# Patient Record
Sex: Female | Born: 1975
Health system: Southern US, Community
[De-identification: ages and names within clinical notes are randomized; demographics above are authoritative.]

## PROBLEM LIST (undated history)

## (undated) DIAGNOSIS — R7303 Prediabetes: Secondary | ICD-10-CM

## (undated) DIAGNOSIS — J309 Allergic rhinitis, unspecified: Secondary | ICD-10-CM

## (undated) DIAGNOSIS — K219 Gastro-esophageal reflux disease without esophagitis: Secondary | ICD-10-CM

## (undated) DIAGNOSIS — G4730 Sleep apnea, unspecified: Secondary | ICD-10-CM

## (undated) DIAGNOSIS — F419 Anxiety disorder, unspecified: Secondary | ICD-10-CM

## (undated) DIAGNOSIS — M3500 Sicca syndrome, unspecified: Secondary | ICD-10-CM

## (undated) DIAGNOSIS — A6000 Herpesviral infection of urogenital system, unspecified: Secondary | ICD-10-CM

## (undated) DIAGNOSIS — D649 Anemia, unspecified: Secondary | ICD-10-CM

## (undated) HISTORY — DX: Anemia, unspecified: D64.9

## (undated) HISTORY — DX: Allergic rhinitis, unspecified: J30.9

## (undated) HISTORY — DX: Anxiety disorder, unspecified: F41.9

## (undated) HISTORY — DX: Sleep apnea, unspecified: G47.30

## (undated) HISTORY — PX: APPENDECTOMY: SHX54

## (undated) HISTORY — DX: Gastro-esophageal reflux disease without esophagitis: K21.9

## (undated) HISTORY — DX: Prediabetes: R73.03

## (undated) HISTORY — DX: Sjogren syndrome, unspecified: M35.00

---

## 2014-02-19 DIAGNOSIS — R55 Syncope and collapse: Secondary | ICD-10-CM | POA: Insufficient documentation

## 2016-07-08 DIAGNOSIS — Z8249 Family history of ischemic heart disease and other diseases of the circulatory system: Secondary | ICD-10-CM | POA: Insufficient documentation

## 2016-07-08 DIAGNOSIS — R7302 Impaired glucose tolerance (oral): Secondary | ICD-10-CM | POA: Insufficient documentation

## 2016-09-17 DIAGNOSIS — R079 Chest pain, unspecified: Secondary | ICD-10-CM | POA: Insufficient documentation

## 2016-09-17 DIAGNOSIS — D649 Anemia, unspecified: Secondary | ICD-10-CM | POA: Diagnosis not present

## 2016-09-17 DIAGNOSIS — R030 Elevated blood-pressure reading, without diagnosis of hypertension: Secondary | ICD-10-CM | POA: Insufficient documentation

## 2016-09-17 DIAGNOSIS — N920 Excessive and frequent menstruation with regular cycle: Secondary | ICD-10-CM | POA: Diagnosis not present

## 2016-09-17 DIAGNOSIS — D500 Iron deficiency anemia secondary to blood loss (chronic): Secondary | ICD-10-CM | POA: Diagnosis not present

## 2016-10-07 DIAGNOSIS — F902 Attention-deficit hyperactivity disorder, combined type: Secondary | ICD-10-CM | POA: Diagnosis not present

## 2016-10-07 DIAGNOSIS — F341 Dysthymic disorder: Secondary | ICD-10-CM | POA: Diagnosis not present

## 2016-10-28 DIAGNOSIS — J019 Acute sinusitis, unspecified: Secondary | ICD-10-CM | POA: Diagnosis not present

## 2016-11-02 DIAGNOSIS — F902 Attention-deficit hyperactivity disorder, combined type: Secondary | ICD-10-CM | POA: Diagnosis not present

## 2016-11-02 DIAGNOSIS — F341 Dysthymic disorder: Secondary | ICD-10-CM | POA: Diagnosis not present

## 2016-11-17 DIAGNOSIS — J321 Chronic frontal sinusitis: Secondary | ICD-10-CM | POA: Diagnosis not present

## 2016-11-17 DIAGNOSIS — J323 Chronic sphenoidal sinusitis: Secondary | ICD-10-CM | POA: Diagnosis not present

## 2016-11-17 DIAGNOSIS — J322 Chronic ethmoidal sinusitis: Secondary | ICD-10-CM | POA: Diagnosis not present

## 2016-11-17 DIAGNOSIS — J320 Chronic maxillary sinusitis: Secondary | ICD-10-CM | POA: Diagnosis not present

## 2016-11-23 DIAGNOSIS — N920 Excessive and frequent menstruation with regular cycle: Secondary | ICD-10-CM | POA: Diagnosis not present

## 2016-11-23 DIAGNOSIS — D500 Iron deficiency anemia secondary to blood loss (chronic): Secondary | ICD-10-CM | POA: Diagnosis not present

## 2016-11-23 DIAGNOSIS — Z30430 Encounter for insertion of intrauterine contraceptive device: Secondary | ICD-10-CM | POA: Diagnosis not present

## 2016-11-23 DIAGNOSIS — Z3202 Encounter for pregnancy test, result negative: Secondary | ICD-10-CM | POA: Diagnosis not present

## 2017-01-06 DIAGNOSIS — T8339XA Other mechanical complication of intrauterine contraceptive device, initial encounter: Secondary | ICD-10-CM | POA: Diagnosis not present

## 2017-01-06 DIAGNOSIS — N938 Other specified abnormal uterine and vaginal bleeding: Secondary | ICD-10-CM | POA: Diagnosis not present

## 2017-01-25 DIAGNOSIS — Z975 Presence of (intrauterine) contraceptive device: Secondary | ICD-10-CM | POA: Diagnosis not present

## 2017-01-25 DIAGNOSIS — N920 Excessive and frequent menstruation with regular cycle: Secondary | ICD-10-CM | POA: Diagnosis not present

## 2017-01-25 DIAGNOSIS — D500 Iron deficiency anemia secondary to blood loss (chronic): Secondary | ICD-10-CM | POA: Diagnosis not present

## 2017-02-08 DIAGNOSIS — F902 Attention-deficit hyperactivity disorder, combined type: Secondary | ICD-10-CM | POA: Diagnosis not present

## 2017-02-08 DIAGNOSIS — F341 Dysthymic disorder: Secondary | ICD-10-CM | POA: Diagnosis not present

## 2017-02-16 DIAGNOSIS — F321 Major depressive disorder, single episode, moderate: Secondary | ICD-10-CM | POA: Diagnosis not present

## 2017-03-04 DIAGNOSIS — F341 Dysthymic disorder: Secondary | ICD-10-CM | POA: Diagnosis not present

## 2017-03-04 DIAGNOSIS — F902 Attention-deficit hyperactivity disorder, combined type: Secondary | ICD-10-CM | POA: Diagnosis not present

## 2017-03-22 DIAGNOSIS — R197 Diarrhea, unspecified: Secondary | ICD-10-CM | POA: Diagnosis not present

## 2017-03-22 DIAGNOSIS — R1084 Generalized abdominal pain: Secondary | ICD-10-CM | POA: Diagnosis not present

## 2017-03-22 DIAGNOSIS — K900 Celiac disease: Secondary | ICD-10-CM | POA: Diagnosis not present

## 2017-03-22 DIAGNOSIS — R1013 Epigastric pain: Secondary | ICD-10-CM | POA: Diagnosis not present

## 2017-03-31 DIAGNOSIS — F321 Major depressive disorder, single episode, moderate: Secondary | ICD-10-CM | POA: Diagnosis not present

## 2017-04-07 DIAGNOSIS — Z8249 Family history of ischemic heart disease and other diseases of the circulatory system: Secondary | ICD-10-CM | POA: Diagnosis not present

## 2017-04-07 DIAGNOSIS — R0600 Dyspnea, unspecified: Secondary | ICD-10-CM | POA: Insufficient documentation

## 2017-04-07 DIAGNOSIS — R079 Chest pain, unspecified: Secondary | ICD-10-CM | POA: Diagnosis not present

## 2017-04-07 DIAGNOSIS — I10 Essential (primary) hypertension: Secondary | ICD-10-CM | POA: Diagnosis not present

## 2017-04-07 DIAGNOSIS — R0609 Other forms of dyspnea: Secondary | ICD-10-CM | POA: Insufficient documentation

## 2017-04-07 DIAGNOSIS — E6609 Other obesity due to excess calories: Secondary | ICD-10-CM | POA: Diagnosis not present

## 2017-04-08 DIAGNOSIS — F900 Attention-deficit hyperactivity disorder, predominantly inattentive type: Secondary | ICD-10-CM | POA: Diagnosis not present

## 2017-04-08 DIAGNOSIS — F341 Dysthymic disorder: Secondary | ICD-10-CM | POA: Diagnosis not present

## 2017-04-13 DIAGNOSIS — F321 Major depressive disorder, single episode, moderate: Secondary | ICD-10-CM | POA: Diagnosis not present

## 2017-04-14 DIAGNOSIS — G4733 Obstructive sleep apnea (adult) (pediatric): Secondary | ICD-10-CM | POA: Diagnosis not present

## 2017-04-18 DIAGNOSIS — Z8249 Family history of ischemic heart disease and other diseases of the circulatory system: Secondary | ICD-10-CM | POA: Diagnosis not present

## 2017-04-18 DIAGNOSIS — R079 Chest pain, unspecified: Secondary | ICD-10-CM | POA: Diagnosis not present

## 2017-04-18 DIAGNOSIS — R0609 Other forms of dyspnea: Secondary | ICD-10-CM | POA: Diagnosis not present

## 2017-04-18 DIAGNOSIS — I10 Essential (primary) hypertension: Secondary | ICD-10-CM | POA: Diagnosis not present

## 2017-04-22 DIAGNOSIS — F321 Major depressive disorder, single episode, moderate: Secondary | ICD-10-CM | POA: Diagnosis not present

## 2017-04-26 DIAGNOSIS — Z8249 Family history of ischemic heart disease and other diseases of the circulatory system: Secondary | ICD-10-CM | POA: Diagnosis not present

## 2017-04-26 DIAGNOSIS — F321 Major depressive disorder, single episode, moderate: Secondary | ICD-10-CM | POA: Diagnosis not present

## 2017-04-26 DIAGNOSIS — R072 Precordial pain: Secondary | ICD-10-CM | POA: Diagnosis not present

## 2017-04-26 DIAGNOSIS — R7302 Impaired glucose tolerance (oral): Secondary | ICD-10-CM | POA: Diagnosis not present

## 2017-04-26 DIAGNOSIS — R9439 Abnormal result of other cardiovascular function study: Secondary | ICD-10-CM | POA: Insufficient documentation

## 2017-04-26 DIAGNOSIS — I10 Essential (primary) hypertension: Secondary | ICD-10-CM | POA: Diagnosis not present

## 2017-04-28 DIAGNOSIS — I10 Essential (primary) hypertension: Secondary | ICD-10-CM | POA: Diagnosis not present

## 2017-04-28 DIAGNOSIS — R0789 Other chest pain: Secondary | ICD-10-CM | POA: Diagnosis not present

## 2017-04-28 DIAGNOSIS — E6609 Other obesity due to excess calories: Secondary | ICD-10-CM | POA: Diagnosis not present

## 2017-04-28 DIAGNOSIS — R002 Palpitations: Secondary | ICD-10-CM | POA: Diagnosis not present

## 2017-05-02 DIAGNOSIS — F321 Major depressive disorder, single episode, moderate: Secondary | ICD-10-CM | POA: Diagnosis not present

## 2017-05-06 DIAGNOSIS — K2960 Other gastritis without bleeding: Secondary | ICD-10-CM | POA: Diagnosis not present

## 2017-05-06 DIAGNOSIS — Z9049 Acquired absence of other specified parts of digestive tract: Secondary | ICD-10-CM | POA: Diagnosis not present

## 2017-05-06 DIAGNOSIS — R079 Chest pain, unspecified: Secondary | ICD-10-CM | POA: Diagnosis not present

## 2017-05-06 DIAGNOSIS — E6609 Other obesity due to excess calories: Secondary | ICD-10-CM | POA: Diagnosis not present

## 2017-05-06 DIAGNOSIS — R5383 Other fatigue: Secondary | ICD-10-CM | POA: Diagnosis not present

## 2017-05-06 DIAGNOSIS — Z975 Presence of (intrauterine) contraceptive device: Secondary | ICD-10-CM | POA: Diagnosis not present

## 2017-05-06 DIAGNOSIS — R0789 Other chest pain: Secondary | ICD-10-CM | POA: Diagnosis not present

## 2017-05-06 DIAGNOSIS — K900 Celiac disease: Secondary | ICD-10-CM | POA: Diagnosis not present

## 2017-05-06 DIAGNOSIS — E8881 Metabolic syndrome: Secondary | ICD-10-CM | POA: Diagnosis not present

## 2017-05-06 DIAGNOSIS — Z6841 Body Mass Index (BMI) 40.0 and over, adult: Secondary | ICD-10-CM | POA: Diagnosis not present

## 2017-05-06 DIAGNOSIS — R002 Palpitations: Secondary | ICD-10-CM | POA: Diagnosis not present

## 2017-05-06 DIAGNOSIS — R030 Elevated blood-pressure reading, without diagnosis of hypertension: Secondary | ICD-10-CM | POA: Diagnosis not present

## 2017-05-06 DIAGNOSIS — I10 Essential (primary) hypertension: Secondary | ICD-10-CM | POA: Diagnosis not present

## 2017-05-06 DIAGNOSIS — R6884 Jaw pain: Secondary | ICD-10-CM | POA: Diagnosis not present

## 2017-05-06 DIAGNOSIS — F419 Anxiety disorder, unspecified: Secondary | ICD-10-CM | POA: Diagnosis not present

## 2017-05-06 DIAGNOSIS — K219 Gastro-esophageal reflux disease without esophagitis: Secondary | ICD-10-CM | POA: Diagnosis not present

## 2017-05-06 DIAGNOSIS — R0602 Shortness of breath: Secondary | ICD-10-CM | POA: Diagnosis not present

## 2017-05-13 DIAGNOSIS — F321 Major depressive disorder, single episode, moderate: Secondary | ICD-10-CM | POA: Diagnosis not present

## 2017-05-20 DIAGNOSIS — F321 Major depressive disorder, single episode, moderate: Secondary | ICD-10-CM | POA: Diagnosis not present

## 2017-05-30 DIAGNOSIS — F321 Major depressive disorder, single episode, moderate: Secondary | ICD-10-CM | POA: Diagnosis not present

## 2017-07-25 ENCOUNTER — Encounter: Payer: Self-pay | Admitting: Family Medicine

## 2017-07-25 DIAGNOSIS — Z0000 Encounter for general adult medical examination without abnormal findings: Secondary | ICD-10-CM | POA: Diagnosis not present

## 2017-07-25 DIAGNOSIS — N921 Excessive and frequent menstruation with irregular cycle: Secondary | ICD-10-CM | POA: Diagnosis not present

## 2017-07-25 DIAGNOSIS — Z01419 Encounter for gynecological examination (general) (routine) without abnormal findings: Secondary | ICD-10-CM | POA: Diagnosis not present

## 2017-07-25 DIAGNOSIS — Z6841 Body Mass Index (BMI) 40.0 and over, adult: Secondary | ICD-10-CM | POA: Diagnosis not present

## 2017-07-27 LAB — HM PAP SMEAR: HM Pap smear: NORMAL

## 2017-08-01 DIAGNOSIS — F321 Major depressive disorder, single episode, moderate: Secondary | ICD-10-CM | POA: Diagnosis not present

## 2017-08-02 DIAGNOSIS — M7631 Iliotibial band syndrome, right leg: Secondary | ICD-10-CM | POA: Diagnosis not present

## 2017-08-02 DIAGNOSIS — M7061 Trochanteric bursitis, right hip: Secondary | ICD-10-CM | POA: Diagnosis not present

## 2017-08-02 DIAGNOSIS — M25551 Pain in right hip: Secondary | ICD-10-CM | POA: Diagnosis not present

## 2017-08-03 DIAGNOSIS — M7631 Iliotibial band syndrome, right leg: Secondary | ICD-10-CM | POA: Diagnosis not present

## 2017-08-05 DIAGNOSIS — Z713 Dietary counseling and surveillance: Secondary | ICD-10-CM | POA: Diagnosis not present

## 2017-08-08 DIAGNOSIS — M7061 Trochanteric bursitis, right hip: Secondary | ICD-10-CM | POA: Diagnosis not present

## 2017-08-08 DIAGNOSIS — M25551 Pain in right hip: Secondary | ICD-10-CM | POA: Diagnosis not present

## 2017-09-07 DIAGNOSIS — M545 Low back pain: Secondary | ICD-10-CM | POA: Diagnosis not present

## 2017-09-07 DIAGNOSIS — M9913 Subluxation complex (vertebral) of lumbar region: Secondary | ICD-10-CM | POA: Diagnosis not present

## 2017-09-07 DIAGNOSIS — M62830 Muscle spasm of back: Secondary | ICD-10-CM | POA: Diagnosis not present

## 2017-09-07 DIAGNOSIS — M9902 Segmental and somatic dysfunction of thoracic region: Secondary | ICD-10-CM | POA: Diagnosis not present

## 2017-09-09 DIAGNOSIS — M62830 Muscle spasm of back: Secondary | ICD-10-CM | POA: Diagnosis not present

## 2017-09-09 DIAGNOSIS — M9913 Subluxation complex (vertebral) of lumbar region: Secondary | ICD-10-CM | POA: Diagnosis not present

## 2017-09-09 DIAGNOSIS — M9902 Segmental and somatic dysfunction of thoracic region: Secondary | ICD-10-CM | POA: Diagnosis not present

## 2017-09-09 DIAGNOSIS — M545 Low back pain: Secondary | ICD-10-CM | POA: Diagnosis not present

## 2017-09-12 DIAGNOSIS — M545 Low back pain: Secondary | ICD-10-CM | POA: Diagnosis not present

## 2017-09-12 DIAGNOSIS — M9902 Segmental and somatic dysfunction of thoracic region: Secondary | ICD-10-CM | POA: Diagnosis not present

## 2017-09-12 DIAGNOSIS — M9913 Subluxation complex (vertebral) of lumbar region: Secondary | ICD-10-CM | POA: Diagnosis not present

## 2017-09-12 DIAGNOSIS — M62830 Muscle spasm of back: Secondary | ICD-10-CM | POA: Diagnosis not present

## 2017-09-13 DIAGNOSIS — M62830 Muscle spasm of back: Secondary | ICD-10-CM | POA: Diagnosis not present

## 2017-09-13 DIAGNOSIS — M9902 Segmental and somatic dysfunction of thoracic region: Secondary | ICD-10-CM | POA: Diagnosis not present

## 2017-09-13 DIAGNOSIS — M9913 Subluxation complex (vertebral) of lumbar region: Secondary | ICD-10-CM | POA: Diagnosis not present

## 2017-09-13 DIAGNOSIS — M545 Low back pain: Secondary | ICD-10-CM | POA: Diagnosis not present

## 2017-09-14 DIAGNOSIS — M9902 Segmental and somatic dysfunction of thoracic region: Secondary | ICD-10-CM | POA: Diagnosis not present

## 2017-09-14 DIAGNOSIS — M9913 Subluxation complex (vertebral) of lumbar region: Secondary | ICD-10-CM | POA: Diagnosis not present

## 2017-09-14 DIAGNOSIS — M545 Low back pain: Secondary | ICD-10-CM | POA: Diagnosis not present

## 2017-09-14 DIAGNOSIS — M62830 Muscle spasm of back: Secondary | ICD-10-CM | POA: Diagnosis not present

## 2017-10-29 DIAGNOSIS — F321 Major depressive disorder, single episode, moderate: Secondary | ICD-10-CM | POA: Diagnosis not present

## 2017-11-10 ENCOUNTER — Encounter: Payer: Self-pay | Admitting: Family Medicine

## 2017-11-10 DIAGNOSIS — D259 Leiomyoma of uterus, unspecified: Secondary | ICD-10-CM | POA: Diagnosis not present

## 2017-11-10 DIAGNOSIS — N921 Excessive and frequent menstruation with irregular cycle: Secondary | ICD-10-CM | POA: Diagnosis not present

## 2017-11-10 DIAGNOSIS — D251 Intramural leiomyoma of uterus: Secondary | ICD-10-CM | POA: Diagnosis not present

## 2017-11-10 DIAGNOSIS — N938 Other specified abnormal uterine and vaginal bleeding: Secondary | ICD-10-CM | POA: Diagnosis not present

## 2017-11-10 DIAGNOSIS — T8339XA Other mechanical complication of intrauterine contraceptive device, initial encounter: Secondary | ICD-10-CM | POA: Diagnosis not present

## 2017-12-15 DIAGNOSIS — K080 Exfoliation of teeth due to systemic causes: Secondary | ICD-10-CM | POA: Diagnosis not present

## 2018-01-05 DIAGNOSIS — R002 Palpitations: Secondary | ICD-10-CM | POA: Diagnosis not present

## 2018-01-05 DIAGNOSIS — K900 Celiac disease: Secondary | ICD-10-CM | POA: Diagnosis not present

## 2018-01-05 DIAGNOSIS — R0789 Other chest pain: Secondary | ICD-10-CM | POA: Diagnosis not present

## 2018-01-05 DIAGNOSIS — R1084 Generalized abdominal pain: Secondary | ICD-10-CM | POA: Diagnosis not present

## 2018-01-05 DIAGNOSIS — I10 Essential (primary) hypertension: Secondary | ICD-10-CM | POA: Diagnosis not present

## 2018-01-05 DIAGNOSIS — R079 Chest pain, unspecified: Secondary | ICD-10-CM | POA: Diagnosis not present

## 2018-01-05 DIAGNOSIS — R140 Abdominal distension (gaseous): Secondary | ICD-10-CM | POA: Diagnosis not present

## 2018-01-05 DIAGNOSIS — R1013 Epigastric pain: Secondary | ICD-10-CM | POA: Diagnosis not present

## 2018-01-23 DIAGNOSIS — K080 Exfoliation of teeth due to systemic causes: Secondary | ICD-10-CM | POA: Diagnosis not present

## 2018-01-30 DIAGNOSIS — R002 Palpitations: Secondary | ICD-10-CM | POA: Diagnosis not present

## 2018-01-31 DIAGNOSIS — R197 Diarrhea, unspecified: Secondary | ICD-10-CM | POA: Diagnosis not present

## 2018-01-31 DIAGNOSIS — R194 Change in bowel habit: Secondary | ICD-10-CM | POA: Diagnosis not present

## 2018-01-31 DIAGNOSIS — D509 Iron deficiency anemia, unspecified: Secondary | ICD-10-CM | POA: Diagnosis not present

## 2018-01-31 DIAGNOSIS — D130 Benign neoplasm of esophagus: Secondary | ICD-10-CM | POA: Diagnosis not present

## 2018-01-31 DIAGNOSIS — K5730 Diverticulosis of large intestine without perforation or abscess without bleeding: Secondary | ICD-10-CM | POA: Diagnosis not present

## 2018-01-31 DIAGNOSIS — K317 Polyp of stomach and duodenum: Secondary | ICD-10-CM | POA: Diagnosis not present

## 2018-01-31 DIAGNOSIS — K2980 Duodenitis without bleeding: Secondary | ICD-10-CM | POA: Diagnosis not present

## 2018-01-31 LAB — HM COLONOSCOPY

## 2018-02-01 DIAGNOSIS — B354 Tinea corporis: Secondary | ICD-10-CM | POA: Diagnosis not present

## 2018-02-01 DIAGNOSIS — R21 Rash and other nonspecific skin eruption: Secondary | ICD-10-CM | POA: Diagnosis not present

## 2018-02-01 DIAGNOSIS — B351 Tinea unguium: Secondary | ICD-10-CM | POA: Diagnosis not present

## 2018-02-10 DIAGNOSIS — I10 Essential (primary) hypertension: Secondary | ICD-10-CM | POA: Diagnosis not present

## 2018-02-10 DIAGNOSIS — G4733 Obstructive sleep apnea (adult) (pediatric): Secondary | ICD-10-CM | POA: Diagnosis not present

## 2018-02-10 DIAGNOSIS — R002 Palpitations: Secondary | ICD-10-CM | POA: Diagnosis not present

## 2018-04-17 DIAGNOSIS — I471 Supraventricular tachycardia: Secondary | ICD-10-CM | POA: Diagnosis not present

## 2018-05-04 DIAGNOSIS — G4733 Obstructive sleep apnea (adult) (pediatric): Secondary | ICD-10-CM | POA: Diagnosis not present

## 2018-06-06 DIAGNOSIS — R002 Palpitations: Secondary | ICD-10-CM | POA: Diagnosis not present

## 2018-06-06 DIAGNOSIS — I10 Essential (primary) hypertension: Secondary | ICD-10-CM | POA: Diagnosis not present

## 2018-06-06 DIAGNOSIS — G4733 Obstructive sleep apnea (adult) (pediatric): Secondary | ICD-10-CM | POA: Diagnosis not present

## 2018-06-13 DIAGNOSIS — I10 Essential (primary) hypertension: Secondary | ICD-10-CM | POA: Diagnosis not present

## 2018-06-13 DIAGNOSIS — R0683 Snoring: Secondary | ICD-10-CM | POA: Diagnosis not present

## 2018-06-13 DIAGNOSIS — G4733 Obstructive sleep apnea (adult) (pediatric): Secondary | ICD-10-CM | POA: Diagnosis not present

## 2018-06-13 DIAGNOSIS — R5383 Other fatigue: Secondary | ICD-10-CM | POA: Diagnosis not present

## 2018-06-22 DIAGNOSIS — R55 Syncope and collapse: Secondary | ICD-10-CM | POA: Diagnosis not present

## 2018-06-22 DIAGNOSIS — G4733 Obstructive sleep apnea (adult) (pediatric): Secondary | ICD-10-CM | POA: Diagnosis not present

## 2018-06-22 DIAGNOSIS — I10 Essential (primary) hypertension: Secondary | ICD-10-CM | POA: Diagnosis not present

## 2018-06-22 DIAGNOSIS — R9439 Abnormal result of other cardiovascular function study: Secondary | ICD-10-CM | POA: Diagnosis not present

## 2018-06-23 DIAGNOSIS — I10 Essential (primary) hypertension: Secondary | ICD-10-CM | POA: Diagnosis not present

## 2018-06-23 DIAGNOSIS — Z6841 Body Mass Index (BMI) 40.0 and over, adult: Secondary | ICD-10-CM | POA: Diagnosis not present

## 2018-06-29 DIAGNOSIS — Z0130 Encounter for examination of blood pressure without abnormal findings: Secondary | ICD-10-CM | POA: Diagnosis not present

## 2018-06-29 DIAGNOSIS — H66009 Acute suppurative otitis media without spontaneous rupture of ear drum, unspecified ear: Secondary | ICD-10-CM | POA: Diagnosis not present

## 2018-06-29 DIAGNOSIS — J029 Acute pharyngitis, unspecified: Secondary | ICD-10-CM | POA: Diagnosis not present

## 2018-06-29 DIAGNOSIS — J019 Acute sinusitis, unspecified: Secondary | ICD-10-CM | POA: Diagnosis not present

## 2018-07-02 DIAGNOSIS — G4733 Obstructive sleep apnea (adult) (pediatric): Secondary | ICD-10-CM | POA: Diagnosis not present

## 2018-07-14 DIAGNOSIS — G4733 Obstructive sleep apnea (adult) (pediatric): Secondary | ICD-10-CM | POA: Diagnosis not present

## 2018-08-04 DIAGNOSIS — G4733 Obstructive sleep apnea (adult) (pediatric): Secondary | ICD-10-CM | POA: Diagnosis not present

## 2018-08-13 DIAGNOSIS — G4733 Obstructive sleep apnea (adult) (pediatric): Secondary | ICD-10-CM | POA: Diagnosis not present

## 2018-08-16 DIAGNOSIS — K080 Exfoliation of teeth due to systemic causes: Secondary | ICD-10-CM | POA: Diagnosis not present

## 2018-09-01 ENCOUNTER — Encounter: Payer: Self-pay | Admitting: Family Medicine

## 2018-09-01 ENCOUNTER — Ambulatory Visit: Payer: Federal, State, Local not specified - PPO | Admitting: Family Medicine

## 2018-09-01 VITALS — BP 140/86 | HR 61 | Temp 98.2°F | Resp 16 | Ht 63.5 in | Wt 286.8 lb

## 2018-09-01 DIAGNOSIS — K219 Gastro-esophageal reflux disease without esophagitis: Secondary | ICD-10-CM

## 2018-09-01 DIAGNOSIS — E559 Vitamin D deficiency, unspecified: Secondary | ICD-10-CM

## 2018-09-01 DIAGNOSIS — R7303 Prediabetes: Secondary | ICD-10-CM | POA: Diagnosis not present

## 2018-09-01 DIAGNOSIS — J301 Allergic rhinitis due to pollen: Secondary | ICD-10-CM

## 2018-09-01 DIAGNOSIS — G4733 Obstructive sleep apnea (adult) (pediatric): Secondary | ICD-10-CM | POA: Diagnosis not present

## 2018-09-01 DIAGNOSIS — R002 Palpitations: Secondary | ICD-10-CM | POA: Insufficient documentation

## 2018-09-01 DIAGNOSIS — T7840XA Allergy, unspecified, initial encounter: Secondary | ICD-10-CM | POA: Insufficient documentation

## 2018-09-01 DIAGNOSIS — F419 Anxiety disorder, unspecified: Secondary | ICD-10-CM | POA: Diagnosis not present

## 2018-09-01 DIAGNOSIS — G4730 Sleep apnea, unspecified: Secondary | ICD-10-CM | POA: Insufficient documentation

## 2018-09-01 DIAGNOSIS — J309 Allergic rhinitis, unspecified: Secondary | ICD-10-CM | POA: Insufficient documentation

## 2018-09-01 DIAGNOSIS — D649 Anemia, unspecified: Secondary | ICD-10-CM | POA: Insufficient documentation

## 2018-09-01 DIAGNOSIS — D500 Iron deficiency anemia secondary to blood loss (chronic): Secondary | ICD-10-CM

## 2018-09-01 DIAGNOSIS — I10 Essential (primary) hypertension: Secondary | ICD-10-CM

## 2018-09-01 NOTE — Progress Notes (Signed)
Patient: Brenda Ross, Female    DOB: [birth year], 42 y.o.   MRN: [id number] Visit Date: 09/01/2018  Today's Provider: Lavon Paganini, MD   Chief Complaint  Patient presents with  . New Patient (Initial Visit)   Subjective:  I, Brenda Ross, CMA, am acting as a scribe for Lavon Paganini, MD.   New Patient: Brenda Ross is a 42 year old female who presents today to [facility] as a new patient. Patient transferring from [facility].   OSA. Using CPAP with good compliance.  States that she needs a Letter of medical necessity for new hose for CPAP - will send form.  Melted last hose with hair dryer - trying to dry it out.  GERD: Taking ranitidine and protonix.  Well controlled on those, but bad symptoms if she misses a dose.  She watches diet and avoids foods that cause increased reflux.  Followed by [facility].  Had colonoscopy and EGD within the last year  Would like blood work done today.  She has significant fatigue all day.  Has h/o anemia 2/2 menorrhagia.  This is somewhat better after having Mirena IUD placed 2 years ago.  She is followed by OB/GYN (Dr Marvel Plan) in [place].  She states she has had recent Pap smears and mammograms.  History states that she has DM.  She states she has only ever been diagnosed with pre-diabetes.  She has been able to lower A1c with diet and exercise.  She has never taken a medication for diabetes.  She plans to start an online weight loss program.  She would like to lose 150 lbs.  Allergic rhinitis: Previously seeing ENT (Dr Collene Mares in [facility]) - had wanted to do surgery to fix deviated septum. Frequently gets pressure headaches. Usually takes claritin, Uses flonase occasionally.  Anxiety: She has never formally been diagnosed with anxiety.  She states that there is a lot of family history of anxiety and ADHD.  She believes she may also have ADHD and compulsive tendencies.  She previously took Wellbutrin and it worsened  her anxiety.  She is looking for a psychiatrist and therapist in the area.  She does not need a referral, and is trying to research who will be best for her to see.  Palpitations: Patient was previously followed by Dr. Elisabeth Cara at [facility].  She has started seeing Dr. Ubaldo Glassing since moving to the area.  She states she was found to have SVT and PVCs on an event monitor and was started on [place].  This has helped her palpitations significantly.  She is also taking losartan 100 mg daily for hypertension.  She denies any chest pain, shortness of breath, visual changes, headaches.  Stress Echo 07/20/16 - EF 55-60%, trivial mitral regurg, trivial tricuspid regurg, trivial pulmonic regurg, no pericardial effusion, ST segment response to exercise (5 min) normal, stress echo was nondiagnostic due to inadequate heart rate response, with peak stress the global left ventricular function becomes hyperdynamic. Lexiscan stress test 04/25/2017 was nondiagnostic there was a fixed and slightly reversible perfusion defect that suggested possible prior infarction with a small area of ischemia or artifact due to body habitus. Cardiac catheterization 05/06/2017 showed no apparent coronary artery disease and normal LV filling pressures -----------------------------------------------------------------   Review of Systems  Constitutional: Negative.   HENT: Positive for ear pain, sinus pressure and trouble swallowing.   Eyes: Positive for photophobia and discharge.  Respiratory: Positive for apnea.   Cardiovascular: Positive  for palpitations.  Gastrointestinal: Positive for abdominal distention, constipation and diarrhea.  Endocrine: Negative.   Genitourinary: Negative.   Musculoskeletal: Positive for arthralgias, neck pain and neck stiffness.  Skin: Negative.   Allergic/Immunologic: Positive for food allergies.  Neurological: Negative.   Hematological: Negative.   Psychiatric/Behavioral: Positive for decreased  concentration. The patient is nervous/anxious and is hyperactive.     Social History      She  reports that she quit smoking about 22 years ago. Her smoking use included cigarettes. She has a 3.00 pack-year smoking history. She has never used smokeless tobacco. She reports that she drinks about 7.0 - 14.0 standard drinks of alcohol per week. She reports that she does not use drugs.       Social History   Socioeconomic History  . Marital status: Married    Spouse name: Not on file  . Number of children: 1  . Years of education: Not on file  . Highest education level: Not on file  Occupational History  . Not on file  Social Needs  . Financial resource strain: Not on file  . Food insecurity:    Worry: Not on file    Inability: Not on file  . Transportation needs:    Medical: Not on file    Non-medical: Not on file  Tobacco Use  . Smoking status: Former Smoker    Packs/day: 1.00    Years: 3.00    Pack years: 3.00    Types: Cigarettes    Last attempt to quit: 01/14/1996    Years since quitting: 22.6  . Smokeless tobacco: Never Used  Substance and Sexual Activity  . Alcohol use: Yes    Alcohol/week: 7.0 - 14.0 standard drinks    Types: 7 - 14 Glasses of wine per week    Frequency: Never  . Drug use: Never  . Sexual activity: Yes    Partners: Male    Birth control/protection: IUD  Lifestyle  . Physical activity:    Days per week: Not on file    Minutes per session: Not on file  . Stress: Not on file  Relationships  . Social connections:    Talks on phone: Not on file    Gets together: Not on file    Attends religious service: Not on file    Active member of club or organization: Not on file    Attends meetings of clubs or organizations: Not on file    Relationship status: Not on file  Other Topics Concern  . Not on file  Social History Narrative  . Not on file    Past Medical History:  Diagnosis Date  . Allergic rhinitis   . Anemia   . Anxiety   . GERD  (gastroesophageal reflux disease)   . Prediabetes   . Sleep apnea      Patient Active Problem List   Diagnosis Date Noted  . Essential hypertension 09/01/2018  . Avitaminosis D 09/01/2018  . Morbid obesity ([place]) 09/01/2018  . Palpitations 09/01/2018  . GERD (gastroesophageal reflux disease)   . Prediabetes   . Sleep apnea   . Anxiety   . Anemia   . Allergic rhinitis     Past Surgical History:  Procedure Laterality Date  . APPENDECTOMY      Family History        Family Status  Relation Name Status  . Mother  Alive  . Father  Alive  . Brother  Alive  . Brother  Alive  .  Brother  Alive  . Neg Hx  (Not Specified)        Her family history includes ADD / ADHD in her brother and brother; Diabetes in her brother, father, and mother; Heart disease in her father and mother; Hypertension in her father and mother; Prostate cancer in her father. There is no history of Breast cancer, Colon cancer, or Ovarian cancer.      Allergies  Allergen Reactions  . Eggs Or Egg-Derived Products Diarrhea  . Gluten Meal Diarrhea    Celiac's disease Celiac's disease Celiac's disease Celiac's disease Celiac's disease Celiac's disease Celiac's disease   . Pea Extract Diarrhea     Current Outpatient Medications:  .  CARTIA XT 120 MG 24 hr capsule, Take 1 capsule by mouth daily., Disp: , Rfl:  .  Cholecalciferol (VITAMIN D PO), Take by mouth., Disp: , Rfl:  .  losartan (COZAAR) 100 MG tablet, Take 1 tablet by mouth daily., Disp: , Rfl:  .  pantoprazole (PROTONIX) 40 MG tablet, Take 1 tablet by mouth 2 (two) times daily., Disp: , Rfl:  .  ranitidine (ZANTAC) 150 MG tablet, Take 1 tablet by mouth 2 (two) times daily., Disp: , Rfl:    Patient Care Team: Virginia Crews, MD as PCP - General (Family Medicine)      Objective:   Vitals: BP 140/86 (BP Location: Left Arm, Patient Position: Sitting, Cuff Size: Large)   Pulse 61   Temp 98.2 F (36.8 C) (Oral)   Resp 16   Ht 5' 3.5"  (1.613 m)   Wt 286 lb 12.8 oz (130.1 kg)   SpO2 97%   BMI 50.01 kg/m    Vitals:   09/01/18 1431  BP: 140/86  Pulse: 61  Resp: 16  Temp: 98.2 F (36.8 C)  TempSrc: Oral  SpO2: 97%  Weight: 286 lb 12.8 oz (130.1 kg)  Height: 5' 3.5" (1.613 m)     Physical Exam  Constitutional: She is oriented to person, place, and time. She appears well-developed and well-nourished. No distress.  HENT:  Head: Normocephalic and atraumatic.  Right Ear: External ear normal.  Left Ear: External ear normal.  Nose: Nose normal.  Mouth/Throat: Oropharynx is clear and moist.  Eyes: Pupils are equal, round, and reactive to light. Conjunctivae and EOM are normal. No scleral icterus.  Neck: Neck supple. No thyromegaly present.  Cardiovascular: Normal rate, regular rhythm, normal heart sounds and intact distal pulses.  No murmur heard. Pulmonary/Chest: Effort normal and breath sounds normal. No respiratory distress. She has no wheezes. She has no rales.  Abdominal: Soft. Bowel sounds are normal. She exhibits no distension. There is no tenderness. There is no rebound and no guarding.  Musculoskeletal: She exhibits no edema or deformity.  Lymphadenopathy:    She has no cervical adenopathy.  Neurological: She is alert and oriented to person, place, and time.  Skin: Skin is warm and dry. Capillary refill takes less than 2 seconds. No rash noted.  Psychiatric: She has a normal mood and affect. Her behavior is normal.  Vitals reviewed.    Depression Screen PHQ 2/9 Scores 09/01/2018  PHQ - 2 Score 0  PHQ- 9 Score 8     Assessment & Plan:     Establish care  Exercise Activities and Dietary recommendations Goals   None      There is no immunization history on file for this patient.  Health Maintenance  Topic Date Due  . HIV Screening  05/17/1990  . TETANUS/TDAP  05/17/1994  . PAP SMEAR  05/17/1996  . INFLUENZA VACCINE  04/13/2019 (Originally 07/13/2018)     Discussed health benefits of  physical activity, and encouraged her to engage in regular exercise appropriate for her age and condition.    We will request records from GYN, previous PCP, ENT, and GI.  This will update patient's health maintenance, including colonoscopy, Pap smear, mammogram, vaccinations, and blood work --------------------------------------------------------------------  Problem List Items Addressed This Visit      Cardiovascular and Mediastinum   Essential hypertension    Well-controlled today Continue losartan 100 mg daily Check CMP      Relevant Medications   losartan (COZAAR) 100 MG tablet   CARTIA XT 120 MG 24 hr capsule   Other Relevant Orders   Comprehensive metabolic panel     Respiratory   Sleep apnea    Reportedly well controlled with good compliance with CPAP machine We will complete paperwork for medical necessity of new hose for her CPAP when this is available      Relevant Orders   TSH   Allergic rhinitis    Chronic, fair control Continue OTC antihistamine and Flonase        Digestive   GERD (gastroesophageal reflux disease) - Primary    Chronic and stable Continue PPI and H2 blocker Followed by GI We will request the records to evaluate last EGD and colonoscopy      Relevant Medications   ranitidine (ZANTAC) 150 MG tablet   pantoprazole (PROTONIX) 40 MG tablet     Other   Prediabetes    Not currently on any medications Discussed low-carb diet and exercise Recheck A1c      Relevant Orders   Hemoglobin A1c   Anxiety    No formal diagnosis Patient has anxious mood/affect, but is unclear if this is affecting her lifestyle She is not currently on any medications She is seeking out a therapist and a psychiatrist GAD-7 screening at next visit      Anemia    Unknown what last hemoglobin level was Her chronic blood loss has improved since having IUD placed Recheck iron panel and CBC today      Relevant Orders   CBC w/Diff/Platelet   Fe+TIBC+Fer    Avitaminosis D    Continue current supplement Recheck vitamin D level      Relevant Orders   VITAMIN D 25 Hydroxy (Vit-D Deficiency, Fractures)   Morbid obesity ([place])    Discussed importance of healthy weight management Discussed diet and exercise Her morbid obesity is complicated by GERD, hypertension, and prediabetes      Relevant Orders   Lipid panel   Palpitations    Chronic and stable Continue Cartia XT 120 mg daily Followed by cardiology      Relevant Orders   TSH       Return in about 6 months (around 03/02/2019) for CPE.  Addressed extensive list of chronic and acute medical problems today requiring extensive time in counseling and coordination of care.  Over half of this 60 minute visit were spent in counseling and coordinating care of multiple medical problems.    The entirety of the information documented in the History of Present Illness, Review of Systems and Physical Exam were personally obtained by me. Portions of this information were initially documented by Lendon Ka, CMA and reviewed by me for thoroughness and accuracy.    Virginia Crews, MD, MPH [facility] 09/01/2018 4:33 PM

## 2018-09-01 NOTE — Patient Instructions (Signed)
DASH Eating Plan DASH stands for "Dietary Approaches to Stop Hypertension." The DASH eating plan is a healthy eating plan that has been shown to reduce high blood pressure (hypertension). It may also reduce your risk for type 2 diabetes, heart disease, and stroke. The DASH eating plan may also help with weight loss. What are tips for following this plan? General guidelines  Avoid eating more than 2,300 mg (milligrams) of salt (sodium) a day. If you have hypertension, you may need to reduce your sodium intake to 1,500 mg a day.  Limit alcohol intake to no more than 1 drink a day for nonpregnant women and 2 drinks a day for men. One drink equals 12 oz of beer, 5 oz of wine, or 1 oz of hard liquor.  Work with your health care provider to maintain a healthy body weight or to lose weight. Ask what an ideal weight is for you.  Get at least 30 minutes of exercise that causes your heart to beat faster (aerobic exercise) most days of the week. Activities may include walking, swimming, or biking.  Work with your health care provider or diet and nutrition specialist (dietitian) to adjust your eating plan to your individual calorie needs. Reading food labels  Check food labels for the amount of sodium per serving. Choose foods with less than 5 percent of the Daily Value of sodium. Generally, foods with less than 300 mg of sodium per serving fit into this eating plan.  To find whole grains, look for the word "whole" as the first word in the ingredient list. Shopping  Buy products labeled as "low-sodium" or "no salt added."  Buy fresh foods. Avoid canned foods and premade or frozen meals. Cooking  Avoid adding salt when cooking. Use salt-free seasonings or herbs instead of table salt or sea salt. Check with your health care provider or pharmacist before using salt substitutes.  Do not fry foods. Cook foods using healthy methods such as baking, boiling, grilling, and broiling instead.  Cook with  heart-healthy oils, such as olive, canola, soybean, or sunflower oil. Meal planning   Eat a balanced diet that includes: ? 5 or more servings of fruits and vegetables each day. At each meal, try to fill half of your plate with fruits and vegetables. ? Up to 6-8 servings of whole grains each day. ? Less than 6 oz of lean meat, poultry, or fish each day. A 3-oz serving of meat is about the same size as a deck of cards. One egg equals 1 oz. ? 2 servings of low-fat dairy each day. ? A serving of nuts, seeds, or beans 5 times each week. ? Heart-healthy fats. Healthy fats called Omega-3 fatty acids are found in foods such as flaxseeds and coldwater fish, like sardines, salmon, and mackerel.  Limit how much you eat of the following: ? Canned or prepackaged foods. ? Food that is high in trans fat, such as fried foods. ? Food that is high in saturated fat, such as fatty meat. ? Sweets, desserts, sugary drinks, and other foods with added sugar. ? Full-fat dairy products.  Do not salt foods before eating.  Try to eat at least 2 vegetarian meals each week.  Eat more home-cooked food and less restaurant, buffet, and fast food.  When eating at a restaurant, ask that your food be prepared with less salt or no salt, if possible. What foods are recommended? The items listed may not be a complete list. Talk with your dietitian about what   dietary choices are best for you. Grains Whole-grain or whole-wheat bread. Whole-grain or whole-wheat pasta. Brown rice. Oatmeal. Quinoa. Bulgur. Whole-grain and low-sodium cereals. Pita bread. Low-fat, low-sodium crackers. Whole-wheat flour tortillas. Vegetables Fresh or frozen vegetables (raw, steamed, roasted, or grilled). Low-sodium or reduced-sodium tomato and vegetable juice. Low-sodium or reduced-sodium tomato sauce and tomato paste. Low-sodium or reduced-sodium canned vegetables. Fruits All fresh, dried, or frozen fruit. Canned fruit in natural juice (without  added sugar). Meat and other protein foods Skinless chicken or turkey. Ground chicken or turkey. Pork with fat trimmed off. Fish and seafood. Egg whites. Dried beans, peas, or lentils. Unsalted nuts, nut butters, and seeds. Unsalted canned beans. Lean cuts of beef with fat trimmed off. Low-sodium, lean deli meat. Dairy Low-fat (1%) or fat-free (skim) milk. Fat-free, low-fat, or reduced-fat cheeses. Nonfat, low-sodium ricotta or cottage cheese. Low-fat or nonfat yogurt. Low-fat, low-sodium cheese. Fats and oils Soft margarine without trans fats. Vegetable oil. Low-fat, reduced-fat, or light mayonnaise and salad dressings (reduced-sodium). Canola, safflower, olive, soybean, and sunflower oils. Avocado. Seasoning and other foods Herbs. Spices. Seasoning mixes without salt. Unsalted popcorn and pretzels. Fat-free sweets. What foods are not recommended? The items listed may not be a complete list. Talk with your dietitian about what dietary choices are best for you. Grains Baked goods made with fat, such as croissants, muffins, or some breads. Dry pasta or rice meal packs. Vegetables Creamed or fried vegetables. Vegetables in a cheese sauce. Regular canned vegetables (not low-sodium or reduced-sodium). Regular canned tomato sauce and paste (not low-sodium or reduced-sodium). Regular tomato and vegetable juice (not low-sodium or reduced-sodium). Pickles. Olives. Fruits Canned fruit in a light or heavy syrup. Fried fruit. Fruit in cream or butter sauce. Meat and other protein foods Fatty cuts of meat. Ribs. Fried meat. Bacon. Sausage. Bologna and other processed lunch meats. Salami. Fatback. Hotdogs. Bratwurst. Salted nuts and seeds. Canned beans with added salt. Canned or smoked fish. Whole eggs or egg yolks. Chicken or turkey with skin. Dairy Whole or 2% milk, cream, and half-and-half. Whole or full-fat cream cheese. Whole-fat or sweetened yogurt. Full-fat cheese. Nondairy creamers. Whipped toppings.  Processed cheese and cheese spreads. Fats and oils Butter. Stick margarine. Lard. Shortening. Ghee. Bacon fat. Tropical oils, such as coconut, palm kernel, or palm oil. Seasoning and other foods Salted popcorn and pretzels. Onion salt, garlic salt, seasoned salt, table salt, and sea salt. Worcestershire sauce. Tartar sauce. Barbecue sauce. Teriyaki sauce. Soy sauce, including reduced-sodium. Steak sauce. Canned and packaged gravies. Fish sauce. Oyster sauce. Cocktail sauce. Horseradish that you find on the shelf. Ketchup. Mustard. Meat flavorings and tenderizers. Bouillon cubes. Hot sauce and Tabasco sauce. Premade or packaged marinades. Premade or packaged taco seasonings. Relishes. Regular salad dressings. Where to find more information:  National Heart, Lung, and Blood Institute: www.nhlbi.nih.gov  American Heart Association: www.heart.org Summary  The DASH eating plan is a healthy eating plan that has been shown to reduce high blood pressure (hypertension). It may also reduce your risk for type 2 diabetes, heart disease, and stroke.  With the DASH eating plan, you should limit salt (sodium) intake to 2,300 mg a day. If you have hypertension, you may need to reduce your sodium intake to 1,500 mg a day.  When on the DASH eating plan, aim to eat more fresh fruits and vegetables, whole grains, lean proteins, low-fat dairy, and heart-healthy fats.  Work with your health care provider or diet and nutrition specialist (dietitian) to adjust your eating plan to your individual   calorie needs. This information is not intended to replace advice given to you by your health care provider. Make sure you discuss any questions you have with your health care provider. Document Released: 11/18/2011 Document Revised: 11/22/2016 Document Reviewed: 11/22/2016 Elsevier Interactive Patient Education  2018 Elsevier Inc.  

## 2018-09-01 NOTE — Assessment & Plan Note (Signed)
Chronic, fair control Continue OTC antihistamine and Flonase

## 2018-09-01 NOTE — Assessment & Plan Note (Signed)
Reportedly well controlled with good compliance with CPAP machine We will complete paperwork for medical necessity of new hose for her CPAP when this is available

## 2018-09-01 NOTE — Assessment & Plan Note (Signed)
Chronic and stable Continue PPI and H2 blocker Followed by GI We will request the records to evaluate last EGD and colonoscopy

## 2018-09-01 NOTE — Assessment & Plan Note (Signed)
Discussed importance of healthy weight management Discussed diet and exercise Her morbid obesity is complicated by GERD, hypertension, and prediabetes

## 2018-09-01 NOTE — Assessment & Plan Note (Signed)
No formal diagnosis Patient has anxious mood/affect, but is unclear if this is affecting her lifestyle She is not currently on any medications She is seeking out a therapist and a psychiatrist GAD-7 screening at next visit

## 2018-09-01 NOTE — Assessment & Plan Note (Signed)
Continue current supplement Recheck vitamin D level

## 2018-09-01 NOTE — Assessment & Plan Note (Signed)
Chronic and stable Continue Cartia XT 120 mg daily Followed by cardiology

## 2018-09-01 NOTE — Assessment & Plan Note (Signed)
Not currently on any medications Discussed low-carb diet and exercise Recheck A1c

## 2018-09-01 NOTE — Assessment & Plan Note (Signed)
Well-controlled today Continue losartan 100 mg daily Check CMP

## 2018-09-01 NOTE — Assessment & Plan Note (Signed)
Unknown what last hemoglobin level was Her chronic blood loss has improved since having IUD placed Recheck iron panel and CBC today

## 2018-09-05 LAB — UPPER GI ENDOSCOPY

## 2018-09-08 DIAGNOSIS — R7303 Prediabetes: Secondary | ICD-10-CM | POA: Diagnosis not present

## 2018-09-08 DIAGNOSIS — D500 Iron deficiency anemia secondary to blood loss (chronic): Secondary | ICD-10-CM | POA: Diagnosis not present

## 2018-09-08 DIAGNOSIS — G4733 Obstructive sleep apnea (adult) (pediatric): Secondary | ICD-10-CM | POA: Diagnosis not present

## 2018-09-08 DIAGNOSIS — E559 Vitamin D deficiency, unspecified: Secondary | ICD-10-CM | POA: Diagnosis not present

## 2018-09-08 DIAGNOSIS — I10 Essential (primary) hypertension: Secondary | ICD-10-CM | POA: Diagnosis not present

## 2018-09-08 DIAGNOSIS — R002 Palpitations: Secondary | ICD-10-CM | POA: Diagnosis not present

## 2018-09-09 LAB — CBC WITH DIFFERENTIAL/PLATELET
Basophils Absolute: 0 10*3/uL (ref 0.0–0.2)
Basos: 0 %
EOS (ABSOLUTE): 0.3 10*3/uL (ref 0.0–0.4)
Eos: 3 %
Hematocrit: 35.2 % (ref 34.0–46.6)
Hemoglobin: 10.6 g/dL — ABNORMAL LOW (ref 11.1–15.9)
IMMATURE GRANULOCYTES: 0 %
Immature Grans (Abs): 0 10*3/uL (ref 0.0–0.1)
Lymphocytes Absolute: 2.4 10*3/uL (ref 0.7–3.1)
Lymphs: 28 %
MCH: 23.8 pg — ABNORMAL LOW (ref 26.6–33.0)
MCHC: 30.1 g/dL — ABNORMAL LOW (ref 31.5–35.7)
MCV: 79 fL (ref 79–97)
MONOS ABS: 0.5 10*3/uL (ref 0.1–0.9)
Monocytes: 5 %
NEUTROS PCT: 64 %
Neutrophils Absolute: 5.5 10*3/uL (ref 1.4–7.0)
PLATELETS: 310 10*3/uL (ref 150–450)
RBC: 4.46 x10E6/uL (ref 3.77–5.28)
RDW: 14.8 % (ref 12.3–15.4)
WBC: 8.7 10*3/uL (ref 3.4–10.8)

## 2018-09-09 LAB — LIPID PANEL
CHOL/HDL RATIO: 2.8 ratio (ref 0.0–4.4)
Cholesterol, Total: 138 mg/dL (ref 100–199)
HDL: 49 mg/dL (ref >39)
LDL CALC: 73 mg/dL (ref 0–99)
Triglycerides: 81 mg/dL (ref 0–149)
VLDL Cholesterol Cal: 16 mg/dL (ref 5–40)

## 2018-09-09 LAB — COMPREHENSIVE METABOLIC PANEL
A/G RATIO: 1.5 (ref 1.2–2.2)
ALBUMIN: 4.1 g/dL (ref 3.5–5.5)
ALK PHOS: 78 IU/L (ref 39–117)
ALT: 21 IU/L (ref 0–32)
AST: 16 IU/L (ref 0–40)
BILIRUBIN TOTAL: 0.3 mg/dL (ref 0.0–1.2)
BUN / CREAT RATIO: 22 (ref 9–23)
BUN: 17 mg/dL (ref 6–24)
CHLORIDE: 102 mmol/L (ref 96–106)
CO2: 22 mmol/L (ref 20–29)
CREATININE: 0.79 mg/dL (ref 0.57–1.00)
Calcium: 9.2 mg/dL (ref 8.7–10.2)
GFR calc Af Amer: 106 mL/min/{1.73_m2} (ref >59)
GFR calc non Af Amer: 92 mL/min/{1.73_m2} (ref >59)
GLOBULIN, TOTAL: 2.8 g/dL (ref 1.5–4.5)
Glucose: 98 mg/dL (ref 65–99)
POTASSIUM: 4.6 mmol/L (ref 3.5–5.2)
SODIUM: 138 mmol/L (ref 134–144)
Total Protein: 6.9 g/dL (ref 6.0–8.5)

## 2018-09-09 LAB — IRON,TIBC AND FERRITIN PANEL
Ferritin: 14 ng/mL — ABNORMAL LOW (ref 15–150)
IRON: 22 ug/dL — AB (ref 27–159)
Iron Saturation: 6 % — CL (ref 15–55)
Total Iron Binding Capacity: 354 ug/dL (ref 250–450)
UIBC: 332 ug/dL (ref 131–425)

## 2018-09-09 LAB — HEMOGLOBIN A1C
Est. average glucose Bld gHb Est-mCnc: 117 mg/dL
Hgb A1c MFr Bld: 5.7 % — ABNORMAL HIGH (ref 4.8–5.6)

## 2018-09-09 LAB — TSH: TSH: 2.93 u[IU]/mL (ref 0.450–4.500)

## 2018-09-09 LAB — VITAMIN D 25 HYDROXY (VIT D DEFICIENCY, FRACTURES): Vit D, 25-Hydroxy: 32.9 ng/mL (ref 30.0–100.0)

## 2018-09-12 ENCOUNTER — Telehealth: Payer: Self-pay

## 2018-09-12 DIAGNOSIS — G4733 Obstructive sleep apnea (adult) (pediatric): Secondary | ICD-10-CM | POA: Diagnosis not present

## 2018-09-12 NOTE — Telephone Encounter (Signed)
Patient was advised. KW

## 2018-09-12 NOTE — Telephone Encounter (Signed)
Error

## 2018-09-12 NOTE — Telephone Encounter (Signed)
LMTCB

## 2018-09-12 NOTE — Telephone Encounter (Signed)
-----   Message from Birdie Sons, MD sent at 09/12/2018  8:07 AM EDT ----- Slightly anemic, low iron levels, she needs to take a daily iron supplement, if already taking one then need to double it. Rest of labs are normal. Very good cholesterol.

## 2018-09-25 ENCOUNTER — Encounter: Payer: Self-pay | Admitting: Family Medicine

## 2018-09-25 NOTE — Progress Notes (Signed)
Medical records are received from [facility].  Vaccinations, recent labs, and health maintenance are abstracted and updated in the chart.  Other important findings are as below:  - IUD was placed in 2017.  Ultrasound in 1/18 showed it was in place.  She was having breakthrough bleeding and was given 2 months of low-dose OCPs along with the IUD.   - She has known uterine leiomyomas with 2 fibroids up to 3 cm in size -Last ultrasound from 10/2017 scanned into the chart -Last Pap smear 07/2017 showed negative for intraepithelial lesions or malignancy.  Satisfactory for evaluation with endocervical component present.  Negative HPV  Bacigalupo, Dionne Bucy, MD, MPH [facility] 09/25/2018 3:30 PM

## 2018-10-11 ENCOUNTER — Encounter: Payer: Self-pay | Admitting: Family Medicine

## 2018-10-11 ENCOUNTER — Ambulatory Visit: Payer: Federal, State, Local not specified - PPO | Admitting: Family Medicine

## 2018-10-11 VITALS — BP 130/80 | HR 63 | Temp 98.9°F | Wt 287.6 lb

## 2018-10-11 DIAGNOSIS — L738 Other specified follicular disorders: Secondary | ICD-10-CM | POA: Diagnosis not present

## 2018-10-11 DIAGNOSIS — R21 Rash and other nonspecific skin eruption: Secondary | ICD-10-CM | POA: Diagnosis not present

## 2018-10-11 MED ORDER — CEPHALEXIN 500 MG PO CAPS: 500.0000 mg | ORAL_CAPSULE | Freq: Two times a day (BID) | ORAL | 0 refills | Status: AC

## 2018-10-11 NOTE — Progress Notes (Signed)
Patient: Brenda Ross Female    DOB: [birth year]   42 y.o.   MRN: [id number] Visit Date: 10/11/2018  Today's Provider: Lavon Paganini, MD   Chief Complaint  Patient presents with  . Rash   Subjective:    I, Hurman Horn, CMA, am acting as a scribe for Lavon Paganini, MD.   Rash   Patient presents today for rash in the pubic area. Patient states she had shaved her pubic hair and nicked her skin on 10/05/2018 and then 2-3 days afterwards she noticed the rash. Patient stays she has been putting an antiseptic cream on the area.   It is red and tender, but not terribly painful.  No drainage, itch, or fever      Allergies  Allergen Reactions  . Eggs Or Egg-Derived Products Diarrhea  . Gluten Meal Diarrhea    Celiac's disease Celiac's disease Celiac's disease Celiac's disease Celiac's disease Celiac's disease Celiac's disease   . Pea Extract Diarrhea     Current Outpatient Medications:  .  CARTIA XT 120 MG 24 hr capsule, Take 1 capsule by mouth daily., Disp: , Rfl:  .  Cholecalciferol (VITAMIN D PO), Take by mouth., Disp: , Rfl:  .  losartan (COZAAR) 100 MG tablet, Take 1 tablet by mouth daily., Disp: , Rfl:  .  pantoprazole (PROTONIX) 40 MG tablet, Take 1 tablet by mouth 2 (two) times daily., Disp: , Rfl:  .  ranitidine (ZANTAC) 150 MG tablet, Take 1 tablet by mouth 2 (two) times daily., Disp: , Rfl:   Review of Systems  Constitutional: Negative.   Respiratory: Negative.   Cardiovascular: Negative.   Musculoskeletal: Negative.   Skin: Positive for rash.    Social History   Tobacco Use  . Smoking status: Former Smoker    Packs/day: 1.00    Years: 3.00    Pack years: 3.00    Types: Cigarettes    Last attempt to quit: 01/14/1996    Years since quitting: 22.7  . Smokeless tobacco: Never Used  Substance Use Topics  . Alcohol use: Yes    Alcohol/week: 7.0 - 14.0 standard drinks    Types: 7 - 14 Glasses of wine per week    Frequency: Never    Objective:   BP 130/80 (BP Location: Right Wrist, Patient Position: Sitting, Cuff Size: Normal)   Pulse 63   Temp 98.9 F (37.2 C) (Oral)   Wt 287 lb 9.6 oz (130.5 kg)   SpO2 98%   BMI 50.15 kg/m  Vitals:   10/11/18 1345  BP: 130/80  Pulse: 63  Temp: 98.9 F (37.2 C)  TempSrc: Oral  SpO2: 98%  Weight: 287 lb 9.6 oz (130.5 kg)     Physical Exam  Constitutional: She is oriented to person, place, and time. She appears well-developed and well-nourished. No distress.  HENT:  Head: Normocephalic and atraumatic.  Eyes: Conjunctivae are normal. No scleral icterus.  Neck: Neck supple. No thyromegaly present.  Cardiovascular: Normal rate, regular rhythm, normal heart sounds and intact distal pulses.  No murmur heard. Pulmonary/Chest: Effort normal and breath sounds normal. No respiratory distress. She has no wheezes. She has no rales.  Abdominal: Soft. She exhibits no distension. There is no tenderness.  Genitourinary: There is rash (small pustules, erythematous area with small cut, mildly TTP) on the right labia.  Musculoskeletal: She exhibits no edema.  Lymphadenopathy:    She has no cervical adenopathy.  Neurological: She is alert and oriented to person, place, and  time.  Skin: Skin is warm and dry. Capillary refill takes less than 2 seconds.  Psychiatric: She has a normal mood and affect. Her behavior is normal.  Vitals reviewed.       Assessment & Plan:   1. Folliculitis barbae 2. Rash of genitalia - Rash appears to be folliculitis - seems to be tender related to cut  - no vesicles, more pustules - seems most likely to be folliculitis, not HSV, but did swab a lesion - will treat with Keflex x7d - return precautions discussed - Herpes simplex virus culture    Meds ordered this encounter  Medications  . cephALEXin (KEFLEX) 500 MG capsule    Sig: Take 1 capsule (500 mg total) by mouth 2 (two) times daily for 7 days.    Dispense:  14 capsule    Refill:  0      Return if symptoms worsen or fail to improve.   The entirety of the information documented in the History of Present Illness, Review of Systems and Physical Exam were personally obtained by me. Portions of this information were initially documented by Hurman Horn, CMA and reviewed by me for thoroughness and accuracy.    Virginia Crews, MD, MPH [facility] 10/11/2018 3:59 PM

## 2018-10-11 NOTE — Patient Instructions (Signed)

## 2018-10-12 ENCOUNTER — Telehealth: Payer: Self-pay | Admitting: Family Medicine

## 2018-10-12 NOTE — Telephone Encounter (Signed)
Pt wanting a call back from Dr. B personally.  She wants to discuss something from yesterday's visit only with Dr. Newell Coral, [facility]

## 2018-10-13 ENCOUNTER — Encounter: Payer: Self-pay | Admitting: Family Medicine

## 2018-10-13 ENCOUNTER — Ambulatory Visit: Payer: Federal, State, Local not specified - PPO | Admitting: Family Medicine

## 2018-10-13 VITALS — BP 115/72 | HR 60 | Temp 98.4°F | Wt 289.4 lb

## 2018-10-13 DIAGNOSIS — N764 Abscess of vulva: Secondary | ICD-10-CM | POA: Diagnosis not present

## 2018-10-13 DIAGNOSIS — Z113 Encounter for screening for infections with a predominantly sexual mode of transmission: Secondary | ICD-10-CM | POA: Diagnosis not present

## 2018-10-13 DIAGNOSIS — G4733 Obstructive sleep apnea (adult) (pediatric): Secondary | ICD-10-CM | POA: Diagnosis not present

## 2018-10-13 DIAGNOSIS — N750 Cyst of Bartholin's gland: Secondary | ICD-10-CM | POA: Diagnosis not present

## 2018-10-13 DIAGNOSIS — L739 Follicular disorder, unspecified: Secondary | ICD-10-CM | POA: Diagnosis not present

## 2018-10-13 NOTE — Progress Notes (Signed)
Patient: Brenda Ross Female    DOB: 11-24-1976   42 y.o.   MRN: [id number] Visit Date: 10/13/2018  Today's Provider: Lavon Paganini, MD   Chief Complaint  Patient presents with  . Rash   Subjective:    I, Tiburcio Pea, CMA, am acting as a scribe for Lavon Paganini, MD.   HPI Folliculitis:  Patient presents for a follow up. Last OV was on 10/11/2018. Patient started Cephalexin 384 mg BID for folliculitis. She reports good compliance with treatment plan. Patient states the rash was gradually worsening yesterday, and today the pain is not quite as bad. She reports the area is swelling also.  No drainage or fever     Allergies  Allergen Reactions  . Eggs Or Egg-Derived Products Diarrhea  . Gluten Meal Diarrhea    Celiac's disease Celiac's disease Celiac's disease Celiac's disease Celiac's disease Celiac's disease Celiac's disease   . Pea Extract Diarrhea     Current Outpatient Medications:  .  CARTIA XT 120 MG 24 hr capsule, Take 1 capsule by mouth daily., Disp: , Rfl:  .  cephALEXin (KEFLEX) 500 MG capsule, Take 1 capsule (500 mg total) by mouth 2 (two) times daily for 7 days., Disp: 14 capsule, Rfl: 0 .  Cholecalciferol (VITAMIN D PO), Take by mouth., Disp: , Rfl:  .  losartan (COZAAR) 100 MG tablet, Take 1 tablet by mouth daily., Disp: , Rfl:  .  pantoprazole (PROTONIX) 40 MG tablet, Take 1 tablet by mouth 2 (two) times daily., Disp: , Rfl:  .  ranitidine (ZANTAC) 150 MG tablet, Take 1 tablet by mouth 2 (two) times daily., Disp: , Rfl:   Review of Systems  Constitutional: Negative.   Respiratory: Negative.   Cardiovascular: Negative.   Gastrointestinal: Negative.   Genitourinary: Negative.   Skin: Positive for rash.  Neurological: Negative.     Social History   Tobacco Use  . Smoking status: Former Smoker    Packs/day: 1.00    Years: 3.00    Pack years: 3.00    Types: Cigarettes    Last attempt to quit: 01/14/1996    Years since  quitting: 22.7  . Smokeless tobacco: Never Used  Substance Use Topics  . Alcohol use: Yes    Alcohol/week: 7.0 - 14.0 standard drinks    Types: 7 - 14 Glasses of wine per week    Frequency: Never   Objective:   BP 115/72 (BP Location: Right Arm, Patient Position: Sitting, Cuff Size: Large)   Pulse 60   Temp 98.4 F (36.9 C) (Oral)   Wt 289 lb 6.4 oz (131.3 kg)   SpO2 98%   BMI 50.46 kg/m  Vitals:   10/13/18 0944  BP: 115/72  Pulse: 60  Temp: 98.4 F (36.9 C)  TempSrc: Oral  SpO2: 98%  Weight: 289 lb 6.4 oz (131.3 kg)     Physical Exam  Constitutional: She is oriented to person, place, and time. She appears well-developed and well-nourished. No distress.  HENT:  Head: Normocephalic and atraumatic.  Mouth/Throat: Oropharynx is clear and moist.  Eyes: Conjunctivae are normal. No scleral icterus.  Neck: Neck supple. No thyromegaly present.  Cardiovascular: Normal rate, regular rhythm and normal heart sounds.  No murmur heard. Pulmonary/Chest: Effort normal and breath sounds normal. No respiratory distress. She has no wheezes. She has no rales.  Abdominal: Soft. She exhibits no distension. There is no tenderness.  Genitourinary: There is no rash on the right labia.  Genitourinary  Comments: Folliculitis has resolved.  No vesicular or pustular rash present.  R labial swelling and TTP  Musculoskeletal: She exhibits no edema.  Lymphadenopathy:    She has no cervical adenopathy.  Neurological: She is alert and oriented to person, place, and time.  Skin: Skin is warm and dry. Capillary refill takes less than 2 seconds.  Psychiatric: She has a normal mood and affect. Her behavior is normal.  Vitals reviewed.      Assessment & Plan:   1. Bartholin gland cyst - new problem - discussed that ObGyn may be able to drain and place Word catheter - she will call her Gyn - if worsens and seems to be infected, could consider MAU at [facility] hospital if unable to see her Gyn -  return precautions discussed  2. Folliculitis - seems to be improving significantly - continue Keflex to finish 7 day course   Return if symptoms worsen or fail to improve.   The entirety of the information documented in the History of Present Illness, Review of Systems and Physical Exam were personally obtained by me. Portions of this information were initially documented by Tiburcio Pea and Ashley Royalty, CMA and reviewed by me for thoroughness and accuracy.    Virginia Crews, MD, MPH [facility] 10/13/2018 1:30 PM

## 2018-10-13 NOTE — Patient Instructions (Addendum)
[facility] in [facility] Admissions Unit - Emergency Room [address], [place], [place] [id number]   Bartholin Cyst or Abscess A Bartholin cyst is a fluid-filled sac that forms on a Bartholin gland. Bartholin glands are small glands that are found in the folds of skin (labia) on the sides of the lower opening of the vagina. This type of cyst causes a bulge on the side of the vagina. A cyst that is not large or infected may not cause problems. However, if the fluid in the cyst becomes infected, the cyst can turn into an abscess. An abscess may cause discomfort or pain. Follow these instructions at home:  Take medicines only as told by your doctor.  If you were prescribed an antibiotic medicine, finish all of it even if you start to feel better.  Apply warm, wet compresses to the area or take warm, shallow baths that cover your pelvic area (sitz baths). Do this many times each day or as told by your doctor.  Do not squeeze the cyst. Do not apply heavy pressure to it.  Do not have sex until the cyst has gone away.  If your cyst or abscess was opened by your doctor, a small piece of gauze or a drain may have been placed in the area. That lets the cyst drain. Do not remove the gauze or the drain until your doctor tells you it is okay to do that.  Do not wear tampons. Wear feminine pads as needed for any fluid or blood.  Keep all follow-up visits as told by your doctor. This is important. Contact a doctor if:  Your pain, puffiness (swelling), or redness in the area of the cyst gets worse.  You have fluid or pus pus coming from the cyst.  You have a fever. This information is not intended to replace advice given to you by your health care provider. Make sure you discuss any questions you have with your health care provider. Document Released: 02/25/2009 Document Revised: 05/06/2016 Document Reviewed: 07/15/2014 Elsevier Interactive Patient Education  2018 [employer].

## 2018-10-13 NOTE — Telephone Encounter (Signed)
Spoke to patient.  She will be in for OV to reassess later today  Brita Romp, Dionne Bucy, MD, MPH [facility] 10/13/2018 8:47 AM

## 2018-10-14 LAB — HERPES SIMPLEX VIRUS CULTURE

## 2018-10-16 ENCOUNTER — Telehealth: Payer: Self-pay

## 2018-10-16 NOTE — Telephone Encounter (Signed)
Patient advised. Patient states she seen her provider at [place] on Friday and she told patient the rash looked like HSV and prescribed Valtrex. Patient states she has a follow up with OB GYN tomorrow.

## 2018-10-16 NOTE — Telephone Encounter (Signed)
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  If still having lesions, can start Valtrex.  Virginia Crews, MD, MPH  [facility] 10/16/2018 11:07 AM

## 2018-10-17 DIAGNOSIS — A609 Anogenital herpesviral infection, unspecified: Secondary | ICD-10-CM | POA: Diagnosis not present

## 2018-10-17 DIAGNOSIS — A6000 Herpesviral infection of urogenital system, unspecified: Secondary | ICD-10-CM | POA: Insufficient documentation

## 2018-10-17 DIAGNOSIS — Z01419 Encounter for gynecological examination (general) (routine) without abnormal findings: Secondary | ICD-10-CM | POA: Diagnosis not present

## 2018-10-17 DIAGNOSIS — T8389XA Other specified complication of genitourinary prosthetic devices, implants and grafts, initial encounter: Secondary | ICD-10-CM | POA: Diagnosis not present

## 2018-10-18 ENCOUNTER — Other Ambulatory Visit: Payer: Self-pay

## 2018-10-18 ENCOUNTER — Emergency Department
Admission: EM | Admit: 2018-10-18 | Discharge: 2018-10-19 | Disposition: A | Discharge status: Home / Self Care | Payer: Federal, State, Local not specified - PPO | Attending: Emergency Medicine | Admitting: Emergency Medicine

## 2018-10-18 ENCOUNTER — Telehealth: Payer: Self-pay

## 2018-10-18 ENCOUNTER — Encounter: Payer: Self-pay | Admitting: Emergency Medicine

## 2018-10-18 ENCOUNTER — Ambulatory Visit (INDEPENDENT_AMBULATORY_CARE_PROVIDER_SITE_OTHER)
Admission: EM | Admit: 2018-10-18 | Discharge: 2018-10-18 | Disposition: A | Discharge status: Home / Self Care | Payer: Federal, State, Local not specified - PPO | Source: Home / Self Care | Attending: Family Medicine | Admitting: Family Medicine

## 2018-10-18 ENCOUNTER — Emergency Department: Payer: Federal, State, Local not specified - PPO

## 2018-10-18 DIAGNOSIS — L739 Follicular disorder, unspecified: Secondary | ICD-10-CM | POA: Diagnosis not present

## 2018-10-18 DIAGNOSIS — B349 Viral infection, unspecified: Secondary | ICD-10-CM | POA: Diagnosis not present

## 2018-10-18 DIAGNOSIS — I10 Essential (primary) hypertension: Secondary | ICD-10-CM | POA: Insufficient documentation

## 2018-10-18 DIAGNOSIS — Z87891 Personal history of nicotine dependence: Secondary | ICD-10-CM | POA: Diagnosis not present

## 2018-10-18 DIAGNOSIS — Z79899 Other long term (current) drug therapy: Secondary | ICD-10-CM | POA: Insufficient documentation

## 2018-10-18 DIAGNOSIS — B009 Herpesviral infection, unspecified: Secondary | ICD-10-CM | POA: Diagnosis not present

## 2018-10-18 DIAGNOSIS — R1084 Generalized abdominal pain: Secondary | ICD-10-CM | POA: Insufficient documentation

## 2018-10-18 DIAGNOSIS — A6004 Herpesviral vulvovaginitis: Secondary | ICD-10-CM | POA: Diagnosis not present

## 2018-10-18 DIAGNOSIS — L02215 Cutaneous abscess of perineum: Secondary | ICD-10-CM | POA: Diagnosis not present

## 2018-10-18 DIAGNOSIS — B002 Herpesviral gingivostomatitis and pharyngotonsillitis: Secondary | ICD-10-CM | POA: Diagnosis not present

## 2018-10-18 HISTORY — DX: Herpesviral infection of urogenital system, unspecified: A60.00

## 2018-10-18 LAB — POCT PREGNANCY, URINE: Preg Test, Ur: NEGATIVE

## 2018-10-18 LAB — CBC
HCT: 34.4 % — ABNORMAL LOW (ref 36.0–46.0)
Hemoglobin: 10.7 g/dL — ABNORMAL LOW (ref 12.0–15.0)
MCH: 24.9 pg — AB (ref 26.0–34.0)
MCHC: 31.1 g/dL (ref 30.0–36.0)
MCV: 80 fL (ref 80.0–100.0)
PLATELETS: 213 10*3/uL (ref 150–400)
RBC: 4.3 MIL/uL (ref 3.87–5.11)
RDW: 15.2 % (ref 11.5–15.5)
WBC: 3.9 10*3/uL — AB (ref 4.0–10.5)
nRBC: 0 % (ref 0.0–0.2)

## 2018-10-18 LAB — COMPREHENSIVE METABOLIC PANEL
ALBUMIN: 3.5 g/dL (ref 3.5–5.0)
ALK PHOS: 95 U/L (ref 38–126)
ALT: 42 U/L (ref 0–44)
AST: 43 U/L — ABNORMAL HIGH (ref 15–41)
Anion gap: 8 (ref 5–15)
BUN: 12 mg/dL (ref 6–20)
CALCIUM: 8.6 mg/dL — AB (ref 8.9–10.3)
CHLORIDE: 102 mmol/L (ref 98–111)
CO2: 24 mmol/L (ref 22–32)
Creatinine, Ser: 0.94 mg/dL (ref 0.44–1.00)
GFR calc non Af Amer: >60 mL/min (ref >60)
Glucose, Bld: 143 mg/dL — ABNORMAL HIGH (ref 70–99)
Potassium: 3.3 mmol/L — ABNORMAL LOW (ref 3.5–5.1)
SODIUM: 134 mmol/L — AB (ref 135–145)
Total Bilirubin: 0.5 mg/dL (ref 0.3–1.2)
Total Protein: 7.2 g/dL (ref 6.5–8.1)

## 2018-10-18 LAB — URINALYSIS, COMPLETE (UACMP) WITH MICROSCOPIC
BACTERIA UA: NONE SEEN
Bilirubin Urine: NEGATIVE
Glucose, UA: NEGATIVE mg/dL
KETONES UR: NEGATIVE mg/dL
Leukocytes, UA: NEGATIVE
NITRITE: NEGATIVE
PH: 6 (ref 5.0–8.0)
PROTEIN: NEGATIVE mg/dL
Specific Gravity, Urine: 1.002 — ABNORMAL LOW (ref 1.005–1.030)

## 2018-10-18 LAB — RAPID INFLUENZA A&B ANTIGENS (ARMC ONLY): INFLUENZA A (ARMC): NEGATIVE

## 2018-10-18 LAB — RAPID INFLUENZA A&B ANTIGENS: Influenza B (ARMC): NEGATIVE

## 2018-10-18 MED ORDER — DIPHENHYDRAMINE HCL 50 MG/ML IJ SOLN
INTRAMUSCULAR | Status: AC
  Administered 2018-10-19: 50 mg via INTRAVENOUS
  Filled 2018-10-18: qty 1

## 2018-10-18 MED ORDER — METHYLPREDNISOLONE SODIUM SUCC 125 MG IJ SOLR
INTRAMUSCULAR | Status: AC
  Administered 2018-10-19: 125 mg via INTRAVENOUS
  Filled 2018-10-18: qty 2

## 2018-10-18 MED ORDER — SULFAMETHOXAZOLE-TRIMETHOPRIM 800-160 MG PO TABS: 1.0000 | ORAL_TABLET | Freq: Two times a day (BID) | ORAL | 0 refills | Status: DC

## 2018-10-18 MED ORDER — FAMOTIDINE IN NACL 20-0.9 MG/50ML-% IV SOLN
INTRAVENOUS | Status: AC
  Administered 2018-10-19: 20 mg via INTRAVENOUS
  Filled 2018-10-18: qty 50

## 2018-10-18 MED ORDER — IOPAMIDOL (ISOVUE-300) INJECTION 61%
100.0000 mL | Freq: Once | INTRAVENOUS | Status: AC | PRN
  Administered 2018-10-18: 100 mL via INTRAVENOUS

## 2018-10-18 NOTE — ED Triage Notes (Signed)
Pt c/o body aches, headaches, fatigue, chills, fever (101.4), dizziness, diarrhea and cramps. She recently went to the GYN and diagnosed with herpes and several abscess in the area that were MRSA. She is taking bactrim and valtrex now, She saw her GYN yesterday for follow up and the lesions and abscesses were better. She reports that her lesions feel more sore today than yesterday.

## 2018-10-18 NOTE — ED Provider Notes (Signed)
[facility]    CSN: [id number] Arrival date & time: 10/18/18  1704     History   Chief Complaint Chief Complaint  Patient presents with  . Rash  . Fever    HPI Brenda Ross is a 42 y.o. female.   The history is provided by the patient.  URI  Presenting symptoms: congestion, fatigue, fever, rhinorrhea and sore throat (mild)   Severity:  Moderate Onset quality:  Sudden Duration:  1 day Timing:  Constant Progression:  Unchanged Chronicity:  New Relieved by:  None tried Ineffective treatments:  None tried Associated symptoms: headaches and myalgias   Associated symptoms: no wheezing   Risk factors: recent illness (patient recently seen by PCP and GYN for vulvar lesions and diagnosed with herpes and secondary bacterial follicutlitis; given valtrex and bactrim for 7 days)   Risk factors: not elderly, no chronic cardiac disease, no chronic kidney disease, no chronic respiratory disease, no diabetes mellitus, no immunosuppression, no recent travel and no sick contacts     Past Medical History:  Diagnosis Date  . Allergic rhinitis   . Anemia   . Anxiety   . GERD (gastroesophageal reflux disease)   . Herpes genitalia   . Prediabetes   . Sleep apnea     Patient Active Problem List   Diagnosis Date Noted  . Essential hypertension 09/01/2018  . Avitaminosis D 09/01/2018  . Morbid obesity ([place]) 09/01/2018  . Palpitations 09/01/2018  . GERD (gastroesophageal reflux disease)   . Prediabetes   . Sleep apnea   . Anxiety   . Anemia   . Allergic rhinitis     Past Surgical History:  Procedure Laterality Date  . APPENDECTOMY      OB History   None      Home Medications    Prior to Admission medications   Medication Sig Start Date End Date Taking? Authorizing Provider  CARTIA XT 120 MG 24 hr capsule Take 1 capsule by mouth daily. 07/30/18  Yes [provider]  losartan (COZAAR) 100 MG tablet Take 1 tablet by mouth daily. 07/30/18  Yes  [provider]  pantoprazole (PROTONIX) 40 MG tablet Take 1 tablet by mouth 2 (two) times daily. 05/16/18  Yes [provider]  ranitidine (ZANTAC) 150 MG tablet Take 1 tablet by mouth 2 (two) times daily. 05/12/18  Yes [provider]  cephALEXin (KEFLEX) 500 MG capsule Take 1 capsule (500 mg total) by mouth 2 (two) times daily for 7 days. 10/11/18 10/18/18  Virginia Crews, MD  Cholecalciferol (VITAMIN D PO) Take by mouth.    [provider]  sulfamethoxazole-trimethoprim (BACTRIM DS,SEPTRA DS) 800-160 MG tablet Take 1 tablet by mouth 2 (two) times daily. 10/18/18   Norval Gable, MD    Family History Family History  Problem Relation Age of Onset  . Diabetes Mother   . Hypertension Mother   . Heart disease Mother   . Hypertension Father   . Heart disease Father   . Prostate cancer Father   . Diabetes Father   . Diabetes Brother   . ADD / ADHD Brother   . ADD / ADHD Brother   . Breast cancer Neg Hx   . Colon cancer Neg Hx   . Ovarian cancer Neg Hx     Social History Social History   Tobacco Use  . Smoking status: Former Smoker    Packs/day: 1.00    Years: 3.00    Pack years: 3.00    Types:  Cigarettes    Last attempt to quit: 01/14/1996    Years since quitting: 22.7  . Smokeless tobacco: Never Used  Substance Use Topics  . Alcohol use: Yes    Alcohol/week: 7.0 - 14.0 standard drinks    Types: 7 - 14 Glasses of wine per week    Frequency: Never  . Drug use: Never     Allergies   Eggs or egg-derived products; Gluten meal; and Pea   Review of Systems Review of Systems  Constitutional: Positive for fatigue and fever.  HENT: Positive for congestion, rhinorrhea and sore throat (mild).   Respiratory: Negative for wheezing.   Musculoskeletal: Positive for myalgias.  Neurological: Positive for headaches.     Physical Exam Triage Vital Signs ED Triage Vitals  Enc Vitals Group     BP 10/18/18 1726 126/65     Pulse Rate  10/18/18 1726 84     Resp 10/18/18 1726 17     Temp 10/18/18 1726 100.2 F (37.9 C)     Temp Source 10/18/18 1726 Oral     SpO2 10/18/18 1726 99 %     Weight 10/18/18 1724 284 lb (128.8 kg)     Height 10/18/18 1724 5' 4"  (1.626 m)     Head Circumference --      Peak Flow --      Pain Score 10/18/18 1722 8     Pain Loc --      Pain Edu? --      Excl. in [place]? --    No data found.  Updated Vital Signs BP 126/65 (BP Location: Right Arm)   Pulse 84   Temp 100.2 F (37.9 C) (Oral)   Resp 17   Ht 5' 4"  (1.626 m)   Wt 128.8 kg   SpO2 99%   BMI 48.75 kg/m   Visual Acuity Right Eye Distance:   Left Eye Distance:   Bilateral Distance:    Right Eye Near:   Left Eye Near:    Bilateral Near:     Physical Exam  Constitutional: She appears well-developed and well-nourished. No distress.  HENT:  Head: Normocephalic and atraumatic.  Right Ear: Tympanic membrane, external ear and ear canal normal.  Left Ear: Tympanic membrane, external ear and ear canal normal.  Nose: Rhinorrhea present. No mucosal edema, nose lacerations, sinus tenderness, nasal deformity, septal deviation or nasal septal hematoma. No epistaxis.  No foreign bodies. Right sinus exhibits no maxillary sinus tenderness and no frontal sinus tenderness. Left sinus exhibits no maxillary sinus tenderness and no frontal sinus tenderness.  Mouth/Throat: Uvula is midline and mucous membranes are normal. Posterior oropharyngeal erythema present. No oropharyngeal exudate, posterior oropharyngeal edema or tonsillar abscesses. No tonsillar exudate.  Eyes: Conjunctivae are normal. Right eye exhibits no discharge. Left eye exhibits no discharge. No scleral icterus.  Neck: Normal range of motion. Neck supple. No thyromegaly present.  Cardiovascular: Normal rate, regular rhythm and normal heart sounds.  Pulmonary/Chest: Effort normal and breath sounds normal. No respiratory distress. She has no wheezes. She has no rales.  Abdominal:  Soft. Bowel sounds are normal. She exhibits no distension.  Genitourinary: Pelvic exam was performed with patient supine. There is rash and lesion on the right labia. There is rash and lesion on the left labia.  Genitourinary Comments: Visual inspection exam; Heather, RN present as chaperone during exam  Lymphadenopathy:    She has no cervical adenopathy.  Skin: She is not diaphoretic.  Nursing note and vitals reviewed.  UC Treatments / Results  Labs (all labs ordered are listed, but only abnormal results are displayed) Labs Reviewed  RAPID INFLUENZA A&B ANTIGENS ([place])    EKG None  Radiology No results found.  Procedures Procedures (including critical care time)  Medications Ordered in UC Medications - No data to display  Initial Impression / Assessment and Plan / UC Course  I have reviewed the triage vital signs and the nursing notes.  Pertinent labs & imaging results that were available during my care of the patient were reviewed by me and considered in my medical decision making (see chart for details).      Final Clinical Impressions(s) / UC Diagnoses   Final diagnoses:  Viral illness  Folliculitis  HSV infection     Discharge Instructions     Continue current medication Rest, fluids, over the counter tylenol/ibuprofen    ED Prescriptions    Medication Sig Dispense Auth. Provider   sulfamethoxazole-trimethoprim (BACTRIM DS,SEPTRA DS) 800-160 MG tablet Take 1 tablet by mouth 2 (two) times daily. 6 tablet Norval Gable, MD      1.diagnoses reviewed with patient 2. rx as per orders above; reviewed possible side effects, interactions, risks and benefits; extended bactrim for 3 days more (to complete total of 10 days course) 3. Recommend supportive treatment as above 4. Follow-up prn if symptoms worsen or don't improve   Controlled Substance Prescriptions [place] Controlled Substance Registry consulted? Not Applicable   Norval Gable,  MD 10/18/18 [phone]

## 2018-10-18 NOTE — Discharge Instructions (Signed)
Continue current medication Rest, fluids, over the counter tylenol/ibuprofen

## 2018-10-18 NOTE — ED Provider Notes (Signed)
[facility] Emergency Department Provider Note    First MD Initiated Contact with Patient 10/18/18 2310     (approximate)  I have reviewed the triage vital signs and the nursing notes.   HISTORY  Chief Complaint Wound Infection    HPI Brenda Ross is a 42 y.o. female with below list of chronic medical conditions including including recently diagnosed genital herpes and vulvar folliculitis with concern for MRSA presents to the emergency department with generalized body aches fever since Saturday patient states that she is noticed increase in the number of boils in the affected area.  Patient denies any nausea or vomiting.  Patient denies any dysuria.  Patient states that she is currently taking Bactrim as prescribed.  She states temperature at home was 101.7 for which she took Advil with provement of headache body aches and fever.   Past Medical History:  Diagnosis Date  . Allergic rhinitis   . Anemia   . Anxiety   . GERD (gastroesophageal reflux disease)   . Herpes genitalia   . Prediabetes   . Sleep apnea     Patient Active Problem List   Diagnosis Date Noted  . Essential hypertension 09/01/2018  . Avitaminosis D 09/01/2018  . Morbid obesity ([place]) 09/01/2018  . Palpitations 09/01/2018  . GERD (gastroesophageal reflux disease)   . Prediabetes   . Sleep apnea   . Anxiety   . Anemia   . Allergic rhinitis     Past Surgical History:  Procedure Laterality Date  . APPENDECTOMY      Prior to Admission medications   Medication Sig Start Date End Date Taking? Authorizing Provider  CARTIA XT 120 MG 24 hr capsule Take 1 capsule by mouth daily. 07/30/18   [provider]  Cholecalciferol (VITAMIN D PO) Take by mouth.    [provider]  losartan (COZAAR) 100 MG tablet Take 1 tablet by mouth daily. 07/30/18   [provider]  pantoprazole (PROTONIX) 40 MG tablet Take 1 tablet by mouth 2 (two) times daily. 05/16/18    [provider]  ranitidine (ZANTAC) 150 MG tablet Take 1 tablet by mouth 2 (two) times daily. 05/12/18   [provider]  sulfamethoxazole-trimethoprim (BACTRIM DS,SEPTRA DS) 800-160 MG tablet Take 1 tablet by mouth 2 (two) times daily. 10/18/18   Norval Gable, MD    Allergies Iodinated diagnostic agents; Eggs or egg-derived products; Gluten meal; and Pea  Family History  Problem Relation Age of Onset  . Diabetes Mother   . Hypertension Mother   . Heart disease Mother   . Hypertension Father   . Heart disease Father   . Prostate cancer Father   . Diabetes Father   . Diabetes Brother   . ADD / ADHD Brother   . ADD / ADHD Brother   . Breast cancer Neg Hx   . Colon cancer Neg Hx   . Ovarian cancer Neg Hx     Social History Social History   Tobacco Use  . Smoking status: Former Smoker    Packs/day: 1.00    Years: 3.00    Pack years: 3.00    Types: Cigarettes    Last attempt to quit: 01/14/1996    Years since quitting: 22.7  . Smokeless tobacco: Never Used  Substance Use Topics  . Alcohol use: Yes    Alcohol/week: 7.0 - 14.0 standard drinks    Types: 7 - 14 Glasses of wine per week    Frequency: Never  .  Drug use: Never    Review of Systems Constitutional: No fever/chills Eyes: No visual changes. ENT: No sore throat. Cardiovascular: Denies chest pain. Respiratory: Denies shortness of breath. Gastrointestinal: No abdominal pain.  No nausea, no vomiting.  No diarrhea.  No constipation. Genitourinary: Negative for dysuria. Musculoskeletal: Negative for neck pain.  Negative for back pain. Integumentary: Negative for rash. Neurological: Negative for headaches, focal weakness or numbness.   ____________________________________________   PHYSICAL EXAM:  VITAL SIGNS: ED Triage Vitals  Enc Vitals Group     BP 10/18/18 2251 (!) 140/59     Pulse Rate 10/18/18 2251 71     Resp 10/18/18 2251 18     Temp 10/18/18 2251 98.1 F (36.7 C)     Temp  Source 10/18/18 2251 Oral     SpO2 10/18/18 2251 97 %     Weight 10/18/18 2252 128.8 kg (284 lb)     Height 10/18/18 2252 1.626 m (5' 4" )     Head Circumference --      Peak Flow --      Pain Score 10/18/18 2251 3     Pain Loc --      Pain Edu? --      Excl. in [place]? --     Constitutional: Alert and oriented. Well appearing and in no acute distress. Eyes: Conjunctivae are normal.  Head: Atraumatic. Mouth/Throat: Mucous membranes are moist.  Oropharynx non-erythematous. Neck: No stridor.  Cardiovascular: Normal rate, regular rhythm. Good peripheral circulation. Grossly normal heart sounds. Respiratory: Normal respiratory effort.  No retractions. Lungs CTAB. Gastrointestinal: Soft and nontender. No distention.  Genitourinary: Folliculitis noted on the mons pubis.  Shallow based ulcers noted on the labia minora.  Indurated area noted on the right labia majora flocculence. Musculoskeletal: No lower extremity tenderness nor edema. No gross deformities of extremities. Neurologic:  Normal speech and language. No gross focal neurologic deficits are appreciated.  Skin:  Skin is warm, dry and intact. No rash noted. Psychiatric: Mood and affect are normal. Speech and behavior are normal.  ____________________________________________   LABS (all labs ordered are listed, but only abnormal results are displayed)  Labs Reviewed  WET PREP, GENITAL - Abnormal; Notable for the following components:      Result Value   WBC, Wet Prep HPF POC FEW (*)    All other components within normal limits  CBC - Abnormal; Notable for the following components:   WBC 3.9 (*)    Hemoglobin 10.7 (*)    HCT 34.4 (*)    MCH 24.9 (*)    All other components within normal limits  COMPREHENSIVE METABOLIC PANEL - Abnormal; Notable for the following components:   Sodium 134 (*)    Potassium 3.3 (*)    Glucose, Bld 143 (*)    Calcium 8.6 (*)    AST 43 (*)    All other components within normal limits  URINALYSIS,  COMPLETE (UACMP) WITH MICROSCOPIC - Abnormal; Notable for the following components:   Color, Urine STRAW (*)    APPearance CLEAR (*)    Specific Gravity, Urine 1.002 (*)    Hgb urine dipstick MODERATE (*)    All other components within normal limits  CHLAMYDIA/NGC RT PCR ([facility] ONLY)  CULTURE, BLOOD (SINGLE)  HIV ANTIBODY (ROUTINE TESTING W REFLEX)  RPR  POCT PREGNANCY, URINE   ____________________________________________  RADIOLOGY I, [place] N BROWN, personally viewed and evaluated these images (plain radiographs) as part of my medical decision making, as well as reviewing the written  report by the radiologist.  ED MD interpretation: Nonspecific enlarged pelvic and groin lymph nodes on CT pelvis however no abscess noted.  Official radiology report(s): Ct Pelvis W Contrast  Result Date: 10/19/2018 CLINICAL DATA:  Body aches, headaches, fatigue, chills, fever, dizziness, diarrhea, cramps. Recent diagnosis of herpes and abscesses. EXAM: CT PELVIS WITH CONTRAST TECHNIQUE: Multidetector CT imaging of the pelvis was performed using the standard protocol following the bolus administration of intravenous contrast. CONTRAST:  185m ISOVUE-300 IOPAMIDOL (ISOVUE-300) INJECTION 61% COMPARISON:  None. FINDINGS: Urinary Tract:  No bladder wall thickening or filling defects. Bowel: Visualized small and large bowel are not abnormally distended. Scattered diverticula in the sigmoid colon. No evidence of diverticulitis. Vascular/Lymphatic: No aneurysm or significant vascular calcifications. Reproductive: Uterus and ovaries are not enlarged. An intrauterine device is present centrally in the uterus. Other: No free air or free fluid in the pelvis. Pelvic and groin lymph nodes are enlarged with right groin lymph nodes measuring up to 1.8 cm diameter. This appearance is nonspecific, but in the setting of infection, likely represents reactive lymphadenopathy. Follow-up as clinically indicated. No soft  tissue abscess or infiltration identified. Musculoskeletal: No suspicious bone lesions identified. IMPRESSION: Nonspecific enlarged pelvic and groin lymph nodes, likely reactive in the setting of an infectious process. No soft tissue abscess or infiltration identified. Electronically Signed   By: WLucienne CapersM.D.   On: 10/19/2018 00:13     Procedures   ____________________________________________   INITIAL IMPRESSION / ASSESSMENT AND PLAN / ED COURSE  As part of my medical decision making, I reviewed the following data within the electronic MEDICAL RECORD NUMBER   42year old female presenting to the emergency department above-stated history and physical exam.  Concern for possible perineal/vulvar abscess and as such CT pelvis performed.  Following CT patient with acute onset of dyspnea and hives and as such patient was given IV Benadryl 50 mg Solu-Medrol on 25 mg and Pepcid 20 mg with complete resolution of symptoms.  CT revealed no evidence of abscess.  I suspect the patient's symptoms to be secondary to initial genital herpes outbreak as she describes generalized body aches headache fever at home.  Patient afebrile in the emergency department.  Patient instructed to follow-up with primary care provider for further outpatient evaluation and management.  Patient was observed in the emergency department for 4 hours following CT  Clinical Course as of Oct 19 524  Wed Oct 18, 2018  2252 Temp: 98.1 F (36.7 C) [KP]    Clinical Course User Index [KP] PHarvest Dark MD    ____________________________________________  FINAL CLINICAL IMPRESSION(S) / ED DIAGNOSES  Final diagnoses:  Herpes simplex vulvovaginitis     MEDICATIONS GIVEN DURING THIS VISIT:  Medications  iopamidol (ISOVUE-300) 61 % injection 100 mL (100 mLs Intravenous Contrast Given 10/18/18 2349)  methylPREDNISolone sodium succinate (SOLU-MEDROL) 125 mg/2 mL injection 125 mg (125 mg Intravenous Given 10/19/18 0014)    famotidine (PEPCID) IVPB 20 mg premix (0 mg Intravenous Stopped 10/19/18 0113)  diphenhydrAMINE (BENADRYL) injection 50 mg (50 mg Intravenous Given 10/19/18 0013)     ED Discharge Orders    None       Note:  This document was prepared using Dragon voice recognition software and may include unintentional dictation errors.    BGregor Hams MD 10/19/18 [phone]

## 2018-10-18 NOTE — ED Triage Notes (Signed)
Pt presents to ED with concerns of worsening MRSA. Pt states she was dx last week with MRSA and Herpes after cutting herself shaving and noticing a boil a short time later. Has been taking antibiotics and antivirals since Friday. Pt states she has body aches fever and headache since Saturday in addition to a growing number of boils around the affected area. feels like she is continuing to decline instead of improve.

## 2018-10-18 NOTE — Telephone Encounter (Signed)
FYI...  Pt called complaining fevers (101.4 today), myalgias, fatigue and "non stop diarrhea" She states she was recently diagnosed with MRSA and HSV1, was prescribed Keflex for seven days.  She is not sure if the rash is worsening (Pt is not able to see) but she does report a slight increase in pain.    I advised her to go to an Urgent Care this evening to be evaluated.  Pt agreed and will go tonight.    Thanks,  -Mickel Baas

## 2018-10-18 NOTE — ED Notes (Signed)
Patient transported to CT 

## 2018-10-18 NOTE — ED Notes (Signed)
Pt states that she was diagnosed with MRSA but has progressively gotten worse in the last couple of days.

## 2018-10-19 DIAGNOSIS — B002 Herpesviral gingivostomatitis and pharyngotonsillitis: Secondary | ICD-10-CM | POA: Diagnosis not present

## 2018-10-19 LAB — WET PREP, GENITAL
Clue Cells Wet Prep HPF POC: NONE SEEN
Sperm: NONE SEEN
TRICH WET PREP: NONE SEEN
Yeast Wet Prep HPF POC: NONE SEEN

## 2018-10-19 LAB — CHLAMYDIA/NGC RT PCR (ARMC ONLY)
CHLAMYDIA TR: NOT DETECTED
N gonorrhoeae: NOT DETECTED

## 2018-10-19 MED ORDER — FAMOTIDINE IN NACL 20-0.9 MG/50ML-% IV SOLN
20.0000 mg | Freq: Once | INTRAVENOUS | Status: AC
  Administered 2018-10-19: 20 mg via INTRAVENOUS

## 2018-10-19 MED ORDER — DIPHENHYDRAMINE HCL 50 MG/ML IJ SOLN
50.0000 mg | Freq: Once | INTRAMUSCULAR | Status: AC
  Administered 2018-10-19: 50 mg via INTRAVENOUS

## 2018-10-19 MED ORDER — METHYLPREDNISOLONE SODIUM SUCC 125 MG IJ SOLR
125.0000 mg | Freq: Once | INTRAMUSCULAR | Status: AC
  Administered 2018-10-19: 125 mg via INTRAVENOUS

## 2018-10-19 NOTE — ED Notes (Signed)
Pt in no acute distress at this time. Pt states that she "feels fine" and there are minimal signs of hives on the patients neck and body. Will continue to monitor.

## 2018-10-19 NOTE — ED Notes (Signed)
After being given IV medications per MAR, pt neck swelling, hives, and shortness of breath receded and pt stated that she was feeling better. Will continue to monitor. Allergies updated in chart to reflect charting.

## 2018-10-19 NOTE — ED Notes (Addendum)
Pt states that her skin "feels tight on her body but feels that the swelling has gotten better than it was." Will continue to monitor.

## 2018-10-19 NOTE — ED Notes (Signed)
Pt states that she "feels the hives have started to go away and the swelling is slightly better at this time." MD notified that pt is feeling better and stated to continue to monitor.

## 2018-10-19 NOTE — ED Notes (Addendum)
Pt back from CT after being given IV contrast. Pt stated that she felt short of breath and was having difficulty breathing after getting back into bed. This nurse given verbal order per MD Owens Shark at bedside for 50 mg of benadryl, 125 mg solu-medrol, 20 mg pepcid due to pt having an allergic reaction to IV contrast. Pt presented with hives, shortness of breath, and neck swelling. Will administer medications per MD orders.

## 2018-10-20 ENCOUNTER — Encounter: Payer: Self-pay | Admitting: Family Medicine

## 2018-10-20 ENCOUNTER — Ambulatory Visit: Payer: Federal, State, Local not specified - PPO | Admitting: Family Medicine

## 2018-10-20 VITALS — BP 111/72 | HR 75 | Temp 98.5°F | Wt 283.6 lb

## 2018-10-20 DIAGNOSIS — A6004 Herpesviral vulvovaginitis: Secondary | ICD-10-CM | POA: Diagnosis not present

## 2018-10-20 DIAGNOSIS — L739 Follicular disorder, unspecified: Secondary | ICD-10-CM | POA: Diagnosis not present

## 2018-10-20 DIAGNOSIS — T508X5D Adverse effect of diagnostic agents, subsequent encounter: Secondary | ICD-10-CM | POA: Diagnosis not present

## 2018-10-20 DIAGNOSIS — L509 Urticaria, unspecified: Secondary | ICD-10-CM

## 2018-10-20 LAB — HIV ANTIBODY (ROUTINE TESTING W REFLEX): HIV Screen 4th Generation wRfx: NONREACTIVE

## 2018-10-20 LAB — RPR: RPR Ser Ql: NONREACTIVE

## 2018-10-20 MED ORDER — VALACYCLOVIR HCL 1 G PO TABS: 1000.0000 mg | ORAL_TABLET | Freq: Two times a day (BID) | ORAL | 0 refills | Status: AC

## 2018-10-20 MED ORDER — SULFAMETHOXAZOLE-TRIMETHOPRIM 800-160 MG PO TABS: 1.0000 | ORAL_TABLET | Freq: Two times a day (BID) | ORAL | 0 refills | Status: DC

## 2018-10-20 MED ORDER — EPINEPHRINE 0.3 MG/0.3ML IJ SOAJ: 0.3000 mg | Freq: Once | INTRAMUSCULAR | 0 refills | Status: AC

## 2018-10-20 MED ORDER — EPINEPHRINE 0.3 MG/0.3ML IJ SOAJ: 0.3000 mg | Freq: Once | INTRAMUSCULAR | 0 refills | Status: DC

## 2018-10-20 MED ORDER — VALACYCLOVIR HCL 500 MG PO TABS: 500.0000 mg | ORAL_TABLET | Freq: Two times a day (BID) | ORAL | 2 refills | Status: DC

## 2018-10-20 NOTE — Patient Instructions (Signed)
Hives Hives (urticaria) are itchy, red, swollen areas on your skin. Hives can appear on any part of your body and can vary in size. They can be as small as the tip of a pen or much larger. Hives often fade within 24 hours (acute hives). In other cases, new hives appear after old ones fade. This cycle can continue for several days or weeks (chronic hives). Hives result from your body's reaction to an irritant or to something that you are allergic to (trigger). When you are exposed to a trigger, your body releases a chemical (histamine) that causes redness, itching, and swelling. You can get hives immediately after being exposed to a trigger or hours later. Hives do not spread from person to person (are not contagious). Your hives may get worse with scratching, exercise, and emotional stress. What are the causes? Causes of this condition include:  Allergies to certain foods or ingredients.  Insect bites or stings.  Exposure to pollen or pet dander.  Contact with latex or chemicals.  Spending time in sunlight, heat, or cold (exposure).  Exercise.  Stress.  You can also get hives from some medical conditions and treatments. These include:  Viruses, including the common cold.  Bacterial infections, such as urinary tract infections and strep throat.  Disorders such as vasculitis, lupus, or thyroid disease.  Certain medications.  Allergy shots.  Blood transfusions.  Sometimes, the cause of hives is not known (idiopathic hives). What increases the risk? This condition is more likely to develop in:  Women.  People who have food allergies, especially to citrus fruits, milk, eggs, peanuts, tree nuts, or shellfish.  People who are allergic to: ? Medicines. ? Latex. ? Insects. ? Animals. ? Pollen.  People who have certain medical conditions, includinglupus or thyroid disease.  What are the signs or symptoms? The main symptom of this condition is raised, itchyred or white  bumps or patches on your skin. These areas may:  Become large and swollen (welts).  Change in shape and location, quickly and repeatedly.  Be separate hives or connect over a large area of skin.  Sting or become painful.  Turn white when pressed in the center (blanch).  In severe cases, yourhands, feet, and face may also become swollen. This may occur if hives develop deeper in your skin. How is this diagnosed? This condition is diagnosed based on your symptoms, medical history, and physical exam. Your skin, urine, or blood may be tested to find out what is causing your hives and to rule out other health issues. Your health care provider may also remove a small sample of skin from the affected area and examine it under a microscope (biopsy). How is this treated? Treatment depends on the severity of your condition. Your health care provider may recommend using cool, wet cloths (cool compresses) or taking cool showers to relieve itching. Hives are sometimes treated with medicines, including:  Antihistamines.  Corticosteroids.  Antibiotics.  An injectable medicine (omalizumab). Your health care provider may prescribe this if you have chronic idiopathic hives and you continue to have symptoms even after treatment with antihistamines.  Severe cases may require an emergency injection of adrenaline (epinephrine) to prevent a life-threatening allergic reaction (anaphylaxis). Follow these instructions at home: Medicines  Take or apply over-the-counter and prescription medicines only as told by your health care provider.  If you were prescribed an antibiotic medicine, use it as told by your health care provider. Do not stop taking the antibiotic even if you start  to feel better. Skin Care  Apply cool compresses to the affected areas.  Do not scratch or rub your skin. General instructions  Do not take hot showers or baths. This can make itching worse.  Do not wear tight-fitting  clothing.  Use sunscreen and wear protective clothing when you are outside.  Avoid any substances that cause your hives. Keep a journal to help you track what causes your hives. Write down: ? What medicines you take. ? What you eat and drink. ? What products you use on your skin.  Keep all follow-up visits as told by your health care provider. This is important. Contact a health care provider if:  Your symptoms are not controlled with medicine.  Your joints are painful or swollen. Get help right away if:  You have a fever.  You have pain in your abdomen.  Your tongue or lips are swollen.  Your eyelids are swollen.  Your chest or throat feels tight.  You have trouble breathing or swallowing. These symptoms may represent a serious problem that is an emergency. Do not wait to see if the symptoms will go away. Get medical help right away. Call your local emergency services (911 in the [place] Do not drive yourself to the hospital. This information is not intended to replace advice given to you by your health care provider. Make sure you discuss any questions you have with your health care provider. Document Released: 11/29/2005 Document Revised: 04/28/2016 Document Reviewed: 09/17/2015 Elsevier Interactive Patient Education  2018 [employer].

## 2018-10-20 NOTE — Progress Notes (Signed)
Patient: Brenda Ross Female    DOB: Jan 05, 1976   42 y.o.   MRN: [id number] Visit Date: 10/20/2018  Today's Provider: Lavon Paganini, MD   Chief Complaint  Patient presents with  . ER Follow Up   Subjective:    HPI  Follow up ER visit  Patient was seen in ER for increased number of boils, fever and body aches on 10/18/2018. She was treated for Herpes simplex vulvovaginitis and Folliculitis. Treatment for this included no changes. Patient reports she had a allergic reaction to the Iodine used during CT scan. She reports good compliance with treatment. She reports this condition is Improved, but patient is experiencing hives.  Taking benadryl for hives. Continues to have lesions of vulva.  She is not sure if these are from herpes or folliculitis She is on last day of treatment with Bactrim and Valtrex  ------------------------------------------------------------------------------------   Allergies  Allergen Reactions  . Iodinated Diagnostic Agents Hives, Shortness Of Breath and Swelling  . Eggs Or Egg-Derived Products Diarrhea  . Gluten Meal Diarrhea    Celiac's disease Celiac's disease Celiac's disease Celiac's disease Celiac's disease Celiac's disease Celiac's disease   . Pea Diarrhea     Current Outpatient Medications:  .  CARTIA XT 120 MG 24 hr capsule, Take 1 capsule by mouth daily., Disp: , Rfl:  .  Cholecalciferol (VITAMIN D PO), Take by mouth., Disp: , Rfl:  .  loratadine (CLARITIN) 10 MG tablet, Claritin 10 mg tablet  Take 1 tablet every day by oral route., Disp: , Rfl:  .  losartan (COZAAR) 100 MG tablet, Take 1 tablet by mouth daily., Disp: , Rfl:  .  pantoprazole (PROTONIX) 40 MG tablet, Take 1 tablet by mouth 2 (two) times daily., Disp: , Rfl:  .  ranitidine (ZANTAC) 150 MG tablet, Take 1 tablet by mouth 2 (two) times daily., Disp: , Rfl:  .  sulfamethoxazole-trimethoprim (BACTRIM DS,SEPTRA DS) 800-160 MG tablet, Take 1 tablet by mouth 2  (two) times daily., Disp: 6 tablet, Rfl: 0  Review of Systems  Constitutional: Negative.   Respiratory: Negative.   Cardiovascular: Negative.   Musculoskeletal: Negative.   Skin:       Hives     Social History   Tobacco Use  . Smoking status: Former Smoker    Packs/day: 1.00    Years: 3.00    Pack years: 3.00    Types: Cigarettes    Last attempt to quit: 01/14/1996    Years since quitting: 22.7  . Smokeless tobacco: Never Used  Substance Use Topics  . Alcohol use: Yes    Alcohol/week: 7.0 - 14.0 standard drinks    Types: 7 - 14 Glasses of wine per week    Frequency: Never   Objective:   BP 111/72 (BP Location: Left Arm, Patient Position: Sitting, Cuff Size: Large)   Pulse 75   Temp 98.5 F (36.9 C) (Oral)   Wt 283 lb 9.6 oz (128.6 kg)   SpO2 99%   BMI 48.68 kg/m  Vitals:   10/20/18 1333  BP: 111/72  Pulse: 75  Temp: 98.5 F (36.9 C)  TempSrc: Oral  SpO2: 99%  Weight: 283 lb 9.6 oz (128.6 kg)     Physical Exam  Constitutional: She is oriented to person, place, and time. She appears well-developed and well-nourished. No distress.  HENT:  Head: Normocephalic and atraumatic.  Mouth/Throat: Oropharynx is clear and moist. No oropharyngeal exudate.  Eyes: Conjunctivae are normal.  Cardiovascular: Normal  rate and regular rhythm.  No murmur heard. Pulmonary/Chest: Effort normal. No respiratory distress.  Genitourinary:  Genitourinary Comments: GYN:  External genitalia with small pustules and ulcerations.  Mostly healed.  Musculoskeletal: She exhibits no edema.  Neurological: She is alert and oriented to person, place, and time.  Skin: Skin is warm and dry. Capillary refill takes less than 2 seconds.  + diffuse hives in various stages  Psychiatric: Her behavior is normal. Her mood appears anxious. Her affect is labile.  Vitals reviewed.       Assessment & Plan:   1. Hives - likely 2/2 stress and recent allergic reaction - treat with Claritin and Zantac -  continue benadryl prn - Ambulatory referral to Allergy  2. Allergic reaction to contrast material, subsequent encounter - SOB, lip swelling to contrast dye - patient would like to have epi pen on hand - Ambulatory referral to Allergy  3. Folliculitis - mostly improved - area that was concerning for abscess has now decreased - will continue Bactirm course for 3 more days - retunr precautions discussed  4. Herpes simplex vulvovaginitis - patient + for HSV1 - treated with 7d of Valtrex 510m BID - this is a primary outbreak, so she likely would have been better treated with 1000 mg twice daily of Valtrex -As she still does have a few ulcerations, will extend her course by 3 days - Discussed what to do if she has another outbreak and she will have a prescription for Valtrex 500 mg twice daily for 3 days to take in case of an outbreak -We will also test her husband for HSV    Meds ordered this encounter  Medications  . sulfamethoxazole-trimethoprim (BACTRIM DS,SEPTRA DS) 800-160 MG tablet    Sig: Take 1 tablet by mouth 2 (two) times daily.    Dispense:  6 tablet    Refill:  0  . valACYclovir (VALTREX) 1000 MG tablet    Sig: Take 1 tablet (1,000 mg total) by mouth 2 (two) times daily for 3 days.    Dispense:  6 tablet    Refill:  0  . valACYclovir (VALTREX) 500 MG tablet    Sig: Take 1 tablet (500 mg total) by mouth 2 (two) times daily. As needed for future outbreaks    Dispense:  6 tablet    Refill:  2  . DISCONTD: EPINEPHrine 0.3 mg/0.3 mL IJ SOAJ injection    Sig: Inject 0.3 mLs (0.3 mg total) into the muscle once for 1 dose.    Dispense:  2 Device    Refill:  0  . EPINEPHrine 0.3 mg/0.3 mL IJ SOAJ injection    Sig: Inject 0.3 mLs (0.3 mg total) into the muscle once for 1 dose.    Dispense:  2 Device    Refill:  0     Return if symptoms worsen or fail to improve.   The entirety of the information documented in the History of Present Illness, Review of Systems and  Physical Exam were personally obtained by me. Portions of this information were initially documented by NTiburcio Pea CMA and reviewed by me for thoroughness and accuracy.    BVirginia Crews MD, MPH [facility]11/07/2018 2:16 PM

## 2018-10-23 ENCOUNTER — Ambulatory Visit
Admission: RE | Admit: 2018-10-23 | Discharge: 2018-10-23 | Disposition: A | Discharge status: Home / Self Care | Payer: Federal, State, Local not specified - PPO | Source: Ambulatory Visit | Attending: Physician Assistant | Admitting: Physician Assistant

## 2018-10-23 ENCOUNTER — Encounter: Payer: Self-pay | Admitting: Physician Assistant

## 2018-10-23 ENCOUNTER — Ambulatory Visit: Payer: Federal, State, Local not specified - PPO | Admitting: Physician Assistant

## 2018-10-23 ENCOUNTER — Telehealth: Payer: Self-pay | Admitting: Family Medicine

## 2018-10-23 VITALS — BP 100/60 | HR 76 | Temp 98.2°F | Resp 16 | Wt 181.2 lb

## 2018-10-23 DIAGNOSIS — R109 Unspecified abdominal pain: Secondary | ICD-10-CM | POA: Diagnosis not present

## 2018-10-23 DIAGNOSIS — R82998 Other abnormal findings in urine: Secondary | ICD-10-CM | POA: Diagnosis not present

## 2018-10-23 DIAGNOSIS — R311 Benign essential microscopic hematuria: Secondary | ICD-10-CM

## 2018-10-23 DIAGNOSIS — D649 Anemia, unspecified: Secondary | ICD-10-CM | POA: Diagnosis not present

## 2018-10-23 DIAGNOSIS — R8290 Unspecified abnormal findings in urine: Secondary | ICD-10-CM | POA: Diagnosis not present

## 2018-10-23 DIAGNOSIS — R10A2 Flank pain, left side: Secondary | ICD-10-CM

## 2018-10-23 DIAGNOSIS — R748 Abnormal levels of other serum enzymes: Secondary | ICD-10-CM

## 2018-10-23 DIAGNOSIS — R945 Abnormal results of liver function studies: Secondary | ICD-10-CM | POA: Diagnosis not present

## 2018-10-23 DIAGNOSIS — R710 Precipitous drop in hematocrit: Secondary | ICD-10-CM | POA: Diagnosis not present

## 2018-10-23 LAB — POCT URINALYSIS DIPSTICK
BILIRUBIN UA: NEGATIVE
Color, UA: NEGATIVE
GLUCOSE UA: NEGATIVE
Ketones, UA: NEGATIVE
Nitrite, UA: NEGATIVE
PH UA: 6 (ref 5.0–8.0)
Protein, UA: POSITIVE — AB
Spec Grav, UA: 1.02 (ref 1.010–1.025)
Urobilinogen, UA: 0.2 E.U./dL

## 2018-10-23 LAB — CULTURE, BLOOD (SINGLE)
Culture: NO GROWTH
Special Requests: ADEQUATE

## 2018-10-23 NOTE — Progress Notes (Signed)
Patient: Brenda Ross Female    DOB: [birth year]   42 y.o.   MRN: [id number] Visit Date: 10/23/2018  Today's Provider: Mar Daring, PA-C   No chief complaint on file.  Subjective:    HPI Patient here today with c/o arms,legs are hurting. Patient reports that she was seen last week. Reports that her muscles are hurting and weak. Reports that this is not normal and that she has never had this before. Patient reports that she is drinking lots water and not urinating as she should. Patient feels like something is not right and reports that she is in a lot of pain. She reports that her urine is dark, but denies seeing any blood . She wants her kidneys to be checked. Patient is still taking the Bactrim.  Father in law passed on 03-07-2023 in [place].    Allergies  Allergen Reactions  . Iodinated Diagnostic Agents Hives, Shortness Of Breath and Swelling  . Eggs Or Egg-Derived Products Diarrhea  . Gluten Meal Diarrhea    Celiac's disease  . Pea Diarrhea     Current Outpatient Medications:  .  CARTIA XT 120 MG 24 hr capsule, Take 1 capsule by mouth daily., Disp: , Rfl:  .  cetirizine (ZYRTEC) 10 MG tablet, Take 10 mg by mouth daily., Disp: , Rfl:  .  losartan (COZAAR) 100 MG tablet, Take 1 tablet by mouth daily., Disp: , Rfl:  .  pantoprazole (PROTONIX) 40 MG tablet, Take 1 tablet by mouth 2 (two) times daily., Disp: , Rfl:  .  ranitidine (ZANTAC) 150 MG tablet, Take 1 tablet by mouth 2 (two) times daily., Disp: , Rfl:  .  sulfamethoxazole-trimethoprim (BACTRIM DS,SEPTRA DS) 800-160 MG tablet, Take 1 tablet by mouth 2 (two) times daily., Disp: 6 tablet, Rfl: 0 .  valACYclovir (VALTREX) 1000 MG tablet, Take 1 tablet (1,000 mg total) by mouth 2 (two) times daily for 3 days., Disp: 6 tablet, Rfl: 0 .  valACYclovir (VALTREX) 500 MG tablet, Take 1 tablet (500 mg total) by mouth 2 (two) times daily. As needed for future outbreaks, Disp: 6 tablet, Rfl: 2  Review of Systems    Constitutional: Negative.   Respiratory: Negative.   Cardiovascular: Negative.   Gastrointestinal: Positive for abdominal pain and nausea. Negative for abdominal distention, anal bleeding, blood in stool, constipation, diarrhea and vomiting.  Genitourinary: Positive for dysuria, flank pain and frequency. Negative for urgency.  Musculoskeletal: Positive for back pain.  Neurological: Negative.     Social History   Tobacco Use  . Smoking status: Former Smoker    Packs/day: 1.00    Years: 3.00    Pack years: 3.00    Types: Cigarettes    Last attempt to quit: 01/14/1996    Years since quitting: 22.7  . Smokeless tobacco: Never Used  Substance Use Topics  . Alcohol use: Yes    Alcohol/week: 7.0 - 14.0 standard drinks    Types: 7 - 14 Glasses of wine per week    Frequency: Never   Objective:   BP 100/60 (BP Location: Left Wrist, Patient Position: Sitting, Cuff Size: Normal)   Pulse 76   Temp 98.2 F (36.8 C) (Oral)   Resp 16   Wt 181 lb 3.2 oz (82.2 kg)   BMI 31.10 kg/m  Vitals:   10/23/18 1443  BP: 100/60  Pulse: 76  Resp: 16  Temp: 98.2 F (36.8 C)  TempSrc: Oral  Weight: 181 lb 3.2 oz (82.2 kg)  Physical Exam  Constitutional: She is oriented to person, place, and time. She appears well-developed and well-nourished. No distress.  Cardiovascular: Normal rate, regular rhythm and normal heart sounds. Exam reveals no gallop and no friction rub.  No murmur heard. Pulmonary/Chest: Effort normal and breath sounds normal. No respiratory distress. She has no wheezes. She has no rales.  Abdominal: Soft. Normal appearance and bowel sounds are normal. She exhibits no distension and no mass. There is no hepatosplenomegaly. There is generalized tenderness and tenderness in the right upper quadrant and epigastric area. There is no rebound, no guarding and no CVA tenderness.  Musculoskeletal:       Lumbar back: Normal.  Neurological: She is alert and oriented to person, place,  and time.  Skin: Skin is warm and dry. She is not diaphoretic.  Vitals reviewed.       Assessment & Plan:     1. Dark urine UA unremarkable. Will send for culture. Will get imaging as below to see if possible renal stone or bowel source. Will check labs as below and f/u pending results.  - POCT urinalysis dipstick - CBC w/Diff/Platelet - Comprehensive Metabolic Panel (CMET) - Urine Culture - DG Abd 1 View; Future - amoxicillin-clavulanate (AUGMENTIN) 875-125 MG tablet; Take 1 tablet by mouth 2 (two) times daily.  Dispense: 14 tablet; Refill: 0  2. Left flank pain See above medical treatment plan. - CBC w/Diff/Platelet - Comprehensive Metabolic Panel (CMET) - Urine Culture - DG Abd 1 View; Future - amoxicillin-clavulanate (AUGMENTIN) 875-125 MG tablet; Take 1 tablet by mouth 2 (two) times daily.  Dispense: 14 tablet; Refill: 0  3. Benign essential microscopic hematuria See above medical treatment plan. - DG Abd 1 View; Future - amoxicillin-clavulanate (AUGMENTIN) 875-125 MG tablet; Take 1 tablet by mouth 2 (two) times daily.  Dispense: 14 tablet; Refill: 0       Mar Daring, PA-C  [facility]

## 2018-10-23 NOTE — Telephone Encounter (Signed)
OK. Noted. Tawanna Sat, let me know if you have any questions about her recent complicated infectious course.  Virginia Crews, MD, MPH [facility] 10/23/2018 10:53 AM

## 2018-10-23 NOTE — Telephone Encounter (Signed)
Advised patient as below. Patient wants her kidney checked today. She feels that something is not right and reports that she is in a lot of pain. She reports that her urine is very dark, but denies seeing any blood. She has tried tylenol with no relief. Appt was scheduled today to see Jenni at 2:40.

## 2018-10-23 NOTE — Telephone Encounter (Signed)
Could be related to recent illness or all of the medications.  Should be finishing course of medications.  Stay hydrated. Take some Tylenol/ibuprofen and see if it improves.  May take a day or 2.  If urination stays decreased or very dark in color, can check kidney function and ensure no UTI  Virginia Crews, MD, MPH [facility] 10/23/2018 9:42 AM

## 2018-10-23 NOTE — Telephone Encounter (Signed)
Pt's arms and legs are hurting and having pain.  Pt is drinking lots of water and not urinating as much as she should. Pt hasn't felt normal since last weeks issue.  Please advise.  Thanks, [employer]

## 2018-10-24 ENCOUNTER — Telehealth: Payer: Self-pay | Admitting: Family Medicine

## 2018-10-24 LAB — CBC WITH DIFFERENTIAL/PLATELET
BASOS: 0 %
Basophils Absolute: 0 10*3/uL (ref 0.0–0.2)
EOS (ABSOLUTE): 0 10*3/uL (ref 0.0–0.4)
Eos: 0 %
Hematocrit: 33.4 % — ABNORMAL LOW (ref 34.0–46.6)
Hemoglobin: 10.6 g/dL — ABNORMAL LOW (ref 11.1–15.9)
IMMATURE GRANULOCYTES: 1 %
Immature Grans (Abs): 0 10*3/uL (ref 0.0–0.1)
LYMPHS ABS: 1.5 10*3/uL (ref 0.7–3.1)
Lymphs: 31 %
MCH: 24.7 pg — AB (ref 26.6–33.0)
MCHC: 31.7 g/dL (ref 31.5–35.7)
MCV: 78 fL — AB (ref 79–97)
MONOS ABS: 0.3 10*3/uL (ref 0.1–0.9)
Monocytes: 7 %
NEUTROS ABS: 3 10*3/uL (ref 1.4–7.0)
NEUTROS PCT: 61 %
PLATELETS: 230 10*3/uL (ref 150–450)
RBC: 4.29 x10E6/uL (ref 3.77–5.28)
RDW: 14.7 % (ref 12.3–15.4)
WBC: 4.9 10*3/uL (ref 3.4–10.8)

## 2018-10-24 LAB — COMPREHENSIVE METABOLIC PANEL
A/G RATIO: 1.2 (ref 1.2–2.2)
ALT: 95 IU/L — AB (ref 0–32)
AST: 53 IU/L — AB (ref 0–40)
Albumin: 3.7 g/dL (ref 3.5–5.5)
Alkaline Phosphatase: 78 IU/L (ref 39–117)
BILIRUBIN TOTAL: 0.2 mg/dL (ref 0.0–1.2)
BUN/Creatinine Ratio: 11 (ref 9–23)
BUN: 9 mg/dL (ref 6–24)
CO2: 20 mmol/L (ref 20–29)
Calcium: 8.6 mg/dL — ABNORMAL LOW (ref 8.7–10.2)
Chloride: 103 mmol/L (ref 96–106)
Creatinine, Ser: 0.79 mg/dL (ref 0.57–1.00)
GFR calc non Af Amer: 92 mL/min/{1.73_m2} (ref >59)
GFR, EST AFRICAN AMERICAN: 106 mL/min/{1.73_m2} (ref >59)
Globulin, Total: 3.1 g/dL (ref 1.5–4.5)
Glucose: 96 mg/dL (ref 65–99)
Potassium: 3.7 mmol/L (ref 3.5–5.2)
Sodium: 138 mmol/L (ref 134–144)
Total Protein: 6.8 g/dL (ref 6.0–8.5)

## 2018-10-24 NOTE — Telephone Encounter (Signed)
Please advise 

## 2018-10-24 NOTE — Telephone Encounter (Signed)
Pt calling back to check on all results.  Please review and disclose results asap.  Thanks, [employer]

## 2018-10-25 LAB — URINE CULTURE

## 2018-10-25 MED ORDER — AMOXICILLIN-POT CLAVULANATE 875-125 MG PO TABS: 1.0000 | ORAL_TABLET | Freq: Two times a day (BID) | ORAL | 0 refills | Status: DC

## 2018-10-25 NOTE — Telephone Encounter (Signed)
Will forward to [place].  Virginia Crews, MD, MPH [facility] 10/25/2018 8:03 AM

## 2018-10-25 NOTE — Telephone Encounter (Signed)
See result note.  

## 2018-10-26 ENCOUNTER — Encounter: Payer: Self-pay | Admitting: Physician Assistant

## 2018-10-27 ENCOUNTER — Telehealth: Payer: Self-pay

## 2018-10-27 ENCOUNTER — Other Ambulatory Visit: Payer: Self-pay | Admitting: Family Medicine

## 2018-10-27 NOTE — Telephone Encounter (Signed)
Patient advised as directed below. Per patient she is not going to take the iron because it messes her stomach up and she states "I am not going to put my self through that". Patient is also requesting Dr.B to send in a prescription for Valtrex of 1033m twice a day to [facility] Explained to patient that her prescriptions said 5041mtablet twice a day and she reports that when she spoke with Dr. B that she said she needed to be taking 1000 mg twice a day?  She is asking if this can be send to [facility] that she can pick it up together with the [place] [place] for the UTI. She is also requesting a call once the prescription has been send.

## 2018-10-27 NOTE — Telephone Encounter (Signed)
-----   Message from Mar Daring, [place] sent at 10/26/2018  2:06 PM EST ----- Iron stores remain low but slight improvement since last check. Are you taking iron supplement? If not, would recommend to start ferrous sulfate 332m BID (ferrous gluconate slow release if GI side effects occur). Hepatitis panel was negative. Suspect elevated liver enzymes to be secondary to stress on the body from the multiple infections. Would recommend to recheck labs once antibiotic completed to see if return to normal. Will treat the UTI and see if symptoms improve as well. Call if symptoms worsen in the meantime or do not completely resolve.

## 2018-10-27 NOTE — Telephone Encounter (Signed)
To clarify, for future outbreaks, patient should take the 585m BID.  For initial outbreak, it was supposed to be 10037mBID.  The Rx that was sent is correct  BaVirginia CrewsMD, MPH [facility]/15/2019 12:07 PM

## 2018-10-28 MED ORDER — VALACYCLOVIR HCL 500 MG PO TABS: 500.0000 mg | ORAL_TABLET | Freq: Two times a day (BID) | ORAL | 2 refills | Status: DC

## 2018-10-30 ENCOUNTER — Encounter: Payer: Self-pay | Admitting: Family Medicine

## 2018-11-12 DIAGNOSIS — G4733 Obstructive sleep apnea (adult) (pediatric): Secondary | ICD-10-CM | POA: Diagnosis not present

## 2018-11-16 LAB — HEPATITIS PANEL, ACUTE
HEP A IGM: NEGATIVE
Hep B C IgM: NEGATIVE
Hepatitis B Surface Ag: NEGATIVE

## 2018-11-16 LAB — IRON,TIBC AND FERRITIN PANEL
FERRITIN: 84 ng/mL (ref 15–150)
IRON SATURATION: 8 % — AB (ref 15–55)
IRON: 25 ug/dL — AB (ref 27–159)
Total Iron Binding Capacity: 312 ug/dL (ref 250–450)
UIBC: 287 ug/dL (ref 131–425)

## 2018-11-16 LAB — SPECIMEN STATUS REPORT

## 2018-11-24 DIAGNOSIS — I10 Essential (primary) hypertension: Secondary | ICD-10-CM | POA: Diagnosis not present

## 2018-11-24 DIAGNOSIS — G4733 Obstructive sleep apnea (adult) (pediatric): Secondary | ICD-10-CM | POA: Diagnosis not present

## 2018-11-27 DIAGNOSIS — N921 Excessive and frequent menstruation with irregular cycle: Secondary | ICD-10-CM | POA: Diagnosis not present

## 2018-11-27 DIAGNOSIS — T8332XA Displacement of intrauterine contraceptive device, initial encounter: Secondary | ICD-10-CM | POA: Diagnosis not present

## 2018-11-27 DIAGNOSIS — N939 Abnormal uterine and vaginal bleeding, unspecified: Secondary | ICD-10-CM | POA: Diagnosis not present

## 2018-11-27 DIAGNOSIS — D251 Intramural leiomyoma of uterus: Secondary | ICD-10-CM | POA: Diagnosis not present

## 2018-11-27 DIAGNOSIS — A609 Anogenital herpesviral infection, unspecified: Secondary | ICD-10-CM | POA: Diagnosis not present

## 2018-11-27 DIAGNOSIS — D259 Leiomyoma of uterus, unspecified: Secondary | ICD-10-CM | POA: Diagnosis not present

## 2018-11-28 DIAGNOSIS — M9902 Segmental and somatic dysfunction of thoracic region: Secondary | ICD-10-CM | POA: Diagnosis not present

## 2018-11-28 DIAGNOSIS — M546 Pain in thoracic spine: Secondary | ICD-10-CM | POA: Diagnosis not present

## 2018-11-28 DIAGNOSIS — M9901 Segmental and somatic dysfunction of cervical region: Secondary | ICD-10-CM | POA: Diagnosis not present

## 2018-11-28 DIAGNOSIS — M50322 Other cervical disc degeneration at C5-C6 level: Secondary | ICD-10-CM | POA: Diagnosis not present

## 2018-12-04 DIAGNOSIS — M50322 Other cervical disc degeneration at C5-C6 level: Secondary | ICD-10-CM | POA: Diagnosis not present

## 2018-12-04 DIAGNOSIS — M9902 Segmental and somatic dysfunction of thoracic region: Secondary | ICD-10-CM | POA: Diagnosis not present

## 2018-12-04 DIAGNOSIS — M546 Pain in thoracic spine: Secondary | ICD-10-CM | POA: Diagnosis not present

## 2018-12-04 DIAGNOSIS — M9901 Segmental and somatic dysfunction of cervical region: Secondary | ICD-10-CM | POA: Diagnosis not present

## 2018-12-13 DIAGNOSIS — G4733 Obstructive sleep apnea (adult) (pediatric): Secondary | ICD-10-CM | POA: Diagnosis not present

## 2018-12-14 DIAGNOSIS — M50322 Other cervical disc degeneration at C5-C6 level: Secondary | ICD-10-CM | POA: Diagnosis not present

## 2018-12-14 DIAGNOSIS — M9901 Segmental and somatic dysfunction of cervical region: Secondary | ICD-10-CM | POA: Diagnosis not present

## 2018-12-14 DIAGNOSIS — M546 Pain in thoracic spine: Secondary | ICD-10-CM | POA: Diagnosis not present

## 2018-12-14 DIAGNOSIS — M9902 Segmental and somatic dysfunction of thoracic region: Secondary | ICD-10-CM | POA: Diagnosis not present

## 2018-12-15 DIAGNOSIS — K080 Exfoliation of teeth due to systemic causes: Secondary | ICD-10-CM | POA: Diagnosis not present

## 2018-12-18 DIAGNOSIS — M9902 Segmental and somatic dysfunction of thoracic region: Secondary | ICD-10-CM | POA: Diagnosis not present

## 2018-12-18 DIAGNOSIS — M50322 Other cervical disc degeneration at C5-C6 level: Secondary | ICD-10-CM | POA: Diagnosis not present

## 2018-12-18 DIAGNOSIS — M546 Pain in thoracic spine: Secondary | ICD-10-CM | POA: Diagnosis not present

## 2018-12-18 DIAGNOSIS — M9901 Segmental and somatic dysfunction of cervical region: Secondary | ICD-10-CM | POA: Diagnosis not present

## 2018-12-21 DIAGNOSIS — M9901 Segmental and somatic dysfunction of cervical region: Secondary | ICD-10-CM | POA: Diagnosis not present

## 2018-12-21 DIAGNOSIS — M9902 Segmental and somatic dysfunction of thoracic region: Secondary | ICD-10-CM | POA: Diagnosis not present

## 2018-12-21 DIAGNOSIS — M546 Pain in thoracic spine: Secondary | ICD-10-CM | POA: Diagnosis not present

## 2018-12-21 DIAGNOSIS — M50322 Other cervical disc degeneration at C5-C6 level: Secondary | ICD-10-CM | POA: Diagnosis not present

## 2019-01-01 DIAGNOSIS — M9902 Segmental and somatic dysfunction of thoracic region: Secondary | ICD-10-CM | POA: Diagnosis not present

## 2019-01-01 DIAGNOSIS — M546 Pain in thoracic spine: Secondary | ICD-10-CM | POA: Diagnosis not present

## 2019-01-01 DIAGNOSIS — M9901 Segmental and somatic dysfunction of cervical region: Secondary | ICD-10-CM | POA: Diagnosis not present

## 2019-01-01 DIAGNOSIS — M50322 Other cervical disc degeneration at C5-C6 level: Secondary | ICD-10-CM | POA: Diagnosis not present

## 2019-01-05 DIAGNOSIS — M9901 Segmental and somatic dysfunction of cervical region: Secondary | ICD-10-CM | POA: Diagnosis not present

## 2019-01-05 DIAGNOSIS — M9902 Segmental and somatic dysfunction of thoracic region: Secondary | ICD-10-CM | POA: Diagnosis not present

## 2019-01-05 DIAGNOSIS — M546 Pain in thoracic spine: Secondary | ICD-10-CM | POA: Diagnosis not present

## 2019-01-05 DIAGNOSIS — M50322 Other cervical disc degeneration at C5-C6 level: Secondary | ICD-10-CM | POA: Diagnosis not present

## 2019-01-09 DIAGNOSIS — M9902 Segmental and somatic dysfunction of thoracic region: Secondary | ICD-10-CM | POA: Diagnosis not present

## 2019-01-09 DIAGNOSIS — M546 Pain in thoracic spine: Secondary | ICD-10-CM | POA: Diagnosis not present

## 2019-01-09 DIAGNOSIS — M50322 Other cervical disc degeneration at C5-C6 level: Secondary | ICD-10-CM | POA: Diagnosis not present

## 2019-01-09 DIAGNOSIS — M9901 Segmental and somatic dysfunction of cervical region: Secondary | ICD-10-CM | POA: Diagnosis not present

## 2019-01-10 DIAGNOSIS — T781XXA Other adverse food reactions, not elsewhere classified, initial encounter: Secondary | ICD-10-CM | POA: Diagnosis not present

## 2019-01-10 DIAGNOSIS — J309 Allergic rhinitis, unspecified: Secondary | ICD-10-CM | POA: Diagnosis not present

## 2019-01-10 DIAGNOSIS — H1045 Other chronic allergic conjunctivitis: Secondary | ICD-10-CM | POA: Diagnosis not present

## 2019-01-10 DIAGNOSIS — J3089 Other allergic rhinitis: Secondary | ICD-10-CM | POA: Diagnosis not present

## 2019-01-10 DIAGNOSIS — L501 Idiopathic urticaria: Secondary | ICD-10-CM | POA: Diagnosis not present

## 2019-01-11 DIAGNOSIS — M9901 Segmental and somatic dysfunction of cervical region: Secondary | ICD-10-CM | POA: Diagnosis not present

## 2019-01-11 DIAGNOSIS — M546 Pain in thoracic spine: Secondary | ICD-10-CM | POA: Diagnosis not present

## 2019-01-11 DIAGNOSIS — M9902 Segmental and somatic dysfunction of thoracic region: Secondary | ICD-10-CM | POA: Diagnosis not present

## 2019-01-11 DIAGNOSIS — M50322 Other cervical disc degeneration at C5-C6 level: Secondary | ICD-10-CM | POA: Diagnosis not present

## 2019-01-18 DIAGNOSIS — G4733 Obstructive sleep apnea (adult) (pediatric): Secondary | ICD-10-CM | POA: Diagnosis not present

## 2019-01-19 DIAGNOSIS — M50322 Other cervical disc degeneration at C5-C6 level: Secondary | ICD-10-CM | POA: Diagnosis not present

## 2019-01-19 DIAGNOSIS — M9902 Segmental and somatic dysfunction of thoracic region: Secondary | ICD-10-CM | POA: Diagnosis not present

## 2019-01-19 DIAGNOSIS — M9901 Segmental and somatic dysfunction of cervical region: Secondary | ICD-10-CM | POA: Diagnosis not present

## 2019-01-19 DIAGNOSIS — M546 Pain in thoracic spine: Secondary | ICD-10-CM | POA: Diagnosis not present

## 2019-01-25 DIAGNOSIS — M9901 Segmental and somatic dysfunction of cervical region: Secondary | ICD-10-CM | POA: Diagnosis not present

## 2019-01-25 DIAGNOSIS — M546 Pain in thoracic spine: Secondary | ICD-10-CM | POA: Diagnosis not present

## 2019-01-25 DIAGNOSIS — M9902 Segmental and somatic dysfunction of thoracic region: Secondary | ICD-10-CM | POA: Diagnosis not present

## 2019-01-25 DIAGNOSIS — M50322 Other cervical disc degeneration at C5-C6 level: Secondary | ICD-10-CM | POA: Diagnosis not present

## 2019-01-29 DIAGNOSIS — M546 Pain in thoracic spine: Secondary | ICD-10-CM | POA: Diagnosis not present

## 2019-01-29 DIAGNOSIS — M50322 Other cervical disc degeneration at C5-C6 level: Secondary | ICD-10-CM | POA: Diagnosis not present

## 2019-01-29 DIAGNOSIS — M9901 Segmental and somatic dysfunction of cervical region: Secondary | ICD-10-CM | POA: Diagnosis not present

## 2019-01-29 DIAGNOSIS — M9902 Segmental and somatic dysfunction of thoracic region: Secondary | ICD-10-CM | POA: Diagnosis not present

## 2019-02-27 DIAGNOSIS — G4733 Obstructive sleep apnea (adult) (pediatric): Secondary | ICD-10-CM | POA: Diagnosis not present

## 2019-03-02 ENCOUNTER — Ambulatory Visit: Payer: Federal, State, Local not specified - PPO | Admitting: Family Medicine

## 2019-03-02 ENCOUNTER — Other Ambulatory Visit: Payer: Self-pay

## 2019-03-02 ENCOUNTER — Encounter: Payer: Self-pay | Admitting: Family Medicine

## 2019-03-02 ENCOUNTER — Encounter: Payer: Federal, State, Local not specified - PPO | Admitting: Family Medicine

## 2019-03-02 VITALS — BP 131/75 | HR 81

## 2019-03-02 DIAGNOSIS — B078 Other viral warts: Secondary | ICD-10-CM

## 2019-03-02 DIAGNOSIS — H6983 Other specified disorders of Eustachian tube, bilateral: Secondary | ICD-10-CM

## 2019-03-02 NOTE — Patient Instructions (Addendum)
Salicylic acid for wart  Each night after soaking and scrubbing Then apply duct tape Wash hands in the morning  Add flonase and zyrtec to singulair   Eustachian Tube Dysfunction  Eustachian tube dysfunction refers to a condition in which a blockage develops in the narrow passage that connects the middle ear to the back of the nose (eustachian tube). The eustachian tube regulates air pressure in the middle ear by letting air move between the ear and nose. It also helps to drain fluid from the middle ear space. Eustachian tube dysfunction can affect one or both ears. When the eustachian tube does not function properly, air pressure, fluid, or both can build up in the middle ear. What are the causes? This condition occurs when the eustachian tube becomes blocked or cannot open normally. Common causes of this condition include:  Ear infections.  Colds and other infections that affect the nose, mouth, and throat (upper respiratory tract).  Allergies.  Irritation from cigarette smoke.  Irritation from stomach acid coming up into the esophagus (gastroesophageal reflux). The esophagus is the tube that carries food from the mouth to the stomach.  Sudden changes in air pressure, such as from descending in an airplane or scuba diving.  Abnormal growths in the nose or throat, such as: ? Growths that line the nose (nasal polyps). ? Abnormal growth of cells (tumors). ? Enlarged tissue at the back of the throat (adenoids). What increases the risk? You are more likely to develop this condition if:  You smoke.  You are overweight.  You are a child who has: ? Certain birth defects of the mouth, such as cleft palate. ? Large tonsils or adenoids. What are the signs or symptoms? Common symptoms of this condition include:  A feeling of fullness in the ear.  Ear pain.  Clicking or popping noises in the ear.  Ringing in the ear.  Hearing loss.  Loss of balance.  Dizziness. Symptoms  may get worse when the air pressure around you changes, such as when you travel to an area of high elevation, fly on an airplane, or go scuba diving. How is this diagnosed? This condition may be diagnosed based on:  Your symptoms.  A physical exam of your ears, nose, and throat.  Tests, such as those that measure: ? The movement of your eardrum (tympanogram). ? Your hearing (audiometry). How is this treated? Treatment depends on the cause and severity of your condition.  In mild cases, you may relieve your symptoms by moving air into your ears. This is called "popping the ears."  In more severe cases, or if you have symptoms of fluid in your ears, treatment may include: ? Medicines to relieve congestion (decongestants). ? Medicines that treat allergies (antihistamines). ? Nasal sprays or ear drops that contain medicines that reduce swelling (steroids). ? A procedure to drain the fluid in your eardrum (myringotomy). In this procedure, a small tube is placed in the eardrum to:  Drain the fluid.  Restore the air in the middle ear space. ? A procedure to insert a balloon device through the nose to inflate the opening of the eustachian tube (balloon dilation). Follow these instructions at home: Lifestyle  Do not do any of the following until your health care provider approves: ? Travel to high altitudes. ? Fly in airplanes. ? Work in a Pension scheme manager or room. ? Scuba dive.  Do not use any products that contain nicotine or tobacco, such as cigarettes and e-cigarettes. If you need  help quitting, ask your health care provider.  Keep your ears dry. Wear fitted earplugs during showering and bathing. Dry your ears completely after. General instructions  Take over-the-counter and prescription medicines only as told by your health care provider.  Use techniques to help pop your ears as recommended by your health care provider. These may include: ? Chewing  gum. ? Yawning. ? Frequent, forceful swallowing. ? Closing your mouth, holding your nose closed, and gently blowing as if you are trying to blow air out of your nose.  Keep all follow-up visits as told by your health care provider. This is important. Contact a health care provider if:  Your symptoms do not go away after treatment.  Your symptoms come back after treatment.  You are unable to pop your ears.  You have: ? A fever. ? Pain in your ear. ? Pain in your head or neck. ? Fluid draining from your ear.  Your hearing suddenly changes.  You become very dizzy.  You lose your balance. Summary  Eustachian tube dysfunction refers to a condition in which a blockage develops in the eustachian tube.  It can be caused by ear infections, allergies, inhaled irritants, or abnormal growths in the nose or throat.  Symptoms include ear pain, hearing loss, or ringing in the ears.  Mild cases are treated with maneuvers to unblock the ears, such as yawning or ear popping.  Severe cases are treated with medicines. Surgery may also be done (rare). This information is not intended to replace advice given to you by your health care provider. Make sure you discuss any questions you have with your health care provider. Document Released: 12/26/2015 Document Revised: 03/21/2018 Document Reviewed: 03/21/2018 Elsevier Interactive Patient Education  2019 [employer].

## 2019-03-02 NOTE — Progress Notes (Deleted)
Patient: Brenda Ross, Female    DOB: Oct 23, 1976, 43 y.o.   MRN: [id number] Visit Date: 03/02/2019  Today's Provider: Lavon Paganini, MD   No chief complaint on file.  Subjective:     Annual physical exam Brenda Ross is a 43 y.o. female who presents today for health maintenance and complete physical. She feels {DESC; WELL/FAIRLY WELL/POORLY:18703}. She reports exercising ***. She reports she is sleeping {DESC; WELL/FAIRLY WELL/POORLY:18703}.  -----------------------------------------------------------------   Review of Systems  Constitutional: Negative.   HENT: Negative.   Eyes: Negative.   Respiratory: Negative.   Cardiovascular: Negative.   Gastrointestinal: Negative.   Endocrine: Negative.   Genitourinary: Negative.   Musculoskeletal: Negative.   Skin: Negative.   Allergic/Immunologic: Negative.   Neurological: Negative.   Hematological: Negative.   Psychiatric/Behavioral: Negative.     Social History      She  reports that she quit smoking about 23 years ago. Her smoking use included cigarettes. She has a 3.00 pack-year smoking history. She has never used smokeless tobacco. She reports current alcohol use of about 7.0 - 14.0 standard drinks of alcohol per week. She reports that she does not use drugs.       Social History   Socioeconomic History  . Marital status: Married    Spouse name: Not on file  . Number of children: 1  . Years of education: Not on file  . Highest education level: Not on file  Occupational History  . Not on file  Social Needs  . Financial resource strain: Not on file  . Food insecurity:    Worry: Not on file    Inability: Not on file  . Transportation needs:    Medical: Not on file    Non-medical: Not on file  Tobacco Use  . Smoking status: Former Smoker    Packs/day: 1.00    Years: 3.00    Pack years: 3.00    Types: Cigarettes    Last attempt to quit: 01/14/1996    Years since quitting: 23.1  . Smokeless  tobacco: Never Used  Substance and Sexual Activity  . Alcohol use: Yes    Alcohol/week: 7.0 - 14.0 standard drinks    Types: 7 - 14 Glasses of wine per week    Frequency: Never  . Drug use: Never  . Sexual activity: Yes    Partners: Male    Birth control/protection: I.U.D.  Lifestyle  . Physical activity:    Days per week: Not on file    Minutes per session: Not on file  . Stress: Not on file  Relationships  . Social connections:    Talks on phone: Not on file    Gets together: Not on file    Attends religious service: Not on file    Active member of club or organization: Not on file    Attends meetings of clubs or organizations: Not on file    Relationship status: Not on file  Other Topics Concern  . Not on file  Social History Narrative  . Not on file    Past Medical History:  Diagnosis Date  . Allergic rhinitis   . Anemia   . Anxiety   . GERD (gastroesophageal reflux disease)   . Herpes genitalia   . Prediabetes   . Sleep apnea      Patient Active Problem List   Diagnosis Date Noted  . Genital herpes simplex 10/17/2018  . Essential hypertension 09/01/2018  . Avitaminosis D 09/01/2018  .  Morbid obesity ([place]) 09/01/2018  . Palpitations 09/01/2018  . GERD (gastroesophageal reflux disease)   . Prediabetes   . Sleep apnea   . Anxiety   . Anemia   . Allergic rhinitis     Past Surgical History:  Procedure Laterality Date  . APPENDECTOMY      Family History        Family Status  Relation Name Status  . Mother  Alive  . Father  Alive  . Brother  Alive  . Brother  Alive  . Brother  Alive  . Neg Hx  (Not Specified)        Her family history includes ADD / ADHD in her brother and brother; Diabetes in her brother, father, and mother; Heart disease in her father and mother; Hypertension in her father and mother; Prostate cancer in her father. There is no history of Breast cancer, Colon cancer, or Ovarian cancer.      Allergies  Allergen Reactions  .  Iodinated Diagnostic Agents Hives, Shortness Of Breath and Swelling  . Eggs Or Egg-Derived Products Diarrhea  . Gluten Meal Diarrhea    Celiac's disease  . Pea Diarrhea     Current Outpatient Medications:  .  CARTIA XT 120 MG 24 hr capsule, Take 1 capsule by mouth daily., Disp: , Rfl:  .  cetirizine (ZYRTEC) 10 MG tablet, Take 10 mg by mouth daily., Disp: , Rfl:  .  losartan (COZAAR) 100 MG tablet, Take 1 tablet by mouth daily., Disp: , Rfl:  .  pantoprazole (PROTONIX) 40 MG tablet, Take 1 tablet by mouth 2 (two) times daily., Disp: , Rfl:  .  ranitidine (ZANTAC) 150 MG tablet, Take 1 tablet by mouth 2 (two) times daily., Disp: , Rfl:  .  valACYclovir (VALTREX) 500 MG tablet, Take 1 tablet (500 mg total) by mouth 2 (two) times daily. As needed for future outbreaks, Disp: 6 tablet, Rfl: 2   Patient Care Team: Bacigalupo, Dionne Bucy, MD as PCP - General (Family Medicine)    Objective:    Vitals: There were no vitals taken for this visit.  There were no vitals filed for this visit.   Physical Exam   Depression Screen PHQ 2/9 Scores 09/01/2018  PHQ - 2 Score 0  PHQ- 9 Score 8       Assessment & Plan:     Routine Health Maintenance and Physical Exam  Exercise Activities and Dietary recommendations Goals   None     Immunization History  Administered Date(s) Administered  . Tdap 08/06/2011    Health Maintenance  Topic Date Due  . INFLUENZA VACCINE  04/13/2019 (Originally 07/13/2018)  . PAP SMEAR-Modifier  07/27/2020  . TETANUS/TDAP  08/05/2021  . HIV Screening  Completed     Discussed health benefits of physical activity, and encouraged her to engage in regular exercise appropriate for her age and condition.    --------------------------------------------------------------------    Lavon Paganini, MD  [place]

## 2019-03-02 NOTE — Progress Notes (Signed)
Patient: Brenda Ross Female    DOB: 28-Jan-1976   43 y.o.   MRN: [id number] Visit Date: 03/02/2019  Today's Provider: Lavon Paganini, MD   Chief Complaint  Patient presents with  . Ear Pain   Subjective:    I, Brenda Ross CMA, am acting as a scribe for Lavon Paganini, MD.    Otalgia   There is pain in both (Right ear is the worst) ears. The current episode started more than 1 month ago. The problem occurs constantly. The problem has been unchanged. There has been no fever. The pain is at a severity of 6/10. The pain is mild. She has tried nothing for the symptoms. The treatment provided no relief.    She is taking Singulair dialy, but allergies are starting up   She has had wart on L index finger for ~9 months and it has not gone down.    Allergies  Allergen Reactions  . Iodinated Diagnostic Agents Hives, Shortness Of Breath and Swelling  . Eggs Or Egg-Derived Products Diarrhea  . Gluten Meal Diarrhea    Celiac's disease  . Pea Diarrhea     Current Outpatient Medications:  .  CARTIA XT 120 MG 24 hr capsule, Take 1 capsule by mouth daily., Disp: , Rfl:  .  losartan (COZAAR) 100 MG tablet, Take 1 tablet by mouth daily., Disp: , Rfl:  .  montelukast (SINGULAIR) 10 MG tablet, Take 10 mg by mouth daily., Disp: , Rfl:  .  pantoprazole (PROTONIX) 40 MG tablet, Take 1 tablet by mouth 2 (two) times daily., Disp: , Rfl:  .  ranitidine (ZANTAC) 150 MG tablet, Take 1 tablet by mouth 2 (two) times daily., Disp: , Rfl:  .  valACYclovir (VALTREX) 500 MG tablet, Take 1 tablet (500 mg total) by mouth 2 (two) times daily. As needed for future outbreaks, Disp: 6 tablet, Rfl: 2 .  cetirizine (ZYRTEC) 10 MG tablet, Take 10 mg by mouth daily., Disp: , Rfl:   Review of Systems  HENT: Positive for congestion, ear pain, postnasal drip and sinus pressure.   Respiratory: Negative.   Cardiovascular: Negative.   Genitourinary: Negative.   Musculoskeletal: Negative.    Neurological: Negative.   Hematological: Negative.   Psychiatric/Behavioral: Negative.     Social History   Tobacco Use  . Smoking status: Former Smoker    Packs/day: 1.00    Years: 3.00    Pack years: 3.00    Types: Cigarettes    Last attempt to quit: 01/14/1996    Years since quitting: 23.1  . Smokeless tobacco: Never Used  Substance Use Topics  . Alcohol use: Yes    Alcohol/week: 7.0 - 14.0 standard drinks    Types: 7 - 14 Glasses of wine per week    Frequency: Never      Objective:   BP 131/75 (BP Location: Right Arm, Patient Position: Sitting, Cuff Size: Large)   Pulse 81   SpO2 97%  Vitals:   03/02/19 1517  BP: 131/75  Pulse: 81  SpO2: 97%     Physical Exam Vitals signs reviewed.  Constitutional:      General: She is not in acute distress.    Appearance: She is well-developed.  HENT:     Head: Normocephalic and atraumatic.     Right Ear: Tympanic membrane, ear canal and external ear normal.     Left Ear: Tympanic membrane, ear canal and external ear normal.     Nose: Congestion present.  Mouth/Throat:     Mouth: Mucous membranes are moist.     Pharynx: Oropharynx is clear. No oropharyngeal exudate or posterior oropharyngeal erythema.  Eyes:     General: No scleral icterus.    Conjunctiva/sclera: Conjunctivae normal.  Neck:     Musculoskeletal: Neck supple.  Cardiovascular:     Rate and Rhythm: Normal rate and regular rhythm.     Pulses: Normal pulses.     Heart sounds: Normal heart sounds. No murmur.  Pulmonary:     Effort: Pulmonary effort is normal. No respiratory distress.     Breath sounds: Normal breath sounds. No wheezing or rhonchi.  Lymphadenopathy:     Cervical: No cervical adenopathy.  Skin:    General: Skin is warm and dry.     Capillary Refill: Capillary refill takes less than 2 seconds.     Findings: No rash.     Comments: Small cutaneous wart on L index finger with some dried blood  Neurological:     Mental Status: She is  alert and oriented to person, place, and time.  Psychiatric:        Behavior: Behavior normal.         Assessment & Plan   1. Dysfunction of both eustachian tubes - new problem - related to allergic rhinitis - add OTC antihistamine and flonase to Singulair  - no evidence of AOM - discussed symptomatic management, natural course, and return precautions   2. Other viral warts - discussed soaking and scraping it down each nile with nail file, applying salicylic acid and covering with duct tape - could consider cryotherapy in the future   Also discussed COVID19 and discussed precautions to avoid contagious ID.   Return if symptoms worsen or fail to improve.   The entirety of the information documented in the History of Present Illness, Review of Systems and Physical Exam were personally obtained by me. Portions of this information were initially documented by [facility], CMA and reviewed by me for thoroughness and accuracy.    Virginia Crews, MD, MPH [facility] 03/02/2019 4:40 PM

## 2019-03-08 ENCOUNTER — Ambulatory Visit (INDEPENDENT_AMBULATORY_CARE_PROVIDER_SITE_OTHER): Payer: Federal, State, Local not specified - PPO | Admitting: Family Medicine

## 2019-03-08 ENCOUNTER — Encounter: Payer: Self-pay | Admitting: Family Medicine

## 2019-03-08 DIAGNOSIS — B9789 Other viral agents as the cause of diseases classified elsewhere: Secondary | ICD-10-CM | POA: Diagnosis not present

## 2019-03-08 DIAGNOSIS — J069 Acute upper respiratory infection, unspecified: Secondary | ICD-10-CM | POA: Diagnosis not present

## 2019-03-08 NOTE — Progress Notes (Signed)
Patient: Brenda Ross Female    DOB: Feb 06, 1976   43 y.o.   MRN: [id number] Visit Date: 03/08/2019  Today's Provider: Lavon Paganini, MD   Chief Complaint  Patient presents with  . URI   Subjective:    Virtual Visit via Video Note  I connected with Brenda Ross on 03/08/19 at  9:20 AM EDT by a video enabled telemedicine application and verified that I am speaking with the correct person using two identifiers.   I discussed the limitations of evaluation and management by telemedicine and the availability of in person appointments. The patient expressed understanding and agreed to proceed.   Patient location: home Provider location: [facility]   URI   Associated symptoms include coughing.   Started coughing (dry) about 4-5 days ago.  Also with sore throat.  Felt like it settled down into chest.  It is getting better within the last 24 hours.  Still feels congested at night time. Using airborne, honey and lemon Still feeling very drained and like she is sick Husband is working out of the home. She is worried that if she had COVID19 that he would pass it around [id number]. No fever, but did have a feeling that she warm No myalgias or SOB.  Allergies  Allergen Reactions  . Iodinated Diagnostic Agents Hives, Shortness Of Breath and Swelling  . Eggs Or Egg-Derived Products Diarrhea  . Gluten Meal Diarrhea    Celiac's disease  . Ross Diarrhea     Current Outpatient Medications:  .  CARTIA XT 120 MG 24 hr capsule, Take 1 capsule by mouth daily., Disp: , Rfl:  .  cetirizine (ZYRTEC) 10 MG tablet, Take 10 mg by mouth daily., Disp: , Rfl:  .  losartan (COZAAR) 100 MG tablet, Take 1 tablet by mouth daily., Disp: , Rfl:  .  montelukast (SINGULAIR) 10 MG tablet, Take 10 mg by mouth daily., Disp: , Rfl:  .  pantoprazole (PROTONIX) 40 MG tablet, Take 1 tablet by mouth 2 (two) times daily., Disp: , Rfl:  .  ranitidine (ZANTAC) 150 MG tablet, Take 1 tablet  by mouth 2 (two) times daily., Disp: , Rfl:  .  valACYclovir (VALTREX) 500 MG tablet, Take 1 tablet (500 mg total) by mouth 2 (two) times daily. As needed for future outbreaks, Disp: 6 tablet, Rfl: 2  Review of Systems  Constitutional: Negative.   Respiratory: Positive for cough.   Cardiovascular: Negative.     Social History   Tobacco Use  . Smoking status: Former Smoker    Packs/day: 1.00    Years: 3.00    Pack years: 3.00    Types: Cigarettes    Last attempt to quit: 01/14/1996    Years since quitting: 23.1  . Smokeless tobacco: Never Used  Substance Use Topics  . Alcohol use: Yes    Alcohol/week: 7.0 - 14.0 standard drinks    Types: 7 - 14 Glasses of wine per week    Frequency: Never      Objective:   There were no vitals taken for this visit. There were no vitals filed for this visit.  Able to count to 35 in one exhale without coughing  Physical Exam Constitutional:      General: She is not in acute distress.    Appearance: Normal appearance. She is not diaphoretic.  HENT:     Head: Normocephalic and atraumatic.     Mouth/Throat:     Mouth: Mucous membranes are moist.  Pharynx: Oropharynx is clear.  Eyes:     General: No scleral icterus.    Conjunctiva/sclera: Conjunctivae normal.  Pulmonary:     Effort: Pulmonary effort is normal. No respiratory distress.  Neurological:     Mental Status: She is alert and oriented to person, place, and time.  Psychiatric:        Mood and Affect: Mood normal.        Behavior: Behavior normal.         Assessment & Plan   1. Viral URI with cough - symptoms and exam c/w viral URI - no evidence of strep pharyngitis, CAP, AOM, bacterial sinusitis, or other bacterial infection - reassured that symptoms are not consistent with COVID19 infection as she has no fever, SOB, myalgias. - also reassuring that she is already getting better - discussed that mild cases of COVID19 are managed symptomatically - discussed that if  she develops SOB she should seek emergent medical care - advised on social distancing and isolation - discussed symptomatic management, natural course, and return precautions    Return if symptoms worsen or fail to improve.   The entirety of the information documented in the History of Present Illness, Review of Systems and Physical Exam were personally obtained by me. Portions of this information were initially documented by Brenda Ross, CMA and reviewed by me for thoroughness and accuracy.    Follow Up Instructions:  I discussed the assessment and treatment plan with the patient. The patient was provided an opportunity to ask questions and all were answered. The patient agreed with the plan and demonstrated an understanding of the instructions.   The patient was advised to call back or seek an in-person evaluation if the symptoms worsen or if the condition fails to improve as anticipated.  I provided 15 minutes of non-face-to-face time during this encounter.   Brenda Crews, MD, MPH [facility] 03/08/2019 9:53 AM

## 2019-03-08 NOTE — Patient Instructions (Signed)

## 2019-03-14 DIAGNOSIS — G4733 Obstructive sleep apnea (adult) (pediatric): Secondary | ICD-10-CM | POA: Diagnosis not present

## 2019-04-13 DIAGNOSIS — G4733 Obstructive sleep apnea (adult) (pediatric): Secondary | ICD-10-CM | POA: Diagnosis not present

## 2019-04-17 DIAGNOSIS — J3089 Other allergic rhinitis: Secondary | ICD-10-CM | POA: Diagnosis not present

## 2019-04-17 DIAGNOSIS — H1045 Other chronic allergic conjunctivitis: Secondary | ICD-10-CM | POA: Diagnosis not present

## 2019-04-17 DIAGNOSIS — L501 Idiopathic urticaria: Secondary | ICD-10-CM | POA: Diagnosis not present

## 2019-04-17 DIAGNOSIS — K219 Gastro-esophageal reflux disease without esophagitis: Secondary | ICD-10-CM | POA: Diagnosis not present

## 2019-05-14 DIAGNOSIS — G4733 Obstructive sleep apnea (adult) (pediatric): Secondary | ICD-10-CM | POA: Diagnosis not present

## 2019-05-28 ENCOUNTER — Telehealth: Payer: Self-pay

## 2019-05-28 NOTE — Progress Notes (Signed)
Patient: Brenda Ross Female    DOB: 11-26-1976   43 y.o.   MRN: [id number] Visit Date: 05/29/2019  Today's Provider: Mar Daring, PA-C   Chief Complaint  Patient presents with  . Herpes flare up   Subjective:     HPI  Patient here with c/o herpes flare up and possible yeast infection. Reports that is itching a lot. She reports she has been using monistat. Reports that the Monistat is helping. She also has been using Valtrex. She would like to be checked.  She reports she had been working outside in the yard one weekend and was sweating a lot. A couple days later she noticed the itching and irritation vaginally. She started using monistat. A day after starting the monistat she thought she felt her hair pulling. She tried to adjust but sensation continued. She went to the bathroom and checked and saw 2 small blisters c/w previous herpetic outbreak. She started her valtrex. The pulling pain she was feeling has improved but the lesions have not went away yet.   Allergies  Allergen Reactions  . Iodinated Diagnostic Agents Hives, Shortness Of Breath and Swelling  . Eggs Or Egg-Derived Products Diarrhea  . Gluten Meal Diarrhea    Celiac's disease  . Pea Diarrhea     Current Outpatient Medications:  .  CARTIA XT 120 MG 24 hr capsule, Take 1 capsule by mouth daily., Disp: , Rfl:  .  cetirizine (ZYRTEC) 10 MG tablet, Take 10 mg by mouth daily., Disp: , Rfl:  .  losartan (COZAAR) 100 MG tablet, Take 1 tablet by mouth daily., Disp: , Rfl:  .  montelukast (SINGULAIR) 10 MG tablet, Take 10 mg by mouth daily., Disp: , Rfl:  .  pantoprazole (PROTONIX) 40 MG tablet, Take 1 tablet by mouth 2 (two) times daily., Disp: , Rfl:  .  valACYclovir (VALTREX) 500 MG tablet, Take 1 tablet (500 mg total) by mouth 2 (two) times daily. As needed for future outbreaks, Disp: 6 tablet, Rfl: 2  Review of Systems  Constitutional: Negative for appetite change, chills, fatigue and fever.   Respiratory: Negative for chest tightness and shortness of breath.   Cardiovascular: Negative for chest pain and palpitations.  Gastrointestinal: Negative for abdominal pain, nausea and vomiting.  Genitourinary: Positive for genital sores and vaginal discharge.  Neurological: Negative for dizziness and weakness.    Social History   Tobacco Use  . Smoking status: Former Smoker    Packs/day: 1.00    Years: 3.00    Pack years: 3.00    Types: Cigarettes    Quit date: 01/14/1996    Years since quitting: 23.3  . Smokeless tobacco: Never Used  Substance Use Topics  . Alcohol use: Yes    Alcohol/week: 7.0 - 14.0 standard drinks    Types: 7 - 14 Glasses of wine per week    Frequency: Never      Objective:   BP 131/80 (BP Location: Left Arm, Patient Position: Sitting, Cuff Size: Large)   Pulse 70   Temp 98.3 F (36.8 C) (Oral)   Resp 16   Wt (!) 301 lb (136.5 kg)   BMI 51.67 kg/m  Vitals:   05/29/19 1524  BP: 131/80  Pulse: 70  Resp: 16  Temp: 98.3 F (36.8 C)  TempSrc: Oral  Weight: (!) 301 lb (136.5 kg)     Physical Exam Vitals signs reviewed.  Constitutional:      General: She is not in  acute distress.    Appearance: Normal appearance. She is well-developed. She is obese. She is not ill-appearing or diaphoretic.  Neck:     Musculoskeletal: Normal range of motion and neck supple.  Cardiovascular:     Rate and Rhythm: Normal rate and regular rhythm.     Heart sounds: Normal heart sounds. No murmur. No friction rub. No gallop.   Pulmonary:     Effort: Pulmonary effort is normal. No respiratory distress.     Breath sounds: Normal breath sounds. No wheezing or rales.  Abdominal:     Hernia: There is no hernia in the left inguinal area or right inguinal area.  Genitourinary:    Exam position: Supine.     Labia:        Right: No rash, tenderness, lesion or injury.        Left: Tenderness and lesion present. No rash or injury.      Vagina: Vaginal discharge (thick,  white (cottage cheese) discharge noted in labia minora folds) and erythema present.     Cervix: Normal.    Lymphadenopathy:     Lower Body: No right inguinal adenopathy. No left inguinal adenopathy.  Neurological:     Mental Status: She is alert.         Assessment & Plan    1. Yeast infection Signs of yeast infection present. Most likely from sweating in heat. Discussed washing and drying well. Keep area dry (cornstarch in folds). Use white cotton underwear.  Diflucan given as below. Call if not improving. - fluconazole (DIFLUCAN) 150 MG tablet; Take 1 tablet (150 mg total) by mouth once for 1 dose. May repeat in 48-72 hrs  Dispense: 2 tablet; Refill: 0  2. 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 Continue valtrex q 12 hrs until lesions subside. She voices understanding.      Mar Daring, PA-C  [facility]

## 2019-05-28 NOTE — Telephone Encounter (Signed)
Patient is requesting a call back regarding a herpes outbreak and possible yeast infection. She wants to know what medication she should be using. No available appointments today with a female provider. Patient scheduled for tomorrow. CB#[phone]

## 2019-05-28 NOTE — Telephone Encounter (Signed)
Valtrex for herpes.  Can use OTC monistat for yeast

## 2019-05-29 ENCOUNTER — Encounter: Payer: Self-pay | Admitting: Physician Assistant

## 2019-05-29 ENCOUNTER — Ambulatory Visit: Payer: Federal, State, Local not specified - PPO | Admitting: Physician Assistant

## 2019-05-29 ENCOUNTER — Other Ambulatory Visit: Payer: Self-pay

## 2019-05-29 VITALS — BP 131/80 | HR 70 | Temp 98.3°F | Resp 16 | Wt 301.0 lb

## 2019-05-29 DIAGNOSIS — B379 Candidiasis, unspecified: Secondary | ICD-10-CM | POA: Diagnosis not present

## 2019-05-29 DIAGNOSIS — B009 Herpesviral infection, unspecified: Secondary | ICD-10-CM

## 2019-05-29 MED ORDER — FLUCONAZOLE 150 MG PO TABS: 150.0000 mg | ORAL_TABLET | Freq: Once | ORAL | 0 refills | Status: AC

## 2019-05-31 ENCOUNTER — Encounter: Payer: Self-pay | Admitting: Physician Assistant

## 2019-06-13 DIAGNOSIS — G4733 Obstructive sleep apnea (adult) (pediatric): Secondary | ICD-10-CM | POA: Diagnosis not present

## 2019-06-22 ENCOUNTER — Telehealth: Payer: Self-pay | Admitting: Family Medicine

## 2019-06-22 NOTE — Telephone Encounter (Signed)
Pt wants an in-office visit for her sore throat.  I offered a virtual / doxy.me visit.  However, she feels she needs it to be looked at in person.  Her tonsil is swollen and has a couple of white patches on it.  She was offered to go to an urgent care over the weekend.  She refused and wants a call back on Monday for an in-office visit.  Please call pt back and advise.  Thanks, [employer]

## 2019-06-25 ENCOUNTER — Ambulatory Visit: Payer: Federal, State, Local not specified - PPO | Admitting: Physician Assistant

## 2019-06-25 ENCOUNTER — Encounter: Payer: Self-pay | Admitting: Physician Assistant

## 2019-06-25 ENCOUNTER — Other Ambulatory Visit: Payer: Self-pay

## 2019-06-25 VITALS — BP 132/76 | HR 57 | Temp 98.2°F | Resp 16 | Wt 298.9 lb

## 2019-06-25 DIAGNOSIS — J0140 Acute pansinusitis, unspecified: Secondary | ICD-10-CM | POA: Diagnosis not present

## 2019-06-25 DIAGNOSIS — M7989 Other specified soft tissue disorders: Secondary | ICD-10-CM | POA: Diagnosis not present

## 2019-06-25 DIAGNOSIS — H6981 Other specified disorders of Eustachian tube, right ear: Secondary | ICD-10-CM

## 2019-06-25 DIAGNOSIS — J029 Acute pharyngitis, unspecified: Secondary | ICD-10-CM

## 2019-06-25 DIAGNOSIS — R7309 Other abnormal glucose: Secondary | ICD-10-CM

## 2019-06-25 LAB — POCT RAPID STREP A (OFFICE): Rapid Strep A Screen: NEGATIVE

## 2019-06-25 MED ORDER — AZITHROMYCIN 250 MG PO TABS: ORAL_TABLET | ORAL | 0 refills | Status: DC

## 2019-06-25 NOTE — Progress Notes (Signed)
Patient: Brenda Ross Female    DOB: 07-01-1976   43 y.o.   MRN: [id number] Visit Date: 06/25/2019  Today's Provider: Mar Daring, PA-C   Chief Complaint  Patient presents with  . Sore Throat   Subjective:     Sore Throat  This is a new problem. The current episode started 1 to 4 weeks ago (almost 2 weeks). The problem has been unchanged. Neither side of throat is experiencing more pain than the other. There has been no fever. Associated symptoms include congestion, coughing ("occasional") and diarrhea ("only because she ate egg on Saturday and has been having diarrhea since then"). Pertinent negatives include no abdominal pain, ear pain, headaches, shortness of breath or trouble swallowing. She has had no exposure to strep. She has tried gargles for the symptoms. The treatment provided no relief.    Allergies  Allergen Reactions  . Iodinated Diagnostic Agents Hives, Shortness Of Breath and Swelling  . Eggs Or Egg-Derived Products Diarrhea  . Gluten Meal Diarrhea    Celiac's disease  . Pea Diarrhea     Current Outpatient Medications:  .  CARTIA XT 120 MG 24 hr capsule, Take 1 capsule by mouth daily., Disp: , Rfl:  .  cetirizine (ZYRTEC) 10 MG tablet, Take 10 mg by mouth daily., Disp: , Rfl:  .  FAMOTIDINE PO, Take by mouth., Disp: , Rfl:  .  losartan (COZAAR) 100 MG tablet, Take 1 tablet by mouth daily., Disp: , Rfl:  .  montelukast (SINGULAIR) 10 MG tablet, Take 10 mg by mouth daily., Disp: , Rfl:  .  pantoprazole (PROTONIX) 40 MG tablet, Take 1 tablet by mouth 2 (two) times daily., Disp: , Rfl:  .  valACYclovir (VALTREX) 500 MG tablet, Take 1 tablet (500 mg total) by mouth 2 (two) times daily. As needed for future outbreaks, Disp: 6 tablet, Rfl: 2  Review of Systems  Constitutional: Negative for fatigue and fever.  HENT: Positive for congestion, postnasal drip and sore throat. Negative for ear pain, rhinorrhea, sinus pressure, sinus pain and trouble  swallowing.   Respiratory: Positive for cough ("occasional"). Negative for chest tightness, shortness of breath and wheezing.   Cardiovascular: Negative.   Gastrointestinal: Positive for diarrhea ("only because she ate egg on Saturday and has been having diarrhea since then"). Negative for abdominal pain.  Neurological: Negative for dizziness and headaches.    Social History   Tobacco Use  . Smoking status: Former Smoker    Packs/day: 1.00    Years: 3.00    Pack years: 3.00    Types: Cigarettes    Quit date: 01/14/1996    Years since quitting: 23.4  . Smokeless tobacco: Never Used  Substance Use Topics  . Alcohol use: Yes    Alcohol/week: 7.0 - 14.0 standard drinks    Types: 7 - 14 Glasses of wine per week    Frequency: Never      Objective:   BP 132/76 (BP Location: Left Wrist, Patient Position: Sitting, Cuff Size: Normal)   Pulse (!) 57   Temp 98.2 F (36.8 C) (Oral)   Resp 16   Wt 298 lb 14.4 oz (135.6 kg)   BMI 51.31 kg/m  Vitals:   06/25/19 1623  BP: 132/76  Pulse: (!) 57  Resp: 16  Temp: 98.2 F (36.8 C)  TempSrc: Oral  Weight: 298 lb 14.4 oz (135.6 kg)     Physical Exam Vitals signs reviewed.  Constitutional:  General: She is not in acute distress.    Appearance: She is well-developed. She is obese. She is not ill-appearing or diaphoretic.  HENT:     Head: Normocephalic and atraumatic.     Right Ear: Hearing, ear canal and external ear normal. A middle ear effusion (clear) is present. Tympanic membrane is not erythematous.     Left Ear: Hearing, tympanic membrane, ear canal and external ear normal.     Nose: Congestion present. No rhinorrhea.     Right Sinus: Maxillary sinus tenderness and frontal sinus tenderness present.     Left Sinus: Maxillary sinus tenderness and frontal sinus tenderness present.     Mouth/Throat:     Mouth: Mucous membranes are moist.     Pharynx: Uvula midline. Posterior oropharyngeal erythema (cobblestoning) present. No  oropharyngeal exudate.     Tonsils: No tonsillar exudate or tonsillar abscesses.  Neck:     Musculoskeletal: Normal range of motion and neck supple.     Thyroid: No thyromegaly.     Trachea: No tracheal deviation.  Cardiovascular:     Rate and Rhythm: Normal rate and regular rhythm.     Heart sounds: Normal heart sounds. No murmur. No friction rub. No gallop.   Pulmonary:     Effort: Pulmonary effort is normal. No respiratory distress.     Breath sounds: Normal breath sounds. No stridor. No wheezing or rales.  Abdominal:     General: Bowel sounds are normal. There is no distension.     Palpations: Abdomen is soft.     Tenderness: There is no abdominal tenderness.  Musculoskeletal:     Right lower leg: No edema.     Left lower leg: No edema.  Lymphadenopathy:     Cervical: No cervical adenopathy.  Skin:    Capillary Refill: Capillary refill takes less than 2 seconds.  Neurological:     General: No focal deficit present.     Mental Status: She is alert.  Psychiatric:        Mood and Affect: Mood normal.        Behavior: Behavior normal.      Results for orders placed or performed in visit on 06/25/19  POCT rapid strep A  Result Value Ref Range   Rapid Strep A Screen Negative Negative       Assessment & Plan    1. Sore throat Recurrent. Strep negative today. Suspect secondary to post nasal drainage. Will refer to ENT at patient request for recurrent sinus issues.  - Ambulatory referral to ENT  2. Acute non-recurrent pansinusitis Worsening symptoms that have not responded to OTC medications. Will give azithromycin as below (patient request dude to allergies and intolerances; did advise of high risk of resistance and to call if not improving). Continue allergy medications. Stay well hydrated and get plenty of rest. Call if no symptom improvement or if symptoms worsen. - Ambulatory referral to ENT - azithromycin (ZITHROMAX) 250 MG tablet; Take 2 tablets PO on day one, and  one tablet PO daily thereafter until completed.  Dispense: 6 tablet; Refill: 0  3. Dysfunction of right eustachian tube Clear fluid with air bubbles noted. Advised to use Flonase OTC.   4. Swelling of extremity Patient reports. Not on exam today. Will check labs as below and f/u pending results.  - CBC w/Diff/Platelet - Comprehensive Metabolic Panel (CMET)  5. Elevated hemoglobin A1c Due for recheck of A1c.  - HgB A1c  Other orders - FAMOTIDINE PO; Take by mouth.  Mar Daring, PA-C  [facility]

## 2019-06-25 NOTE — Telephone Encounter (Signed)
We need to see if she has any other symptoms.  Sore throat could represent COVID19 infection and we are trying to keep this out of the office to protect our healthy patients.  If only the sore throat, can see if Adriana or Tawanna Sat would be willing to see her in person today.

## 2019-06-25 NOTE — Telephone Encounter (Signed)
Patient advised as directed below. 

## 2019-06-25 NOTE — Telephone Encounter (Signed)
Can we see how she is doing since this message was from Friday. If not improved I can see her at 4pm today.

## 2019-07-03 DIAGNOSIS — F331 Major depressive disorder, recurrent, moderate: Secondary | ICD-10-CM | POA: Diagnosis not present

## 2019-07-04 ENCOUNTER — Encounter: Payer: Self-pay | Admitting: Physician Assistant

## 2019-07-10 DIAGNOSIS — F331 Major depressive disorder, recurrent, moderate: Secondary | ICD-10-CM | POA: Diagnosis not present

## 2019-07-14 DIAGNOSIS — G4733 Obstructive sleep apnea (adult) (pediatric): Secondary | ICD-10-CM | POA: Diagnosis not present

## 2019-07-17 DIAGNOSIS — F331 Major depressive disorder, recurrent, moderate: Secondary | ICD-10-CM | POA: Diagnosis not present

## 2019-07-24 DIAGNOSIS — F331 Major depressive disorder, recurrent, moderate: Secondary | ICD-10-CM | POA: Diagnosis not present

## 2019-08-01 DIAGNOSIS — F331 Major depressive disorder, recurrent, moderate: Secondary | ICD-10-CM | POA: Diagnosis not present

## 2019-08-03 ENCOUNTER — Telehealth: Payer: Self-pay | Admitting: Family Medicine

## 2019-08-03 NOTE — Telephone Encounter (Signed)
Pt needing to discuss her water retention.  She's been drinking a lot of water and not going to the bathroom enough.  Please advise.  Thanks, [employer]

## 2019-08-03 NOTE — Telephone Encounter (Signed)
Patient requesting advise to what she can do in the mean time. Patient reports that she will try to come in Monday to have labs done.

## 2019-08-03 NOTE — Telephone Encounter (Signed)
I would advise her to come in and get that lab work done.  Brenda Ross had ordered that to work-up her swelling.  That will give [place] a better idea about what is causing this.

## 2019-08-03 NOTE — Telephone Encounter (Signed)
Patient states that she is dealing with water retention and is gaining weight as well as swelling in her hand. Patient states that she is on a diet. Patient states that she scared something maybe wrong with kidney.Patient is going try to come get labs done next week that [place] had order. Please advise.

## 2019-08-06 DIAGNOSIS — R7309 Other abnormal glucose: Secondary | ICD-10-CM | POA: Diagnosis not present

## 2019-08-06 DIAGNOSIS — M7989 Other specified soft tissue disorders: Secondary | ICD-10-CM | POA: Diagnosis not present

## 2019-08-06 NOTE — Telephone Encounter (Signed)
I can't advised on treatment if we have not finished eval for cause yet.  Once labs result, will be able to give more info.  Elevation and compression are good starting places though.

## 2019-08-06 NOTE — Telephone Encounter (Signed)
Patient was advised.  

## 2019-08-07 ENCOUNTER — Telehealth: Payer: Self-pay | Admitting: Family Medicine

## 2019-08-07 DIAGNOSIS — F331 Major depressive disorder, recurrent, moderate: Secondary | ICD-10-CM | POA: Diagnosis not present

## 2019-08-07 LAB — CBC WITH DIFFERENTIAL/PLATELET
Basophils Absolute: 0 10*3/uL (ref 0.0–0.2)
Basos: 1 %
EOS (ABSOLUTE): 0.3 10*3/uL (ref 0.0–0.4)
Eos: 4 %
Hematocrit: 36.4 % (ref 34.0–46.6)
Hemoglobin: 11 g/dL — ABNORMAL LOW (ref 11.1–15.9)
Immature Grans (Abs): 0 10*3/uL (ref 0.0–0.1)
Immature Granulocytes: 0 %
Lymphocytes Absolute: 2.8 10*3/uL (ref 0.7–3.1)
Lymphs: 35 %
MCH: 25.3 pg — ABNORMAL LOW (ref 26.6–33.0)
MCHC: 30.2 g/dL — ABNORMAL LOW (ref 31.5–35.7)
MCV: 84 fL (ref 79–97)
Monocytes Absolute: 0.4 10*3/uL (ref 0.1–0.9)
Monocytes: 5 %
Neutrophils Absolute: 4.5 10*3/uL (ref 1.4–7.0)
Neutrophils: 55 %
Platelets: 278 10*3/uL (ref 150–450)
RBC: 4.34 x10E6/uL (ref 3.77–5.28)
RDW: 14.5 % (ref 11.7–15.4)
WBC: 8.1 10*3/uL (ref 3.4–10.8)

## 2019-08-07 LAB — COMPREHENSIVE METABOLIC PANEL
ALT: 22 IU/L (ref 0–32)
AST: 14 IU/L (ref 0–40)
Albumin/Globulin Ratio: 1.7 (ref 1.2–2.2)
Albumin: 4.2 g/dL (ref 3.8–4.8)
Alkaline Phosphatase: 74 IU/L (ref 39–117)
BUN/Creatinine Ratio: 14 (ref 9–23)
BUN: 11 mg/dL (ref 6–24)
Bilirubin Total: 0.3 mg/dL (ref 0.0–1.2)
CO2: 21 mmol/L (ref 20–29)
Calcium: 9.2 mg/dL (ref 8.7–10.2)
Chloride: 104 mmol/L (ref 96–106)
Creatinine, Ser: 0.8 mg/dL (ref 0.57–1.00)
GFR calc Af Amer: 104 mL/min/{1.73_m2} (ref >59)
GFR calc non Af Amer: 90 mL/min/{1.73_m2} (ref >59)
Globulin, Total: 2.5 g/dL (ref 1.5–4.5)
Glucose: 105 mg/dL — ABNORMAL HIGH (ref 65–99)
Potassium: 4.2 mmol/L (ref 3.5–5.2)
Sodium: 141 mmol/L (ref 134–144)
Total Protein: 6.7 g/dL (ref 6.0–8.5)

## 2019-08-07 LAB — HEMOGLOBIN A1C
Est. average glucose Bld gHb Est-mCnc: 120 mg/dL
Hgb A1c MFr Bld: 5.8 % — ABNORMAL HIGH (ref 4.8–5.6)

## 2019-08-07 NOTE — Telephone Encounter (Signed)
Pt called waiting for lab results  Brenda Ross

## 2019-08-08 ENCOUNTER — Telehealth: Payer: Self-pay

## 2019-08-08 NOTE — Telephone Encounter (Signed)
She can be scheduled for in person visit with me if she wants.

## 2019-08-08 NOTE — Telephone Encounter (Signed)
-----   Message from Mar Daring, [place] sent at 08/08/2019  8:57 AM EDT ----- Hemoglobin stable and slightly improved to 11. Rest of blood count normal. Kidney and liver function normal. Liver enzymes back to normal range. Sodium, potassium and calcium are normal. A1c increased just slightly to 5.8 from 5.7.

## 2019-08-08 NOTE — Telephone Encounter (Signed)
LMTCB to schedule a follow up appointment with Dr. Brita Romp.

## 2019-08-08 NOTE — Telephone Encounter (Signed)
Patient called back and requested a virtual Visit for tomorrow.

## 2019-08-08 NOTE — Telephone Encounter (Signed)
See phone note below. Wants "doctor" to evaluate.[name] KitchenMarland KitchenMarland Kitchen

## 2019-08-08 NOTE — Telephone Encounter (Signed)
Patient advised as directed below. Patient Ok so why are my feet swelling and that she needs a doctor to tell her what's wrong with her and not someone just to tell her "oh ok you need to follow a low salt diet and elevate your feet" she needs the doctor investigate more about what is wrong with her because she is only 43 years old and this shouldn't be happening to her. She prefers a doctor and would like to have an in office visit.

## 2019-08-09 ENCOUNTER — Encounter: Payer: Self-pay | Admitting: Family Medicine

## 2019-08-09 ENCOUNTER — Encounter: Payer: Federal, State, Local not specified - PPO | Admitting: Family Medicine

## 2019-08-09 NOTE — Progress Notes (Signed)
     Patient not available online at her appt time or for 20 afterwards. Unable to reach her by phone. No show

## 2019-08-10 DIAGNOSIS — J342 Deviated nasal septum: Secondary | ICD-10-CM | POA: Diagnosis not present

## 2019-08-10 DIAGNOSIS — J301 Allergic rhinitis due to pollen: Secondary | ICD-10-CM | POA: Diagnosis not present

## 2019-08-16 DIAGNOSIS — R600 Localized edema: Secondary | ICD-10-CM | POA: Insufficient documentation

## 2019-08-17 DIAGNOSIS — R002 Palpitations: Secondary | ICD-10-CM | POA: Diagnosis not present

## 2019-08-17 DIAGNOSIS — I10 Essential (primary) hypertension: Secondary | ICD-10-CM | POA: Diagnosis not present

## 2019-08-17 DIAGNOSIS — R600 Localized edema: Secondary | ICD-10-CM | POA: Diagnosis not present

## 2019-08-21 DIAGNOSIS — F331 Major depressive disorder, recurrent, moderate: Secondary | ICD-10-CM | POA: Diagnosis not present

## 2019-08-24 DIAGNOSIS — R600 Localized edema: Secondary | ICD-10-CM | POA: Diagnosis not present

## 2019-08-28 DIAGNOSIS — F331 Major depressive disorder, recurrent, moderate: Secondary | ICD-10-CM | POA: Diagnosis not present

## 2019-08-29 DIAGNOSIS — G4733 Obstructive sleep apnea (adult) (pediatric): Secondary | ICD-10-CM | POA: Diagnosis not present

## 2019-09-04 DIAGNOSIS — F331 Major depressive disorder, recurrent, moderate: Secondary | ICD-10-CM | POA: Diagnosis not present

## 2019-09-11 DIAGNOSIS — F331 Major depressive disorder, recurrent, moderate: Secondary | ICD-10-CM | POA: Diagnosis not present

## 2019-09-13 DIAGNOSIS — G4733 Obstructive sleep apnea (adult) (pediatric): Secondary | ICD-10-CM | POA: Diagnosis not present

## 2019-09-14 DIAGNOSIS — R002 Palpitations: Secondary | ICD-10-CM | POA: Diagnosis not present

## 2019-09-14 DIAGNOSIS — I10 Essential (primary) hypertension: Secondary | ICD-10-CM | POA: Diagnosis not present

## 2019-09-18 DIAGNOSIS — G4733 Obstructive sleep apnea (adult) (pediatric): Secondary | ICD-10-CM | POA: Diagnosis not present

## 2019-09-26 DIAGNOSIS — F331 Major depressive disorder, recurrent, moderate: Secondary | ICD-10-CM | POA: Diagnosis not present

## 2019-10-05 DIAGNOSIS — F331 Major depressive disorder, recurrent, moderate: Secondary | ICD-10-CM | POA: Diagnosis not present

## 2019-10-09 DIAGNOSIS — F331 Major depressive disorder, recurrent, moderate: Secondary | ICD-10-CM | POA: Diagnosis not present

## 2019-10-14 DIAGNOSIS — G4733 Obstructive sleep apnea (adult) (pediatric): Secondary | ICD-10-CM | POA: Diagnosis not present

## 2019-10-16 DIAGNOSIS — F331 Major depressive disorder, recurrent, moderate: Secondary | ICD-10-CM | POA: Diagnosis not present

## 2019-10-23 DIAGNOSIS — F331 Major depressive disorder, recurrent, moderate: Secondary | ICD-10-CM | POA: Diagnosis not present

## 2019-11-02 DIAGNOSIS — F331 Major depressive disorder, recurrent, moderate: Secondary | ICD-10-CM | POA: Diagnosis not present

## 2019-11-06 DIAGNOSIS — F331 Major depressive disorder, recurrent, moderate: Secondary | ICD-10-CM | POA: Diagnosis not present

## 2019-11-07 ENCOUNTER — Ambulatory Visit: Payer: Federal, State, Local not specified - PPO | Admitting: Physician Assistant

## 2019-11-07 ENCOUNTER — Other Ambulatory Visit (HOSPITAL_COMMUNITY)
Admission: RE | Admit: 2019-11-07 | Discharge: 2019-11-07 | Disposition: A | Discharge status: Home / Self Care | Payer: Federal, State, Local not specified - PPO | Source: Ambulatory Visit | Attending: Physician Assistant | Admitting: Physician Assistant

## 2019-11-07 ENCOUNTER — Encounter: Payer: Self-pay | Admitting: Physician Assistant

## 2019-11-07 ENCOUNTER — Other Ambulatory Visit: Payer: Self-pay

## 2019-11-07 ENCOUNTER — Encounter: Payer: Self-pay | Admitting: Family Medicine

## 2019-11-07 VITALS — BP 132/82 | HR 80 | Temp 97.1°F | Wt 293.2 lb

## 2019-11-07 DIAGNOSIS — B379 Candidiasis, unspecified: Secondary | ICD-10-CM | POA: Diagnosis not present

## 2019-11-07 DIAGNOSIS — N898 Other specified noninflammatory disorders of vagina: Secondary | ICD-10-CM | POA: Insufficient documentation

## 2019-11-07 DIAGNOSIS — B351 Tinea unguium: Secondary | ICD-10-CM | POA: Diagnosis not present

## 2019-11-07 MED ORDER — FLUCONAZOLE 150 MG PO TABS: ORAL_TABLET | ORAL | 0 refills | Status: DC

## 2019-11-07 NOTE — Progress Notes (Signed)
Patient: Brenda Ross Female    DOB: January 14, 1976   43 y.o.   MRN: [id number] Visit Date: 11/07/2019  Today's Provider: Trinna Post, PA-C   Chief Complaint  Patient presents with  . Vaginal Discharge   Subjective:     Vaginal Discharge The patient's primary symptoms include genital itching, a genital odor and vaginal discharge. The current episode started 1 to 4 weeks ago. The problem has been unchanged. The patient is experiencing no pain. Pertinent negatives include no constipation, diarrhea, dysuria, frequency, rash or urgency. The vaginal discharge was white and thick. There has been no bleeding. She has tried antifungals for the symptoms. The treatment provided mild relief.    Allergies  Allergen Reactions  . Iodinated Diagnostic Agents Hives, Shortness Of Breath and Swelling  . Eggs Or Egg-Derived Products Diarrhea  . Gluten Meal Diarrhea    Celiac's disease  . Pea Diarrhea     Current Outpatient Medications:  .  CARTIA XT 120 MG 24 hr capsule, Take 1 capsule by mouth daily., Disp: , Rfl:  .  cetirizine (ZYRTEC) 10 MG tablet, Take 10 mg by mouth daily., Disp: , Rfl:  .  diltiazem (TIAZAC) 120 MG 24 hr capsule, Take 120 mg by mouth daily., Disp: , Rfl:  .  famotidine (PEPCID) 40 MG tablet, , Disp: , Rfl:  .  furosemide (LASIX) 20 MG tablet, Take 20 mg by mouth daily., Disp: , Rfl:  .  losartan (COZAAR) 100 MG tablet, Take 1 tablet by mouth daily., Disp: , Rfl:  .  montelukast (SINGULAIR) 10 MG tablet, Take 10 mg by mouth daily., Disp: , Rfl:  .  pantoprazole (PROTONIX) 40 MG tablet, Take 1 tablet by mouth 2 (two) times daily., Disp: , Rfl:  .  valACYclovir (VALTREX) 500 MG tablet, Take 1 tablet (500 mg total) by mouth 2 (two) times daily. As needed for future outbreaks, Disp: 6 tablet, Rfl: 2  Review of Systems  Gastrointestinal: Negative for constipation and diarrhea.  Genitourinary: Positive for vaginal discharge. Negative for dysuria, frequency and  urgency.  Skin: Negative for rash.    Social History   Tobacco Use  . Smoking status: Former Smoker    Packs/day: 1.00    Years: 3.00    Pack years: 3.00    Types: Cigarettes    Quit date: 01/14/1996    Years since quitting: 23.8  . Smokeless tobacco: Never Used  Substance Use Topics  . Alcohol use: Yes    Alcohol/week: 7.0 - 14.0 standard drinks    Types: 7 - 14 Glasses of wine per week    Frequency: Never      Objective:   BP 132/82 (BP Location: Left Arm, Patient Position: Sitting, Cuff Size: Large)   Pulse 80   Temp (!) 97.1 F (36.2 C) (Temporal)   Wt 293 lb 3.2 oz (133 kg)   LMP 10/17/2019   BMI 50.33 kg/m  Vitals:   11/07/19 1618  BP: 132/82  Pulse: 80  Temp: (!) 97.1 F (36.2 C)  TempSrc: Temporal  Weight: 293 lb 3.2 oz (133 kg)  Body mass index is 50.33 kg/m.   Physical Exam Exam conducted with a chaperone present.  Constitutional:      Appearance: Normal appearance.  Genitourinary:    Exam position: Lithotomy position.     Vagina: Vaginal discharge present.  Skin:    General: Skin is warm and dry.  Neurological:     Mental Status: She  is alert and oriented to person, place, and time. Mental status is at baseline.  Psychiatric:        Mood and Affect: Mood normal.        Behavior: Behavior normal.      No results found for any visits on 11/07/19.     Assessment & Plan    1. Yeast infection  Will treat empirically for yeast as below. Follow up pending vaginal swab.   - Cervicovaginal ancillary only - fluconazole (DIFLUCAN) 150 MG tablet; Take 1 pill on day 1 and 1 pill 3 days later if still symptomatic.  Dispense: 2 tablet; Refill: 0  2. Onychomycosis  - Ambulatory referral to Podiatry  The entirety of the information documented in the History of Present Illness, Review of Systems and Physical Exam were personally obtained by me. Portions of this information were initially documented by [facility], CMA and reviewed by me for  thoroughness and accuracy.         Trinna Post, PA-C  [facility]

## 2019-11-13 DIAGNOSIS — F331 Major depressive disorder, recurrent, moderate: Secondary | ICD-10-CM | POA: Diagnosis not present

## 2019-11-13 DIAGNOSIS — G4733 Obstructive sleep apnea (adult) (pediatric): Secondary | ICD-10-CM | POA: Diagnosis not present

## 2019-11-13 LAB — CERVICOVAGINAL ANCILLARY ONLY
Bacterial Vaginitis (gardnerella): NEGATIVE
Candida Glabrata: NEGATIVE
Candida Vaginitis: NEGATIVE
Chlamydia: NEGATIVE
Comment: NEGATIVE
Comment: NEGATIVE
Comment: NEGATIVE
Comment: NEGATIVE
Comment: NEGATIVE
Comment: NORMAL
Neisseria Gonorrhea: NEGATIVE
Trichomonas: NEGATIVE

## 2019-11-20 DIAGNOSIS — F331 Major depressive disorder, recurrent, moderate: Secondary | ICD-10-CM | POA: Diagnosis not present

## 2019-11-23 ENCOUNTER — Telehealth: Payer: Self-pay | Admitting: Family Medicine

## 2019-11-23 DIAGNOSIS — B351 Tinea unguium: Secondary | ICD-10-CM

## 2019-11-23 MED ORDER — CICLOPIROX 8 % EX KIT: PACK | CUTANEOUS | 0 refills | Status: DC

## 2019-11-23 NOTE — Telephone Encounter (Signed)
Yes, however  It is unlikely to work. You have to use it daily for 8 months and even then it has a very low chance of working.

## 2019-11-23 NOTE — Telephone Encounter (Signed)
Will forward to [place] who saw her for this problem.

## 2019-11-23 NOTE — Telephone Encounter (Signed)
Patient inquired if Dr. Brita Romp would be willing to send RX Ciclopirox topical solution until she is able to get an appointment with podiatry.

## 2019-11-27 DIAGNOSIS — F331 Major depressive disorder, recurrent, moderate: Secondary | ICD-10-CM | POA: Diagnosis not present

## 2019-12-04 DIAGNOSIS — F331 Major depressive disorder, recurrent, moderate: Secondary | ICD-10-CM | POA: Diagnosis not present

## 2019-12-21 ENCOUNTER — Encounter: Payer: Self-pay | Admitting: Podiatry

## 2019-12-21 ENCOUNTER — Ambulatory Visit: Payer: Federal, State, Local not specified - PPO | Admitting: Podiatry

## 2019-12-21 ENCOUNTER — Other Ambulatory Visit: Payer: Self-pay

## 2019-12-21 DIAGNOSIS — Z79899 Other long term (current) drug therapy: Secondary | ICD-10-CM | POA: Diagnosis not present

## 2019-12-21 DIAGNOSIS — B353 Tinea pedis: Secondary | ICD-10-CM

## 2019-12-21 DIAGNOSIS — B351 Tinea unguium: Secondary | ICD-10-CM

## 2019-12-21 MED ORDER — TERBINAFINE HCL 250 MG PO TABS: 250.0000 mg | ORAL_TABLET | Freq: Every day | ORAL | 2 refills | Status: DC

## 2019-12-21 MED ORDER — KETOCONAZOLE 2 % EX CREA: 1.0000 "application " | TOPICAL_CREAM | Freq: Every day | CUTANEOUS | 2 refills | Status: DC

## 2019-12-21 NOTE — Patient Instructions (Signed)
For the skin:  Mix the Revitaderm with the prescribed Ketoconazole cream daily  For the nails:  Tolcylen topical daily Also an appt monthly for laser nail treatment

## 2019-12-22 LAB — CBC WITH DIFFERENTIAL/PLATELET
Basophils Absolute: 0.1 10*3/uL (ref 0.0–0.2)
Basos: 1 %
EOS (ABSOLUTE): 0.3 10*3/uL (ref 0.0–0.4)
Eos: 3 %
Hematocrit: 36.9 % (ref 34.0–46.6)
Hemoglobin: 11.7 g/dL (ref 11.1–15.9)
Immature Grans (Abs): 0 10*3/uL (ref 0.0–0.1)
Immature Granulocytes: 0 %
Lymphocytes Absolute: 2.6 10*3/uL (ref 0.7–3.1)
Lymphs: 27 %
MCH: 26.4 pg — ABNORMAL LOW (ref 26.6–33.0)
MCHC: 31.7 g/dL (ref 31.5–35.7)
MCV: 83 fL (ref 79–97)
Monocytes Absolute: 0.5 10*3/uL (ref 0.1–0.9)
Monocytes: 5 %
Neutrophils Absolute: 6.2 10*3/uL (ref 1.4–7.0)
Neutrophils: 64 %
Platelets: 262 10*3/uL (ref 150–450)
RBC: 4.43 x10E6/uL (ref 3.77–5.28)
RDW: 14.1 % (ref 11.7–15.4)
WBC: 9.6 10*3/uL (ref 3.4–10.8)

## 2019-12-22 LAB — HEPATIC FUNCTION PANEL
ALT: 24 IU/L (ref 0–32)
AST: 17 IU/L (ref 0–40)
Albumin: 4.4 g/dL (ref 3.8–4.8)
Alkaline Phosphatase: 81 IU/L (ref 39–117)
Bilirubin Total: 0.5 mg/dL (ref 0.0–1.2)
Bilirubin, Direct: 0.15 mg/dL (ref 0.00–0.40)
Total Protein: 7 g/dL (ref 6.0–8.5)

## 2019-12-25 DIAGNOSIS — F331 Major depressive disorder, recurrent, moderate: Secondary | ICD-10-CM | POA: Diagnosis not present

## 2019-12-25 NOTE — Progress Notes (Signed)
   Subjective: 44 y.o. female presenting today as a new patient with a chief complaint of possible nail fungus of toes 1-5 bilaterally that has been present for the past several years. She reports some intermittent associated pain. She also complains of nonpainful dry skin to the plantar aspects of the bilateral feet. She reports some associated itching of the skin. She has been using OTC fungal creams for treatment with no significant relief. She denies modifying factors. Patient is here for further evaluation and treatment.   Past Medical History:  Diagnosis Date  . Allergic rhinitis   . Anemia   . Anxiety   . GERD (gastroesophageal reflux disease)   . Herpes genitalia   . Prediabetes   . Sleep apnea     Objective: Physical Exam General: The patient is alert and oriented x3 in no acute distress.  Dermatology: Hyperkeratotic, discolored, thickened, onychodystrophy noted to nails 1-5 bilaterally. Skin is warm, dry and supple bilateral lower extremities. Negative for open lesions or macerations. Pruritus noted to the bilateral feet with hyperkeratosis.  Vascular: Palpable pedal pulses bilaterally. No edema or erythema noted. Capillary refill within normal limits.  Neurological: Epicritic and protective threshold grossly intact bilaterally.   Musculoskeletal Exam: Range of motion within normal limits to all pedal and ankle joints bilateral. Muscle strength 5/5 in all groups bilateral.   Assessment: #1 Onychomycosis digits 1-5 bilaterally  #2 Hyperkeratotic nails digits 1-5 bilaterally  #3 tinea pedis bilateral   Plan of Care:  #1 Patient was evaluated. #2 OTC Tolcylen topical solution provided to patient.  #3 Urea 40% cream provided to patient.  #4 Prescription for Ketoconazole cream provided to patient.  #5 LFT ordered.  #6 Prescription for Lamisil 250 mg #90 provided to patient.  #7 Return to clinic as needed.     Edrick Kins, DPM [facility]  Dr.  Edrick Kins, [address]                                        [place], [place] [address]                Office [phone]  Fax [phone]

## 2020-01-01 DIAGNOSIS — F331 Major depressive disorder, recurrent, moderate: Secondary | ICD-10-CM | POA: Diagnosis not present

## 2020-01-04 ENCOUNTER — Other Ambulatory Visit: Payer: Federal, State, Local not specified - PPO

## 2020-01-08 DIAGNOSIS — F331 Major depressive disorder, recurrent, moderate: Secondary | ICD-10-CM | POA: Diagnosis not present

## 2020-01-14 DIAGNOSIS — N921 Excessive and frequent menstruation with irregular cycle: Secondary | ICD-10-CM | POA: Diagnosis not present

## 2020-01-15 DIAGNOSIS — F331 Major depressive disorder, recurrent, moderate: Secondary | ICD-10-CM | POA: Diagnosis not present

## 2020-01-25 ENCOUNTER — Other Ambulatory Visit: Payer: Federal, State, Local not specified - PPO

## 2020-01-30 DIAGNOSIS — F331 Major depressive disorder, recurrent, moderate: Secondary | ICD-10-CM | POA: Diagnosis not present

## 2020-02-05 DIAGNOSIS — F331 Major depressive disorder, recurrent, moderate: Secondary | ICD-10-CM | POA: Diagnosis not present

## 2020-02-06 ENCOUNTER — Telehealth: Payer: Self-pay

## 2020-02-06 NOTE — Telephone Encounter (Signed)
Patient states she is in need of CPAP supplies.  She did not have time to talk to me when I called her back.  She did say to do a generic order and just say, CPAP supplies.  She did not say where she gets her supplies or anything else.  I did check and she had sleep study on 04/2018 and 06/2018-reports are in care everywhere.   Copied from [place] [id number]. Topic: General - Call Back - No Documentation >> Feb 06, 2020 10:55 AM Erick Blinks wrote: Reason for CRM: Pt is requesting a call back regarding CPAP supplies, please advise Best contact: [phone]

## 2020-02-08 DIAGNOSIS — G4733 Obstructive sleep apnea (adult) (pediatric): Secondary | ICD-10-CM | POA: Diagnosis not present

## 2020-02-08 NOTE — Telephone Encounter (Signed)
Patient states she needs it sent to  [facility].  Patient states she has been without supply for awhile now. Patient is very upset regarding issues.  Let patient know what message below said, but patient states office should know where to send it.   [facility]: [place] [phone]

## 2020-02-11 NOTE — Telephone Encounter (Signed)
Faxed orders to [facility].

## 2020-02-11 NOTE — Telephone Encounter (Signed)
Patient advised as below.  

## 2020-02-11 NOTE — Telephone Encounter (Signed)
Form with Rx received from [facility].  Was completed and will be faxed back today

## 2020-02-12 DIAGNOSIS — F331 Major depressive disorder, recurrent, moderate: Secondary | ICD-10-CM | POA: Diagnosis not present

## 2020-02-13 ENCOUNTER — Ambulatory Visit (INDEPENDENT_AMBULATORY_CARE_PROVIDER_SITE_OTHER): Payer: Federal, State, Local not specified - PPO | Admitting: Family Medicine

## 2020-02-13 ENCOUNTER — Encounter: Payer: Self-pay | Admitting: Family Medicine

## 2020-02-13 DIAGNOSIS — Z711 Person with feared health complaint in whom no diagnosis is made: Secondary | ICD-10-CM | POA: Diagnosis not present

## 2020-02-13 DIAGNOSIS — N921 Excessive and frequent menstruation with irregular cycle: Secondary | ICD-10-CM | POA: Diagnosis not present

## 2020-02-13 DIAGNOSIS — Z7189 Other specified counseling: Secondary | ICD-10-CM

## 2020-02-13 DIAGNOSIS — Z7185 Encounter for immunization safety counseling: Secondary | ICD-10-CM

## 2020-02-13 DIAGNOSIS — B351 Tinea unguium: Secondary | ICD-10-CM

## 2020-02-13 DIAGNOSIS — R4789 Other speech disturbances: Secondary | ICD-10-CM

## 2020-02-13 DIAGNOSIS — H539 Unspecified visual disturbance: Secondary | ICD-10-CM

## 2020-02-13 MED ORDER — CICLOPIROX 8 % EX KIT: PACK | CUTANEOUS | 0 refills | Status: DC

## 2020-02-13 NOTE — Progress Notes (Signed)
Patient: Brenda Ross Female    DOB: 29-Apr-1976   44 y.o.   MRN: [id number] Visit Date: 02/13/2020  Today's Provider: Shirlee Latch, MD   Chief Complaint  Patient presents with  . Follow-up   Subjective:    I Brenda Ross, CMA, am acting as scribe for Brenda Latch, MD. Virtual Visit via Video Note  I connected with Brenda Ross on 02/13/20 at  3:40 PM EST by a video enabled telemedicine application and verified that I am speaking with the correct person using two identifiers.  Location: Patient location: home Provider location: [facility] Persons involved in the visit: patient, provider   I discussed the limitations of evaluation and management by telemedicine and the availability of in person appointments. The patient expressed understanding and agreed to proceed.  HPI Patient wants to go over several things. Like the COVID 19 vaccine, go over labs, about starting Lamisil prescribed by Dr. Logan Ross, Brenda Ross DPM. Patient also requesting a letter of medical necessity for weight loss program.  She is uneasy about getting the COVID19 vaccine.  She would like to see longer term side effects in the population before she takes it.  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  She is using CPAP with good compliance every night.  Rx was sent earlier this week  Saw podiatry and started on Lamisil PO for 3 months.  Considering laser treatment.   LFTs normal  Wanting to see Endocrinology.  She is followed by GYN for menstrual issues.  She has IUD for menorrhagia and now taking Loloestrin to reset cycle as well.  Feels like she has hormonal issues, despite normal TSH.  Feels as though thyroid is abnormal despite normal TSH.  She is wanting to see Dr Brenda Ross.  Wants to see eye doctor for concerns of vision changes  Needs form from MD to state medical necessity of weight loss program  Reports intermittent slurring of words.   Occasional word finding difficulty.  Ongoing issue for ~1 yr.  Feels much different from baseline.  Feels like she cannot string a sentence together at times.  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  Sleep is decreased.  Allergies  Allergen Reactions  . Iodinated Diagnostic Agents Hives, Shortness Of Breath and Swelling  . Eggs Or Egg-Derived Products Diarrhea  . Gluten Meal Diarrhea    Celiac's disease  . Pea Diarrhea     Current Outpatient Medications:  .  cetirizine (ZYRTEC) 10 MG tablet, Take 10 mg by mouth daily., Disp: , Rfl:  .  diltiazem (TIAZAC) 120 MG 24 hr capsule, Take 120 mg by mouth daily., Disp: , Rfl:  .  famotidine (PEPCID) 40 MG tablet, , Disp: , Rfl:  .  ketoconazole (NIZORAL) 2 % cream, Apply 1 application topically daily., Disp: 60 g, Rfl: 2 .  losartan (COZAAR) 100 MG tablet, Take 1 tablet by mouth daily., Disp: , Rfl:  .  montelukast (SINGULAIR) 10 MG tablet, Take 10 mg by mouth daily., Disp: , Rfl:  .  pantoprazole (PROTONIX) 40 MG tablet, Take 1 tablet by mouth 2 (two) times daily., Disp: , Rfl:  .  valACYclovir (VALTREX) 500 MG tablet, Take 1 tablet (500 mg total) by mouth 2 (two) times daily. As needed for future outbreaks, Disp: 6 tablet, Rfl: 2 .  Ciclopirox 8 % KIT, Apply one layer daily. (Patient not taking:  Reported on 02/13/2020), Disp: 34.6 mL, Rfl: 0 .  fluconazole (DIFLUCAN) 150 MG tablet, Take 1 pill on day 1 and 1 pill 3 days later if still symptomatic. (Patient not taking: Reported on 02/13/2020), Disp: 2 tablet, Rfl: 0 .  terbinafine (LAMISIL) 250 MG tablet, Take 1 tablet (250 mg total) by mouth daily. (Patient not taking: Reported on 02/13/2020), Disp: 30 tablet, Rfl: 2  Review of Systems  Constitutional: Negative.   Respiratory: Negative.   Cardiovascular: Negative.   Gastrointestinal: Negative.   Neurological: Positive for speech difficulty.  Psychiatric/Behavioral: Positive for decreased  concentration.    Social History   Tobacco Use  . Smoking status: Former Smoker    Packs/day: 1.00    Years: 3.00    Pack years: 3.00    Types: Cigarettes    Quit date: 01/14/1996    Years since quitting: 24.0  . Smokeless tobacco: Never Used  Substance Use Topics  . Alcohol use: Yes    Alcohol/week: 7.0 - 14.0 standard drinks    Types: 7 - 14 Glasses of wine per week      Objective:   There were no vitals taken for this visit. There were no vitals filed for this visit.There is no height or weight on file to calculate BMI.   Physical Exam Constitutional:      Appearance: Normal appearance.  Pulmonary:     Effort: Pulmonary effort is normal. No respiratory distress.  Neurological:     Mental Status: She is alert and oriented to person, place, and time. Mental status is at baseline.  Psychiatric:        Mood and Affect: Mood normal.        Behavior: Behavior normal.      No results found for any visits on 02/13/20.     Assessment & Plan    I discussed the assessment and treatment plan with the patient. The patient was provided an opportunity to ask questions and all were answered. The patient agreed with the plan and demonstrated an understanding of the instructions.   The patient was advised to call back or seek an in-person evaluation if the symptoms worsen or if the condition fails to improve as anticipated.  Total time spent on today's visit was greater than 40 minutes, including both face-to-face time and nonface-to-face time personally spent on review of chart (labs and imaging), discussing labs and goals, discussing further work-up, treatment options, referrals to specialist if needed, reviewing outside records of pertinent, answering patient's questions, and coordinating care.   1. Vaccine counseling -Extensive discussion with the patient regarding the mechanism of action of the mRNA COVID-19 vaccines -Discussed safety data from the child's -Discussed that  no steps were skipped in the process of manufacturing the vaccine and testing for safety, but they were able to be done at the same time as other steps -Encourage patient about the benefits of COVID-19 vaccine and the risks of COVID-19 infection -Given her previous history of anaphylaxis to iodine, she would be a candidate for referral to allergy and immunology for challenge in office prior to getting the COVID-19 vaccine -She will follow up with me when she decides she would like to get the vaccine and I will place referral to allergy at that time  2. Onychomycosis -Followed by podiatry -Reassured her that taking Lamisil is safe and that she had normal LFTs when they were recently checked -We will refill her ciclopirox that she received from dermatology previously, but discussed that this nail  lacquer is often not well covered by insurance - Ciclopirox 8 % KIT; Apply one layer daily.  Dispense: 34.6 mL; Refill: 0  3. Concern about endocrine disease without diagnosis -Reassured patient that her TSH has been normal previously -Discussed that there is no evidence to suggest testing for all hormone levels -Patient is concerned about her ongoing fatigue and is concerned that her thyroid is not functioning normally, despite normal TSH previously -She is requesting referral to endocrinology, which I will placed today - Ambulatory referral to Endocrinology  4. Menorrhagia with irregular cycle -Followed by GYN -This is one of the symptoms that she is concerned could be related to hormonal issues -Advised her to check with her gynecologist about the possibility of checking FSH/LH -She also requests referral to endocrinology as above for concerns about possible endocrine abnormality - Ambulatory referral to Endocrinology  5. Vision changes -New problem with some changes in vision, slowly coming on and not acute in nature -Referral to ophthalmology for eye exam - Ambulatory referral to  Ophthalmology  6. Spell of change in speech -Discussed with patient that her intermittent word finding difficulty and slurred speech without any other neuro findings are unlikely to be symptoms of a stroke -Suspect that these are related to her high stress levels instead -Encouraged self-care and stress relief techniques -Discussed concerning findings and red flag symptoms and return precautions    Meds ordered this encounter  Medications  . Ciclopirox 8 % KIT    Sig: Apply one layer daily.    Dispense:  34.6 mL    Refill:  0    Follow-up as needed  The entirety of the information documented in the History of Present Illness, Review of Systems and Physical Exam were personally obtained by me. Portions of this information were initially documented by Rondel Baton, CMA and reviewed by me for thoroughness and accuracy.    Trampus Mcquerry, Marzella Schlein, MD MPH [facility]

## 2020-02-15 ENCOUNTER — Ambulatory Visit: Payer: Federal, State, Local not specified - PPO | Admitting: Family Medicine

## 2020-02-15 ENCOUNTER — Encounter: Payer: Self-pay | Admitting: Family Medicine

## 2020-02-18 ENCOUNTER — Other Ambulatory Visit: Payer: Self-pay | Admitting: Family Medicine

## 2020-02-18 ENCOUNTER — Telehealth: Payer: Self-pay

## 2020-02-18 MED ORDER — EPINEPHRINE 0.3 MG/0.3ML IJ SOAJ: 0.3000 mg | INTRAMUSCULAR | 1 refills | Status: AC | PRN

## 2020-02-18 NOTE — Telephone Encounter (Signed)
Lmtcb. Called patient to let her know that her insurance does not cover prescription for Ciclopirox Treatment, and to call us back to let us know if she is willing to try something else or pay out of pocket.

## 2020-02-18 NOTE — Telephone Encounter (Signed)
Requested medication (s) are due for refill today: Unsure  Requested medication (s) are on the active medication list: No  Last refill: 10/20/18  Future visit scheduled: No  Notes to clinic:  Prescription has expired and is not on medication list.    Requested Prescriptions  Pending Prescriptions Disp Refills   EPINEPHRINE 0.3 mg/0.3 mL IJ SOAJ injection [Pharmacy Med Name: EPINEPHRINE 0.3 MG AUTO-INJECT 2-PK] 2 each 0    Sig: INJECT 0.3 MLS INTO THE MUSCLE ONCE FOR 1 DOSE      Immunology: Antidotes Passed - 02/18/2020 12:21 PM      Passed - Valid encounter within last 12 months    Recent Outpatient Visits           5 days ago Vaccine counseling   [facility] [place], Dionne Bucy, MD   3 months ago Yeast infection   [place], [place], [place]   7 months ago Sore throat   [place], [place], [place]   8 months ago Yeast infection   [facility], [place], [place]   11 months ago Viral URI with cough   [facility], Dionne Bucy, MD

## 2020-02-18 NOTE — Telephone Encounter (Signed)
Hello  Dr. Brita Romp has completed the forms. Will you be picking up forms or do we fax them for you? We did not see a fax number on the forms. Also called earlier to let you know that your insurance does not cover Ciclopirox. Please call or send [place] a message.

## 2020-02-18 NOTE — Telephone Encounter (Signed)
Epi-pen was refilled.  Form completed.  Unsure if she needs to pick it up or has a fax number for [place] to send it to

## 2020-02-19 DIAGNOSIS — F331 Major depressive disorder, recurrent, moderate: Secondary | ICD-10-CM | POA: Diagnosis not present

## 2020-02-19 NOTE — Telephone Encounter (Signed)
Call to patient home regarding daughter's results- patient request: Can the forms she sent in [place]- be sent back to her in the filled out form? if not let her know and she will pick up. Patient states the medication requested for toenail fungus is not covered as kit- is there a way to order medication only? Permission to leave message on home number given per patient.

## 2020-02-20 ENCOUNTER — Encounter: Payer: Self-pay | Admitting: Family Medicine

## 2020-02-20 MED ORDER — CICLOPIROX 8 % EX SOLN: Freq: Every day | CUTANEOUS | 0 refills | Status: DC

## 2020-02-20 NOTE — Telephone Encounter (Signed)
I do not know how to send as attachment through [place], do you? The kit is the only way to order in Epic, but may be able to call pharmacy to try to switch Rx.

## 2020-02-25 DIAGNOSIS — G4733 Obstructive sleep apnea (adult) (pediatric): Secondary | ICD-10-CM | POA: Diagnosis not present

## 2020-02-27 DIAGNOSIS — F331 Major depressive disorder, recurrent, moderate: Secondary | ICD-10-CM | POA: Diagnosis not present

## 2020-03-04 DIAGNOSIS — F331 Major depressive disorder, recurrent, moderate: Secondary | ICD-10-CM | POA: Diagnosis not present

## 2020-03-07 DIAGNOSIS — T8339XA Other mechanical complication of intrauterine contraceptive device, initial encounter: Secondary | ICD-10-CM | POA: Diagnosis not present

## 2020-03-11 DIAGNOSIS — F331 Major depressive disorder, recurrent, moderate: Secondary | ICD-10-CM | POA: Diagnosis not present

## 2020-03-20 ENCOUNTER — Telehealth: Payer: Self-pay

## 2020-03-20 DIAGNOSIS — T8052XA Anaphylactic reaction due to vaccination, initial encounter: Secondary | ICD-10-CM

## 2020-03-20 DIAGNOSIS — F331 Major depressive disorder, recurrent, moderate: Secondary | ICD-10-CM | POA: Diagnosis not present

## 2020-03-20 NOTE — Telephone Encounter (Signed)
Copied from [place] [id number]. Topic: General - Inquiry >> Mar 20, 2020 12:21 PM Richardo Priest, [place] wrote: Reason for CRM: Patient called in stating that she would like to do the vaccine challenge, as discussed last appointment with PCP. Patient did discuss with her allergy specialist and was advised they do not do it in their office and that a teaching hospital may be the best location for her to go. Patient would like to set this up and would like assistance as she does not a referral at the moment. Please advise.

## 2020-03-21 DIAGNOSIS — N921 Excessive and frequent menstruation with irregular cycle: Secondary | ICD-10-CM | POA: Diagnosis not present

## 2020-03-21 DIAGNOSIS — A609 Anogenital herpesviral infection, unspecified: Secondary | ICD-10-CM | POA: Diagnosis not present

## 2020-03-21 DIAGNOSIS — E559 Vitamin D deficiency, unspecified: Secondary | ICD-10-CM | POA: Diagnosis not present

## 2020-03-21 DIAGNOSIS — Z01419 Encounter for gynecological examination (general) (routine) without abnormal findings: Secondary | ICD-10-CM | POA: Diagnosis not present

## 2020-03-24 NOTE — Addendum Note (Signed)
Addended by: Shawna Orleans on: 03/24/2020 02:31 PM   Modules accepted: Orders

## 2020-03-24 NOTE — Telephone Encounter (Signed)
[facility] told us they are doing this challenge for COVID vaccine.  Please place referral (h/o anaphylaxis)

## 2020-03-25 DIAGNOSIS — F331 Major depressive disorder, recurrent, moderate: Secondary | ICD-10-CM | POA: Diagnosis not present

## 2020-03-26 ENCOUNTER — Telehealth: Payer: Self-pay | Admitting: Allergy

## 2020-03-26 NOTE — Telephone Encounter (Signed)
Called patient to schedule new patient appointment to transfer care, unable to leave message-mailbox full.

## 2020-03-26 NOTE — Telephone Encounter (Signed)
Please call patient.  I'm not sure about the mast cell disease without further work up.  Food allergy is not a contraindication for the Covid vaccine.  These are our prescreening questions:  Any known reactions to polyethylene glycol or polysorbate?   Any history of anaphylaxis to vaccinations, including a COVID19 vaccine?  Any history of reactions to injectable medications?  Any history of anaphylaxis to colonoscopy preps (i.e.Miralax)?  Any history of dermal filler treatments in the last year?  If the answer to all the above are no then she can get the vaccine without further work up.  Recommend taking zyrtec 62m 1-2 hours before the vaccine and wait for 30 minutes afterwards.  As of now, we don't offer the vaccine in our office. We do offer some of its component testing but based on the history so far, she doesn't meet criteria for the component testing.

## 2020-03-26 NOTE — Telephone Encounter (Signed)
Referral sent over by Lavon Paganini from [facility] for anaphylaxis to COVID vaccine. Please review and advise if testing needs to be scheduled.

## 2020-03-26 NOTE — Telephone Encounter (Signed)
Called and spoke with patient and she did reply no to all questions. She will take 2 zyrtec prior to her vaccine since she already takes a Zyrtec daily. She is still going to schedule for a new patient appointment to establish care with [place] because she is not happy with her current allergist. She has been sent to [facility] for that appointment to be scheduled.

## 2020-03-26 NOTE — Telephone Encounter (Signed)
I called and spoke with this patient and she stated that she has not received her first COVID vaccine yet but she has come concerns prior to receiving the vaccine. She states that she is seen at [facility] allergy and asthma and has skin tested negative to foods but she broke out in hives this past weekend after eating 2 shrimp and she is wondering if she may have mast cell disease? She states that she has had anaphylaxis to iodine. She states that she has had miralax without issue and that to her knowledge she has not had prednisolone or triamcinolone. She states that she does not have a history to allergic reactions to vaccines but she does avoid the Flu vaccine due to her sensitivity to eggs. Her first vaccine is scheduled for 03/29/2020. Please advise if this patient needs to be scheduled to be seen with [place]. She states that when she called her allergist that were not helpful and recommended she go to a "testing hospital" in regards to this and receive her COVID vaccine in a controlled environment at a doctors office. Please advise.

## 2020-03-29 ENCOUNTER — Ambulatory Visit: Payer: Federal, State, Local not specified - PPO | Attending: Internal Medicine

## 2020-03-29 DIAGNOSIS — Z23 Encounter for immunization: Secondary | ICD-10-CM

## 2020-03-29 NOTE — Progress Notes (Signed)
   Covid-19 Vaccination Clinic  Name:  Brenda Ross    MRN: [id number] DOB: May 04, 1976  03/29/2020  Brenda Ross was observed post Covid-19 immunization for 30 minutes based on pre-vaccination screening without incident. She was provided with Vaccine Information Sheet and instruction to access the V-Safe system.   Brenda Ross was instructed to call 911 with any severe reactions post vaccine: [name] Kitchen Difficulty breathing  . Swelling of face and throat  . A fast heartbeat  . A bad rash all over body  . Dizziness and weakness   Immunizations Administered    Name Date Dose VIS Date Route   Pfizer COVID-19 Vaccine 03/29/2020 12:20 PM 0.3 mL 11/23/2019 Intramuscular   Manufacturer: [employer], [employer]   Lot: [id number]   [place]: [id number]

## 2020-04-01 DIAGNOSIS — F331 Major depressive disorder, recurrent, moderate: Secondary | ICD-10-CM | POA: Diagnosis not present

## 2020-04-08 DIAGNOSIS — F331 Major depressive disorder, recurrent, moderate: Secondary | ICD-10-CM | POA: Diagnosis not present

## 2020-04-10 ENCOUNTER — Encounter: Payer: Self-pay | Admitting: Family Medicine

## 2020-04-10 ENCOUNTER — Ambulatory Visit: Payer: Federal, State, Local not specified - PPO | Admitting: Family Medicine

## 2020-04-10 ENCOUNTER — Other Ambulatory Visit: Payer: Self-pay

## 2020-04-10 VITALS — BP 120/63 | HR 68 | Temp 97.3°F | Resp 16 | Ht 64.5 in | Wt 289.0 lb

## 2020-04-10 DIAGNOSIS — R1013 Epigastric pain: Secondary | ICD-10-CM | POA: Diagnosis not present

## 2020-04-10 DIAGNOSIS — R599 Enlarged lymph nodes, unspecified: Secondary | ICD-10-CM | POA: Diagnosis not present

## 2020-04-10 DIAGNOSIS — R110 Nausea: Secondary | ICD-10-CM

## 2020-04-10 DIAGNOSIS — M779 Enthesopathy, unspecified: Secondary | ICD-10-CM

## 2020-04-10 DIAGNOSIS — R7309 Other abnormal glucose: Secondary | ICD-10-CM | POA: Diagnosis not present

## 2020-04-10 MED ORDER — ONDANSETRON HCL 4 MG PO TABS: 4.0000 mg | ORAL_TABLET | Freq: Three times a day (TID) | ORAL | 0 refills | Status: DC | PRN

## 2020-04-10 NOTE — Progress Notes (Signed)
Established patient visit   Patient: Brenda Ross   DOB: [birth year]   44 y.o. Female  MRN: [id number] Visit Date: 04/10/2020  I,Sulibeya S Dimas,acting as a scribe for Shirlee Latch, MD.,have documented all relevant documentation on the behalf of Shirlee Latch, MD,as directed by  Shirlee Latch, MD while in the presence of Shirlee Latch, MD.  Today's healthcare provider: Shirlee Latch, MD   Chief Complaint  Patient presents with  . Lymphadenopathy  . Abdominal Pain  . Hand Pain   Subjective    HPI  Patient here today C/O lymph node swollen on right side of neck x several months. Patient reports that she got COVID vaccine about 2 weeks ago, and reports that the gland got even bigger.  Patient C/O stomach ache, nausea, and diarrhea x's several weeks. Patient reports that she has had 3 episodes of this symptoms. 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 Patient concerned that valtrex is causing symptoms. Patient reports that she had been using daily. Patient has discontinued now.  Patient c/o right had pain and swelling. Patient reports she does use ice and reports good symptom control.   Social History   Tobacco Use  . Smoking status: Former Smoker    Packs/day: 1.00    Years: 3.00    Pack years: 3.00    Types: Cigarettes    Quit date: 01/14/1996    Years since quitting: 24.2  . Smokeless tobacco: Never Used  Substance Use Topics  . Alcohol use: Yes    Alcohol/week: 7.0 - 14.0 standard drinks    Types: 7 - 14 Glasses of wine per week  . Drug use: Never       Medications: Outpatient Medications Prior to Visit  Medication Sig  . cetirizine (ZYRTEC) 10 MG tablet Take 10 mg by mouth daily.  [name] Kitchen diltiazem (TIAZAC) 120 MG 24 hr capsule Take 120 mg by mouth daily.  [name] Kitchen EPINEPHRINE 0.3 mg/0.3 mL IJ SOAJ injection INJECT 0.3 MLS INTO THE MUSCLE ONCE FOR 1 DOSE  . famotidine (PEPCID) 40 MG tablet   . ketoconazole (NIZORAL) 2 % cream Apply 1  application topically daily.  [name] Kitchen losartan (COZAAR) 100 MG tablet Take 1 tablet by mouth daily.  . montelukast (SINGULAIR) 10 MG tablet Take 10 mg by mouth daily.  . pantoprazole (PROTONIX) 40 MG tablet Take 1 tablet by mouth 2 (two) times daily.  . ciclopirox (PENLAC) 8 % solution Apply topically at bedtime. Apply over nail and surrounding skin. Apply daily over previous coat. After seven (7) days, may remove (Patient not taking: Reported on 04/10/2020)  . EPINEPHrine 0.3 mg/0.3 mL IJ SOAJ injection Inject 0.3 mLs (0.3 mg total) into the muscle as needed for anaphylaxis.  [name] Kitchen terbinafine (LAMISIL) 250 MG tablet Take 1 tablet (250 mg total) by mouth daily. (Patient not taking: Reported on 02/13/2020)  . valACYclovir (VALTREX) 500 MG tablet Take 1 tablet (500 mg total) by mouth 2 (two) times daily. As needed for future outbreaks (Patient not taking: Reported on 04/10/2020)  . [DISCONTINUED] fluconazole (DIFLUCAN) 150 MG tablet Take 1 pill on day 1 and 1 pill 3 days later if still symptomatic. (Patient not taking: Reported on 02/13/2020)   No facility-administered medications prior to visit.    Review of Systems  Constitutional: Negative for appetite change, chills and fever.  Respiratory: Negative for cough and shortness of breath.   Cardiovascular: Negative for chest pain and palpitations.  Gastrointestinal: Positive for abdominal pain, diarrhea and nausea.  Last CBC Lab Results  Component Value Date   WBC 9.6 12/21/2019   HGB 11.7 12/21/2019   HCT 36.9 12/21/2019   MCV 83 12/21/2019   MCH 26.4 (L) 12/21/2019   RDW 14.1 12/21/2019   PLT 262 12/21/2019   Last metabolic panel Lab Results  Component Value Date   GLUCOSE 105 (H) 08/06/2019   NA 141 08/06/2019   K 4.2 08/06/2019   CL 104 08/06/2019   CO2 21 08/06/2019   BUN 11 08/06/2019   CREATININE 0.80 08/06/2019   GFRNONAA 90 08/06/2019   GFRAA 104 08/06/2019   CALCIUM 9.2 08/06/2019   PROT 7.0 12/21/2019   ALBUMIN 4.4 12/21/2019     LABGLOB 2.5 08/06/2019   AGRATIO 1.7 08/06/2019   BILITOT 0.5 12/21/2019   ALKPHOS 81 12/21/2019   AST 17 12/21/2019   ALT 24 12/21/2019   ANIONGAP 8 10/18/2018      Objective    BP 120/63 (BP Location: Left Arm, Patient Position: Sitting, Cuff Size: Large)   Pulse 68   Temp (!) 97.3 F (36.3 C) (Temporal)   Resp 16   Ht 5' 4.5" (1.638 m)   Wt 289 lb (131.1 kg)   BMI 48.84 kg/m  BP Readings from Last 3 Encounters:  04/10/20 120/63  11/07/19 132/82  06/25/19 132/76   Wt Readings from Last 3 Encounters:  04/10/20 289 lb (131.1 kg)  11/07/19 293 lb 3.2 oz (133 kg)  06/25/19 298 lb 14.4 oz (135.6 kg)      Physical Exam Vitals reviewed.  Constitutional:      General: She is not in acute distress.    Appearance: She is well-developed.  HENT:     Head: Normocephalic and atraumatic.  Eyes:     General: No scleral icterus.    Conjunctiva/sclera: Conjunctivae normal.  Cardiovascular:     Rate and Rhythm: Normal rate and regular rhythm.     Pulses: Normal pulses.     Heart sounds: Normal heart sounds. No murmur.  Pulmonary:     Effort: Pulmonary effort is normal. No respiratory distress.     Breath sounds: Normal breath sounds. No wheezing.  Musculoskeletal:     Right hand: Swelling and tenderness present.     Right lower leg: No edema.     Left lower leg: No edema.     Comments: R wrist: Normal ROM, mild swelling, no erythema. Negative finkelsteins.  TTP over base of thumb  Lymphadenopathy:     Cervical: Cervical adenopathy (2-3 cm, mobile, tender, no erythema) present.     Comments: 2'' right cervical lymph node mobile and tender  Skin:    General: Skin is warm and dry.     Findings: No rash.  Neurological:     Mental Status: She is alert and oriented to person, place, and time. Mental status is at baseline.  Psychiatric:        Mood and Affect: Mood normal.        Behavior: Behavior normal.      No results found for any visits on 04/10/20.  Assessment &  Plan     1. Swollen lymph nodes Recurrent Worsening after getting COVID vaccine Check lab Refer for ultra sound Follow up pending results - CBC with Differential/Platelet - US Soft Tissue Head/Neck (NON-THYROID); Future  2. Nausea Stable Check lab Start Zofran as below - CBC with Differential/Platelet - ondansetron (ZOFRAN) 4 MG tablet; Take 1 tablet (4 mg total) by mouth every 8 (eight) hours as  needed for nausea or vomiting.  Dispense: 20 tablet; Refill: 0  3. Epigastric pain Recurrent Stable Check labs  Refer to GI  - CBC with Differential/Platelet - Ambulatory referral to Gastroenterology  4. Elevated hemoglobin A1c Check lab Follow up pending results - Hemoglobin A1c  5. Tendinitis - new problem - R wrist - advised RICE - may need brace for repetitive motions at work - return precautions discussed  Return if symptoms worsen or fail to improve.      I, Shirlee Latch, MD, have reviewed all documentation for this visit. The documentation on 04/10/20 for the exam, diagnosis, procedures, and orders are all accurate and complete.   Tildon Silveria, Marzella Schlein, MD, MPH [facility]

## 2020-04-10 NOTE — Assessment & Plan Note (Signed)
Recurrent Worse after getting COVID vaccine. Referral for ultra sound Follow up pending result

## 2020-04-11 ENCOUNTER — Telehealth: Payer: Self-pay

## 2020-04-11 LAB — CBC WITH DIFFERENTIAL/PLATELET
Basophils Absolute: 0 10*3/uL (ref 0.0–0.2)
Basos: 0 %
EOS (ABSOLUTE): 0.2 10*3/uL (ref 0.0–0.4)
Eos: 3 %
Hematocrit: 35.1 % (ref 34.0–46.6)
Hemoglobin: 11.5 g/dL (ref 11.1–15.9)
Immature Grans (Abs): 0 10*3/uL (ref 0.0–0.1)
Immature Granulocytes: 1 %
Lymphocytes Absolute: 2.2 10*3/uL (ref 0.7–3.1)
Lymphs: 28 %
MCH: 27 pg (ref 26.6–33.0)
MCHC: 32.8 g/dL (ref 31.5–35.7)
MCV: 82 fL (ref 79–97)
Monocytes Absolute: 0.4 10*3/uL (ref 0.1–0.9)
Monocytes: 6 %
Neutrophils Absolute: 4.8 10*3/uL (ref 1.4–7.0)
Neutrophils: 62 %
Platelets: 257 10*3/uL (ref 150–450)
RBC: 4.26 x10E6/uL (ref 3.77–5.28)
RDW: 13.5 % (ref 11.7–15.4)
WBC: 7.7 10*3/uL (ref 3.4–10.8)

## 2020-04-11 LAB — HEMOGLOBIN A1C
Est. average glucose Bld gHb Est-mCnc: 117 mg/dL
Hgb A1c MFr Bld: 5.7 % — ABNORMAL HIGH (ref 4.8–5.6)

## 2020-04-11 NOTE — Telephone Encounter (Signed)
LMTCB 04/11/2020.  PEC please advise pt of lab results below.   Thanks,   -Mickel Baas

## 2020-04-11 NOTE — Telephone Encounter (Signed)
-----   Message from Virginia Crews, MD sent at 04/11/2020  9:26 AM EDT ----- Normal blood counts.  A1c stable in prediabetic range

## 2020-04-11 NOTE — Telephone Encounter (Signed)
Pt. Called back, given results. Verbalizes understanding.

## 2020-04-15 ENCOUNTER — Ambulatory Visit
Admission: RE | Admit: 2020-04-15 | Discharge: 2020-04-15 | Disposition: A | Discharge status: Home / Self Care | Payer: Federal, State, Local not specified - PPO | Source: Ambulatory Visit | Attending: Family Medicine | Admitting: Family Medicine

## 2020-04-15 ENCOUNTER — Other Ambulatory Visit: Payer: Self-pay

## 2020-04-15 DIAGNOSIS — F331 Major depressive disorder, recurrent, moderate: Secondary | ICD-10-CM | POA: Diagnosis not present

## 2020-04-15 DIAGNOSIS — R599 Enlarged lymph nodes, unspecified: Secondary | ICD-10-CM | POA: Diagnosis not present

## 2020-04-16 ENCOUNTER — Telehealth: Payer: Self-pay

## 2020-04-16 NOTE — Telephone Encounter (Signed)
Result Communications   Result Notes and Comments to Patient Comment seen by patient Brenda Ross on 04/16/2020 9:05 AM EDT

## 2020-04-16 NOTE — Telephone Encounter (Signed)
-----   Message from Virginia Crews, MD sent at 04/16/2020  9:04 AM EDT ----- Lymph node is visualized as slightly enlarged.  No worrisome features.  Likely reactive.  If still present in 4-6 weeks, will consider surgery referral for possible biopsy.

## 2020-04-21 ENCOUNTER — Encounter: Payer: Self-pay | Admitting: Family Medicine

## 2020-04-21 DIAGNOSIS — M25551 Pain in right hip: Secondary | ICD-10-CM

## 2020-04-21 NOTE — Telephone Encounter (Signed)
Okay to place a referral as requested

## 2020-04-22 DIAGNOSIS — F331 Major depressive disorder, recurrent, moderate: Secondary | ICD-10-CM | POA: Diagnosis not present

## 2020-04-23 ENCOUNTER — Ambulatory Visit: Payer: Federal, State, Local not specified - PPO | Attending: Internal Medicine

## 2020-04-23 DIAGNOSIS — Z23 Encounter for immunization: Secondary | ICD-10-CM

## 2020-04-23 NOTE — Progress Notes (Signed)
   Covid-19 Vaccination Clinic  Name:  Brenda Ross    MRN: [id number] DOB: 08/20/1976  04/23/2020  Brenda Ross was observed post Covid-19 immunization for 15 minutes without incident. She was provided with Vaccine Information Sheet and instruction to access the V-Safe system.   Brenda Ross was instructed to call 911 with any severe reactions post vaccine: [name] Kitchen Difficulty breathing  . Swelling of face and throat  . A fast heartbeat  . A bad rash all over body  . Dizziness and weakness   Immunizations Administered    Name Date Dose VIS Date Route   Pfizer COVID-19 Vaccine 04/23/2020  4:01 PM 0.3 mL 02/06/2019 Intramuscular   Manufacturer: [place]   Lot: [id number]   [place]: [id number]

## 2020-04-29 DIAGNOSIS — F331 Major depressive disorder, recurrent, moderate: Secondary | ICD-10-CM | POA: Diagnosis not present

## 2020-04-29 DIAGNOSIS — E119 Type 2 diabetes mellitus without complications: Secondary | ICD-10-CM | POA: Diagnosis not present

## 2020-04-29 LAB — HM DIABETES EYE EXAM

## 2020-05-02 DIAGNOSIS — M7061 Trochanteric bursitis, right hip: Secondary | ICD-10-CM | POA: Diagnosis not present

## 2020-05-02 DIAGNOSIS — M7062 Trochanteric bursitis, left hip: Secondary | ICD-10-CM | POA: Diagnosis not present

## 2020-05-05 ENCOUNTER — Encounter: Payer: Self-pay | Admitting: Family Medicine

## 2020-05-06 DIAGNOSIS — F331 Major depressive disorder, recurrent, moderate: Secondary | ICD-10-CM | POA: Diagnosis not present

## 2020-05-09 ENCOUNTER — Ambulatory Visit: Payer: Federal, State, Local not specified - PPO | Admitting: Allergy

## 2020-05-13 DIAGNOSIS — F331 Major depressive disorder, recurrent, moderate: Secondary | ICD-10-CM | POA: Diagnosis not present

## 2020-05-14 ENCOUNTER — Encounter: Payer: Self-pay | Admitting: Family Medicine

## 2020-05-14 DIAGNOSIS — M25552 Pain in left hip: Secondary | ICD-10-CM | POA: Diagnosis not present

## 2020-05-14 DIAGNOSIS — M25551 Pain in right hip: Secondary | ICD-10-CM | POA: Diagnosis not present

## 2020-05-15 ENCOUNTER — Encounter: Payer: Self-pay | Admitting: Gastroenterology

## 2020-05-15 ENCOUNTER — Other Ambulatory Visit: Payer: Self-pay

## 2020-05-15 ENCOUNTER — Ambulatory Visit (INDEPENDENT_AMBULATORY_CARE_PROVIDER_SITE_OTHER): Payer: Federal, State, Local not specified - PPO | Admitting: Gastroenterology

## 2020-05-15 VITALS — BP 119/70 | HR 62 | Temp 98.4°F | Ht 64.5 in | Wt 285.0 lb

## 2020-05-15 DIAGNOSIS — R197 Diarrhea, unspecified: Secondary | ICD-10-CM

## 2020-05-15 DIAGNOSIS — R109 Unspecified abdominal pain: Secondary | ICD-10-CM

## 2020-05-15 DIAGNOSIS — R112 Nausea with vomiting, unspecified: Secondary | ICD-10-CM

## 2020-05-15 NOTE — Progress Notes (Signed)
Gastroenterology Consultation  Referring Provider:     Virginia Crews, MD Primary Care Physician:  Virginia Crews, MD Primary Gastroenterologist:  Dr. Allen Norris     Reason for Consultation:     Epigastric pain        HPI:   Brenda Ross is a 44 y.o. y/o female referred for consultation & management of epigastric pain by Dr. Brita Romp, Dionne Bucy, MD.  This patient comes in today with epigastric pain.  The patient had a colonoscopy in 2019 by Dr. Sabra Heck in [place].  The patient was found to have diverticulosis on that colonoscopy and it was reported to be done for iron deficiency anemia.  The patient has a history of chronic nausea and is on Zofran.  She also had a recent CBC that showed her CBC to be normal.  The patient reports that her anemia was likely due to GYN issues.. 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  She also states that she has chronic diarrhea typically more than 4 days a week.  She also suffers from heartburn and takes to Protonix a day in addition to Pepcid twice a day.  There is no report of any unexplained weight loss but the patient states that she is trying to lose weight. The patient has had 2 episodes of significant nausea without any precipitating events to explain the nausea.  The patient's nausea is usually later in the day and she denies any worsening in the morning when she wakes up.  There is also no report of any dysphagia.  She does report that she also had an upper endoscopy with biopsies that did not show any sign of villi blunting indicating that she was doing well on her gluten-free diet.  She also has multiple family members who are on gluten-free diets because they have celiac sprue.  Past Medical History:  Diagnosis Date  . Allergic rhinitis   . Anemia   . Anxiety   . GERD (gastroesophageal reflux disease)   . Herpes genitalia   . Prediabetes   . Sleep apnea     Past Surgical History:    Procedure Laterality Date  . APPENDECTOMY      Prior to Admission medications   Medication Sig Start Date End Date Taking? Authorizing Provider  cetirizine (ZYRTEC) 10 MG tablet Take 10 mg by mouth daily.    [provider]  ciclopirox (PENLAC) 8 % solution Apply topically at bedtime. Apply over nail and surrounding skin. Apply daily over previous coat. After seven (7) days, may remove Patient not taking: Reported on 04/10/2020 02/20/20   Virginia Crews, MD  diltiazem [facility]) 120 MG 24 hr capsule Take 120 mg by mouth daily. 10/08/19   [provider]  EPINEPHRINE 0.3 mg/0.3 mL IJ SOAJ injection INJECT 0.3 MLS INTO THE MUSCLE ONCE FOR 1 DOSE 02/19/20   Bacigalupo, Dionne Bucy, MD  EPINEPHrine 0.3 mg/0.3 mL IJ SOAJ injection Inject 0.3 mLs (0.3 mg total) into the muscle as needed for anaphylaxis. 02/18/20   Virginia Crews, MD  famotidine (PEPCID) 40 MG tablet  10/08/19   [provider]  ketoconazole (NIZORAL) 2 % cream Apply 1 application topically daily. 12/21/19   Edrick Kins, DPM  losartan (COZAAR) 100 MG tablet Take 1 tablet by mouth daily. 07/30/18   [provider]  montelukast (SINGULAIR) 10 MG tablet Take 10 mg by mouth daily.    [provider]  ondansetron (ZOFRAN) 4 MG tablet Take 1 tablet (4 mg total) by mouth every 8 (eight) hours as needed for nausea or vomiting. 04/10/20   Bacigalupo, Dionne Bucy, MD  pantoprazole (PROTONIX) 40 MG tablet Take 1 tablet by mouth 2 (two) times daily. 05/16/18   [provider]  terbinafine (LAMISIL) 250 MG tablet Take 1 tablet (250 mg total) by mouth daily. Patient not taking: Reported on 02/13/2020 12/21/19   Edrick Kins, DPM  valACYclovir (VALTREX) 500 MG tablet Take 1 tablet (500 mg total) by mouth 2 (two) times daily. As needed for future outbreaks Patient not taking: Reported on 04/10/2020 10/28/18   Virginia Crews, MD    Family History  Problem Relation Age of Onset  . Diabetes  Mother   . Hypertension Mother   . Heart disease Mother   . Hypertension Father   . Heart disease Father   . Prostate cancer Father   . Diabetes Father   . Diabetes Brother   . ADD / ADHD Brother   . ADD / ADHD Brother   . Breast cancer Neg Hx   . Colon cancer Neg Hx   . Ovarian cancer Neg Hx      Social History   Tobacco Use  . Smoking status: Former Smoker    Packs/day: 1.00    Years: 3.00    Pack years: 3.00    Types: Cigarettes    Quit date: 01/14/1996    Years since quitting: 24.3  . Smokeless tobacco: Never Used  Substance Use Topics  . Alcohol use: Yes    Alcohol/week: 7.0 - 14.0 standard drinks    Types: 7 - 14 Glasses of wine per week  . Drug use: Never    Allergies as of 05/15/2020 - Review Complete 04/10/2020  Allergen Reaction Noted  . Iodinated diagnostic agents Hives, Shortness Of Breath, and Swelling 10/19/2018  . Eggs or egg-derived products Diarrhea 06/13/2018  . Gluten meal Diarrhea 02/19/2014  . Pea Diarrhea 06/13/2018    Review of Systems:    All systems reviewed and negative except where noted in HPI.   Physical Exam:  There were no vitals taken for this visit. No LMP recorded. (Menstrual status: IUD). General:   Alert,  Well-developed, well-nourished, pleasant and cooperative in NAD Head:  Normocephalic and atraumatic. Eyes:  Sclera clear, no icterus.   Conjunctiva pink. Ears:  Normal auditory acuity. Neck:  Supple; no masses or thyromegaly. Lungs:  Respirations even and unlabored.  Clear throughout to auscultation.   No wheezes, crackles, or rhonchi. No acute distress. Heart:  Regular rate and rhythm; no murmurs, clicks, rubs, or gallops. Abdomen:  Normal bowel sounds.  No bruits.  Soft, non-tender and non-distended without masses, hepatosplenomegaly or hernias noted.  No guarding or rebound tenderness.  Negative Carnett sign.   Rectal:  Deferred.  Pulses:  Normal pulses noted. Extremities:  No clubbing or edema.  No cyanosis. Neurologic:   Alert and oriented x3;  grossly normal neurologically. Skin:  Intact without significant lesions or rashes.  No jaundice. Lymph Nodes:  No significant cervical adenopathy. Psych:  Alert and cooperative. Normal mood and affect.  Imaging Studies: US Soft Tissue Head/Neck (NON-THYROID)  Result Date: 04/15/2020 CLINICAL DATA:  44 year old female with a history of lymphadenopathy EXAM: ULTRASOUND OF HEAD/NECK SOFT TISSUES TECHNIQUE: Ultrasound examination of the head and neck soft tissues was performed in the area of clinical concern. COMPARISON:  None. FINDINGS: Grayscale and color duplex performed in the region  of clinical concern. Lymph node within the right supraclavicular region with typical architecture maintained measuring short axis dimension of approximately 1 cm. IMPRESSION: Sonographic survey demonstrates borderline enlarged lymph nodes in the right supraclavicular region, potentially reactive though nonspecific. Electronically Signed   By: Corrie Mckusick D.O.   On: 04/15/2020 14:16    Assessment and Plan:   Brenda Ross is a 44 y.o. y/o female who comes in today with a history of nausea intermittent diarrhea who has had an EGD and colonoscopy.  The patient will have her blood sent off for a celiac panel to make sure there is no gluten getting into her diet by mistake.  The patient will also be started on a trial of Dexilant.  She has been told to stop the Protonix and Pepcid and see if the Dexilant helps her symptoms with her nausea.  The patient has also been encouraged to try and decrease her BMI so that her reflux can improve.  The patient has been explained the plan and agrees with it.    Lucilla Lame, MD. Marval Regal    Note: This dictation was prepared with Dragon dictation along with smaller phrase technology. Any transcriptional errors that result from this process are unintentional.

## 2020-05-16 ENCOUNTER — Telehealth: Payer: Self-pay | Admitting: Family Medicine

## 2020-05-16 DIAGNOSIS — J301 Allergic rhinitis due to pollen: Secondary | ICD-10-CM

## 2020-05-16 MED ORDER — MONTELUKAST SODIUM 10 MG PO TABS: 10.0000 mg | ORAL_TABLET | Freq: Every day | ORAL | 1 refills | Status: AC

## 2020-05-16 NOTE — Telephone Encounter (Signed)
Ok to send in #90 r1 as requested

## 2020-05-16 NOTE — Telephone Encounter (Signed)
Medication refill sent to pharmacy for patient.

## 2020-05-16 NOTE — Telephone Encounter (Signed)
Patient is calling to ask if Dr. B can write her a script for montelukast (SINGULAIR) 10 MG tablet [[id number]] while she finds a new allegy doctor. Patient declined appt. Preferred Pharmacy [facility] [phone]

## 2020-05-17 LAB — CELIAC DISEASE PANEL
Endomysial IgA: NEGATIVE
IgA/Immunoglobulin A, Serum: 188 mg/dL (ref 87–352)
Transglutaminase IgA: 2 U/mL (ref 0–3)

## 2020-05-19 ENCOUNTER — Telehealth: Payer: Self-pay

## 2020-05-19 NOTE — Telephone Encounter (Signed)
-----   Message from Lucilla Lame, MD sent at 05/19/2020 10:00 AM EDT ----- Let the patient know the celiac blood work was negative for any exposure to gluten

## 2020-05-19 NOTE — Progress Notes (Signed)
Let the patient know the celiac blood work was negative for any exposure to gluten

## 2020-05-19 NOTE — Telephone Encounter (Signed)
Pt notified of lab result via [place].

## 2020-05-20 DIAGNOSIS — F331 Major depressive disorder, recurrent, moderate: Secondary | ICD-10-CM | POA: Diagnosis not present

## 2020-05-22 DIAGNOSIS — L501 Idiopathic urticaria: Secondary | ICD-10-CM | POA: Diagnosis not present

## 2020-05-22 DIAGNOSIS — K219 Gastro-esophageal reflux disease without esophagitis: Secondary | ICD-10-CM | POA: Diagnosis not present

## 2020-05-22 DIAGNOSIS — J3089 Other allergic rhinitis: Secondary | ICD-10-CM | POA: Diagnosis not present

## 2020-05-22 DIAGNOSIS — H1045 Other chronic allergic conjunctivitis: Secondary | ICD-10-CM | POA: Diagnosis not present

## 2020-05-27 DIAGNOSIS — F331 Major depressive disorder, recurrent, moderate: Secondary | ICD-10-CM | POA: Diagnosis not present

## 2020-05-30 DIAGNOSIS — G4733 Obstructive sleep apnea (adult) (pediatric): Secondary | ICD-10-CM | POA: Diagnosis not present

## 2020-05-30 DIAGNOSIS — M25551 Pain in right hip: Secondary | ICD-10-CM | POA: Diagnosis not present

## 2020-05-30 DIAGNOSIS — M25552 Pain in left hip: Secondary | ICD-10-CM | POA: Diagnosis not present

## 2020-06-02 ENCOUNTER — Other Ambulatory Visit: Payer: Self-pay

## 2020-06-02 ENCOUNTER — Telehealth: Payer: Self-pay | Admitting: Gastroenterology

## 2020-06-02 ENCOUNTER — Telehealth: Payer: Self-pay

## 2020-06-02 MED ORDER — DEXILANT 60 MG PO CPDR
60.0000 mg | DELAYED_RELEASE_CAPSULE | Freq: Every day | ORAL | 3 refills | Status: DC
Start: 2020-06-02 — End: 2020-07-24

## 2020-06-02 NOTE — Telephone Encounter (Signed)
Patient contacted office requesting a prescription for Dexilant 60 mg.  I asked her if she could come by the office to pick up samples, as this medication could require a prior authorization which could take up to 3-5 business days.  She said she is very upset about this, as the prior authorization and prescription were supposed to be sent already.  I told her that we could not initiate a prior authorization until we knew the samples were going to work and send in the prescription.  I again asked her to come by the office to pick up samples and she said this is a big inconvenience to her work schedule.  She said it looks like she may have to come to the office.  Rx was sent electronically to the pharmacy and it does require a prior authorization.  I will work on the prior authorization once I receive the confirmation fax from the pharmacy.  Rx was semt to [facility].  Thanks,  [place], [place]

## 2020-06-02 NOTE — Telephone Encounter (Signed)
Patient called and stated that Dr. Allen Norris had given her a sample medication and wants a refill on it( but don't know the name of the medication). She only has one pill left. I explained to the patient that [facility] and Dr. Allen Norris was on vacation this week and that I would get another nurse to follow up with her. Patient Was very adamant that a nurse call her today and that the matter be taking care of today. I sent the message over to 3 CMA'sSharyn Lull, Caryl Pina & Dijah).Sharyn Lull did follow up with patient.

## 2020-06-06 DIAGNOSIS — M25551 Pain in right hip: Secondary | ICD-10-CM | POA: Diagnosis not present

## 2020-06-06 DIAGNOSIS — M25552 Pain in left hip: Secondary | ICD-10-CM | POA: Diagnosis not present

## 2020-06-09 DIAGNOSIS — M7062 Trochanteric bursitis, left hip: Secondary | ICD-10-CM | POA: Diagnosis not present

## 2020-06-09 DIAGNOSIS — M7061 Trochanteric bursitis, right hip: Secondary | ICD-10-CM | POA: Diagnosis not present

## 2020-06-10 DIAGNOSIS — F331 Major depressive disorder, recurrent, moderate: Secondary | ICD-10-CM | POA: Diagnosis not present

## 2020-06-10 DIAGNOSIS — M25552 Pain in left hip: Secondary | ICD-10-CM | POA: Diagnosis not present

## 2020-06-10 DIAGNOSIS — M25551 Pain in right hip: Secondary | ICD-10-CM | POA: Diagnosis not present

## 2020-06-13 DIAGNOSIS — M25552 Pain in left hip: Secondary | ICD-10-CM | POA: Diagnosis not present

## 2020-06-13 DIAGNOSIS — M25551 Pain in right hip: Secondary | ICD-10-CM | POA: Diagnosis not present

## 2020-06-18 DIAGNOSIS — F331 Major depressive disorder, recurrent, moderate: Secondary | ICD-10-CM | POA: Diagnosis not present

## 2020-06-24 DIAGNOSIS — F331 Major depressive disorder, recurrent, moderate: Secondary | ICD-10-CM | POA: Diagnosis not present

## 2020-06-30 ENCOUNTER — Telehealth: Payer: Self-pay

## 2020-06-30 NOTE — Telephone Encounter (Signed)
Copied from [place] [id number]. Topic: Referral - Status >> Jun 30, 2020  1:12 PM Brenda Ross wrote: Reason for CRM: Dr. Cindra Eves office called and they were able to get the pt scheduled but the pt has no showed her appt/ please advise

## 2020-06-30 NOTE — Telephone Encounter (Signed)
Noted  

## 2020-07-01 DIAGNOSIS — F331 Major depressive disorder, recurrent, moderate: Secondary | ICD-10-CM | POA: Diagnosis not present

## 2020-07-04 ENCOUNTER — Telehealth: Payer: Self-pay

## 2020-07-04 ENCOUNTER — Ambulatory Visit (INDEPENDENT_AMBULATORY_CARE_PROVIDER_SITE_OTHER): Payer: Federal, State, Local not specified - PPO | Admitting: Allergy

## 2020-07-04 ENCOUNTER — Encounter: Payer: Self-pay | Admitting: Allergy

## 2020-07-04 VITALS — BP 124/72 | HR 53 | Temp 98.0°F | Resp 16 | Ht 64.5 in | Wt 285.8 lb

## 2020-07-04 DIAGNOSIS — T508X5D Adverse effect of diagnostic agents, subsequent encounter: Secondary | ICD-10-CM | POA: Diagnosis not present

## 2020-07-04 DIAGNOSIS — J358 Other chronic diseases of tonsils and adenoids: Secondary | ICD-10-CM

## 2020-07-04 DIAGNOSIS — K9049 Malabsorption due to intolerance, not elsewhere classified: Secondary | ICD-10-CM | POA: Diagnosis not present

## 2020-07-04 DIAGNOSIS — T7800XD Anaphylactic reaction due to unspecified food, subsequent encounter: Secondary | ICD-10-CM | POA: Diagnosis not present

## 2020-07-04 DIAGNOSIS — J3089 Other allergic rhinitis: Secondary | ICD-10-CM

## 2020-07-04 DIAGNOSIS — K2100 Gastro-esophageal reflux disease with esophagitis, without bleeding: Secondary | ICD-10-CM

## 2020-07-04 NOTE — Patient Instructions (Addendum)
-  We will request her previous allergy records from [facility] allergy and allergy/ENT from [facility].  These records will help to guide further testing if needed. -She should continue her current allergen avoidance measures for indoor allergens -We discussed today the difference between mildew and mold in the home however with a roof leak she potentially could have a mold but that should be remedied if identified -Advised that she can try levocetirizine which she has at home.  Levocetirizine is a long-acting antihistamine similar to Zyrtec that may be more effective.  Take levocetirizine 5 mg daily as needed.  If effective she can put this in rotation with her Zyrtec. -Continue singular 10 mg daily at bedtime -Discussed trying use of nasal saline spray to help loosen mucus and clean the nose.  This may help her be able to tolerate use of a medicated nasal spray  -Continue avoidance of apples, gluten products, peas, eggs as well as iodinated contrast media -Have access to self-injectable epinephrine (Epipen or AuviQ) 0.64m at all times -Follow emergency action plan in case of allergic reaction -We discussed today future test modalities including fresh fruits prick testing with the foods as above and potential food challenges in the office -Discussed today that if she should need a contrast study that there is a premedication protocol that can be employed for safe use of the contrast  - we have discussed the following in regards to foods:   Allergy: food allergy is when you have eaten a food, developed an allergic reaction after eating the food and have IgE to the food (positive food testing either by skin testing or blood testing).  Food allergy could lead to life threatening symptoms  Sensitivity: occurs when you have IgE to a food (positive food testing either by skin testing or blood testing) but is a food you eat without any issues.  This is not an allergy and we recommend keeping the food in the diet   Intolerance: this is when you have negative testing by either skin testing or blood testing thus not allergic but the food causes symptoms (like belly pain, bloating, diarrhea etc) with ingestion.  These foods should be avoided to prevent symptoms.    -Continue famotidine  -We will place an ENT referral for recurrent tonsil stones  Follow-up in 2 to 3 months

## 2020-07-04 NOTE — Telephone Encounter (Signed)
Patient needs referral for ENT in [facility] for recurrent tonsil stones. Thank you.

## 2020-07-04 NOTE — Progress Notes (Signed)
New Patient Note  RE: Brenda Ross MRN: [id number] DOB: 1976-11-14 Date of Office Visit: 07/04/2020  Referring provider: Virginia Crews, MD Primary care provider: Virginia Crews, MD  Chief Complaint: allergies  History of present illness: Brenda Ross is a 44 y.o. female presenting today for consultation for allergies (environmental, foods, medications).   She has had allergen evaluations previously with [facility] and allergy/ENT in [place], [place].  Her last skin testing was aboug 6-9 months ago and she recalls   Around mowed grass she reports sneezing, nasal congestion/drainage, and itching and sometimes hives.  Symptoms are year-round dust mite, mold and cockroach.  She is concerned as she has noted mildew in the bathroom that does clean off with bleac however states she does have a roof leak that is getting repaired. She states this was odd as her previous testing about 5-6 years ago she was positive to pollens as well.  She has not done allergen immunotherapy.    She has had symptoms of daily headaches, "sinus blockage", congestion/drainage, sneezing.  She is taking Zyrtec daily for past 2-3 years.  She has tried Human resources officer, claritin.  She does have levocetirizine at home but has not tried yet. Taking Singulair for past 1.5 years.  Has used flonase but states she has an aversion to putting something in her nose and states it will cause her gag.  She also states she doesn't do it right.  She is on Famotidine as well for reflux.   She report she does have a deviated septum.    With foods she reports the following: Eggs, peas and gluten she reports developing "really bad" diarrhea, oily substance in stool.  She has had celiac disease for past 15 years.  With apples she has developed generalized hives. She has had times where she has developed hives with shellfish ingestion but states it has not happened with every ingestion.    Also reports reaction to iodinated  contrast media where she developed whole body flushing, hives.  She does report recurrent tonsil stones over the past year.   She has sleep apnea and uses CPAP.   Review of systems: Review of Systems  Constitutional: Negative.   HENT:       See HPI  Eyes: Negative.   Respiratory: Negative.   Cardiovascular: Negative.   Gastrointestinal: Negative.   Musculoskeletal: Negative.   Skin: Negative.   Neurological: Negative.     All other systems negative unless noted above in HPI  Past medical history: Past Medical History:  Diagnosis Date  . Allergic rhinitis   . Anemia   . Anxiety   . GERD (gastroesophageal reflux disease)   . Herpes genitalia   . Prediabetes   . Sleep apnea     Past surgical history: Past Surgical History:  Procedure Laterality Date  . APPENDECTOMY      Family history:  Family History  Problem Relation Age of Onset  . Diabetes Mother   . Hypertension Mother   . Heart disease Mother   . Allergic rhinitis Mother   . Hypertension Father   . Heart disease Father   . Prostate cancer Father   . Diabetes Father   . Diabetes Brother   . ADD / ADHD Brother   . Asthma Brother   . Allergic rhinitis Brother   . ADD / ADHD Brother   . Allergic rhinitis Brother   . Allergic rhinitis Brother   . Asthma Maternal Grandmother   .  Breast cancer Neg Hx   . Colon cancer Neg Hx   . Ovarian cancer Neg Hx     Social history: She lives in a home with carpeting with gas heating and central cooling.  4 dogs in the home.  There is no concern for roaches in the home.  She works from home on the computer all day with occasional domestic travel which stopped with the pandemic.  She has a smoking history from 1992-97 of 1 pack/day  Medication List: Current Outpatient Medications  Medication Sig Dispense Refill  . cetirizine (ZYRTEC) 10 MG tablet Take 10 mg by mouth daily.    [name] Kitchen dexlansoprazole (DEXILANT) 60 MG capsule Take 1 capsule (60 mg total) by mouth daily. 90  capsule 3  . EPINEPHrine 0.3 mg/0.3 mL IJ SOAJ injection Inject 0.3 mLs (0.3 mg total) into the muscle as needed for anaphylaxis. 2 each 1  . famotidine (PEPCID) 40 MG tablet     . losartan (COZAAR) 100 MG tablet Take 1 tablet by mouth daily.    . montelukast (SINGULAIR) 10 MG tablet Take 1 tablet (10 mg total) by mouth daily. 90 tablet 1  . pantoprazole (PROTONIX) 40 MG tablet Take 1 tablet by mouth 2 (two) times daily.    [name] Kitchen diltiazem (TIAZAC) 120 MG 24 hr capsule Take 120 mg by mouth daily.     No current facility-administered medications for this visit.    Known medication allergies: Allergies  Allergen Reactions  . Iodinated Diagnostic Agents Hives, Shortness Of Breath and Swelling  . Eggs Or Egg-Derived Products Diarrhea  . Gluten Meal Diarrhea    Celiac's disease  . Pea Diarrhea     Physical examination: Blood pressure 124/72, pulse 53, temperature 98 F (36.7 C), temperature source Temporal, resp. rate 16, height 5' 4.5" (1.638 m), weight (!) 285 lb 12.8 oz (129.6 kg), SpO2 98 %.  General: Alert, interactive, in no acute distress. HEENT: PERRLA, TMs pearly gray, turbinates moderately edematous with clear discharge, post-pharynx non erythematous. Neck: Supple without lymphadenopathy. Lungs: Clear to auscultation without wheezing, rhonchi or rales. {no increased work of breathing. CV: Normal S1, S2 without murmurs. Abdomen: Nondistended, nontender. Skin: Warm and dry, without lesions or rashes. Extremities:  No clubbing, cyanosis or edema. Neuro:   Grossly intact.  Diagnositics/Labs: None today  Assessment and plan: Allergic rhinitis Food allergy  Food intolerance  Adverse effect of drug GERD Tonsil stones   -We will request her previous allergy records from [facility] allergy and allergy/ENT from [place].  These records will help to guide further testing if needed. -She should continue her current allergen avoidance measures for indoor allergens -We discussed today  the difference between mildew and mold in the home however with a roof leak she potentially could have a mold but that should be remedied if identified -Advised that she can try levocetirizine which she has at home.  Levocetirizine is a long-acting antihistamine similar to Zyrtec that may be more effective.  Take levocetirizine 5 mg daily as needed.  If effective she can put this in rotation with her Zyrtec. -Continue singular 10 mg daily at bedtime -Discussed trying use of nasal saline spray to help loosen mucus and clean the nose.  This may help her be able to tolerate use of a medicated nasal spray  -Continue avoidance of apples, gluten products, peas, eggs as well as iodinated contrast media -Have access to self-injectable epinephrine (Epipen or AuviQ) 0.47m at all times -Follow emergency action plan in case of allergic  reaction -We discussed today future test modalities including fresh fruits prick testing with the foods as above and potential food challenges in the office -Discussed today that if she should need a contrast study that there is a premedication protocol that can be employed for safe use of the contrast  - we have discussed the following in regards to foods:   Allergy: food allergy is when you have eaten a food, developed an allergic reaction after eating the food and have IgE to the food (positive food testing either by skin testing or blood testing).  Food allergy could lead to life threatening symptoms  Sensitivity: occurs when you have IgE to a food (positive food testing either by skin testing or blood testing) but is a food you eat without any issues.  This is not an allergy and we recommend keeping the food in the diet  Intolerance: this is when you have negative testing by either skin testing or blood testing thus not allergic but the food causes symptoms (like belly pain, bloating, diarrhea etc) with ingestion.  These foods should be avoided to prevent symptoms.     -Continue famotidine  -We will place an ENT referral for recurrent tonsil stones  Follow-up in 2 to 3 months  I appreciate the opportunity to take part in Tajia's care. Please do not hesitate to contact me with questions.  Sincerely,   Prudy Feeler, MD Allergy/Immunology [facility]

## 2020-07-08 DIAGNOSIS — F331 Major depressive disorder, recurrent, moderate: Secondary | ICD-10-CM | POA: Diagnosis not present

## 2020-07-15 DIAGNOSIS — F331 Major depressive disorder, recurrent, moderate: Secondary | ICD-10-CM | POA: Diagnosis not present

## 2020-07-15 NOTE — Telephone Encounter (Signed)
Referral has been placed to [facility] ENT. Their office will review and give the patient a call to schedule. Patient has been informed to call us if she doesn't hear anything from their office by Friday.  Thanks

## 2020-07-16 DIAGNOSIS — G4733 Obstructive sleep apnea (adult) (pediatric): Secondary | ICD-10-CM | POA: Diagnosis not present

## 2020-07-16 DIAGNOSIS — F3281 Premenstrual dysphoric disorder: Secondary | ICD-10-CM | POA: Diagnosis not present

## 2020-07-16 DIAGNOSIS — N921 Excessive and frequent menstruation with irregular cycle: Secondary | ICD-10-CM | POA: Diagnosis not present

## 2020-07-17 DIAGNOSIS — I10 Essential (primary) hypertension: Secondary | ICD-10-CM | POA: Diagnosis not present

## 2020-07-17 DIAGNOSIS — Z6841 Body Mass Index (BMI) 40.0 and over, adult: Secondary | ICD-10-CM | POA: Diagnosis not present

## 2020-07-17 DIAGNOSIS — R600 Localized edema: Secondary | ICD-10-CM | POA: Diagnosis not present

## 2020-07-22 ENCOUNTER — Emergency Department
Admission: EM | Admit: 2020-07-22 | Discharge: 2020-07-22 | Discharge status: Against Medical Advice | Payer: Federal, State, Local not specified - PPO

## 2020-07-22 ENCOUNTER — Ambulatory Visit
Admission: EM | Admit: 2020-07-22 | Discharge: 2020-07-22 | Disposition: A | Discharge status: Home / Self Care | Payer: Federal, State, Local not specified - PPO

## 2020-07-22 DIAGNOSIS — R079 Chest pain, unspecified: Secondary | ICD-10-CM | POA: Diagnosis not present

## 2020-07-22 DIAGNOSIS — R21 Rash and other nonspecific skin eruption: Secondary | ICD-10-CM | POA: Diagnosis not present

## 2020-07-22 DIAGNOSIS — R0789 Other chest pain: Secondary | ICD-10-CM | POA: Diagnosis not present

## 2020-07-22 DIAGNOSIS — Z91041 Radiographic dye allergy status: Secondary | ICD-10-CM | POA: Diagnosis not present

## 2020-07-22 DIAGNOSIS — M62831 Muscle spasm of calf: Secondary | ICD-10-CM | POA: Diagnosis not present

## 2020-07-22 DIAGNOSIS — M79661 Pain in right lower leg: Secondary | ICD-10-CM | POA: Diagnosis not present

## 2020-07-22 DIAGNOSIS — M79604 Pain in right leg: Secondary | ICD-10-CM | POA: Diagnosis not present

## 2020-07-22 DIAGNOSIS — M79605 Pain in left leg: Secondary | ICD-10-CM | POA: Diagnosis not present

## 2020-07-22 DIAGNOSIS — K900 Celiac disease: Secondary | ICD-10-CM | POA: Diagnosis not present

## 2020-07-22 DIAGNOSIS — Z79899 Other long term (current) drug therapy: Secondary | ICD-10-CM | POA: Diagnosis not present

## 2020-07-22 DIAGNOSIS — I471 Supraventricular tachycardia: Secondary | ICD-10-CM | POA: Diagnosis not present

## 2020-07-22 NOTE — ED Provider Notes (Signed)
Brenda Ross    CSN: [id number] Arrival date & time: 07/22/20  1552      History   Chief Complaint Chief Complaint  Patient presents with  . Rash    HPI Brenda QUIZHPI is a 44 y.o. female.   Patient presents with right calf and thigh "blotching" and pain today.  She is worried about a blood clot.  She also reports enlarged right calf compared to left.  She denies redness, warmth, wounds.  No known injury.  She denies numbness, weakness, paresthesias.  She denies current chest pain or shortness of breath but does report having chest pain over the weekend.  The history is provided by the patient.    Past Medical History:  Diagnosis Date  . Allergic rhinitis   . Anemia   . Anxiety   . GERD (gastroesophageal reflux disease)   . Herpes genitalia   . Prediabetes   . Sleep apnea     Patient Active Problem List   Diagnosis Date Noted  . Swollen lymph nodes 04/10/2020  . Lower extremity edema 08/16/2019  . Genital herpes simplex 10/17/2018  . Essential hypertension 09/01/2018  . Avitaminosis D 09/01/2018  . Morbid obesity ([place]) 09/01/2018  . Palpitations 09/01/2018  . GERD (gastroesophageal reflux disease)   . Prediabetes   . Sleep apnea   . Anxiety   . Anemia   . Allergic rhinitis   . Abnormal nuclear stress test 04/26/2017  . DOE (dyspnea on exertion) 04/07/2017  . Borderline hypertension 09/17/2016  . Chest pain 09/17/2016  . Family history of catecholaminergic polymorphic ventricular tachycardia (CPVT) 07/08/2016  . IGT (impaired glucose tolerance) 07/08/2016  . Near syncope 02/19/2014    Past Surgical History:  Procedure Laterality Date  . APPENDECTOMY      OB History   No obstetric history on file.      Home Medications    Prior to Admission medications   Medication Sig Start Date End Date Taking? Authorizing Provider  cetirizine (ZYRTEC) 10 MG tablet Take 10 mg by mouth daily.    [provider]  dexlansoprazole (DEXILANT)  60 MG capsule Take 1 capsule (60 mg total) by mouth daily. 06/02/20   Lucilla Lame, MD  diltiazem (TIAZAC) 120 MG 24 hr capsule Take 120 mg by mouth daily. 10/08/19   [provider]  EPINEPHrine 0.3 mg/0.3 mL IJ SOAJ injection Inject 0.3 mLs (0.3 mg total) into the muscle as needed for anaphylaxis. 02/18/20   Virginia Crews, MD  famotidine (PEPCID) 40 MG tablet  10/08/19   [provider]  losartan (COZAAR) 100 MG tablet Take 1 tablet by mouth daily. 07/30/18   [provider]  montelukast (SINGULAIR) 10 MG tablet Take 1 tablet (10 mg total) by mouth daily. 05/16/20   Virginia Crews, MD  pantoprazole (PROTONIX) 40 MG tablet Take 1 tablet by mouth 2 (two) times daily. 05/16/18   [provider]    Family History Family History  Problem Relation Age of Onset  . Diabetes Mother   . Hypertension Mother   . Heart disease Mother   . Allergic rhinitis Mother   . Hypertension Father   . Heart disease Father   . Prostate cancer Father   . Diabetes Father   . Diabetes Brother   . ADD / ADHD Brother   . Asthma Brother   . Allergic rhinitis Brother   . ADD / ADHD Brother   . Allergic rhinitis Brother   . Allergic rhinitis  Brother   . Asthma Maternal Grandmother   . Breast cancer Neg Hx   . Colon cancer Neg Hx   . Ovarian cancer Neg Hx     Social History Social History   Tobacco Use  . Smoking status: Former Smoker    Packs/day: 1.00    Years: 3.00    Pack years: 3.00    Types: Cigarettes    Quit date: 01/14/1996    Years since quitting: 24.5  . Smokeless tobacco: Never Used  Vaping Use  . Vaping Use: Never used  Substance Use Topics  . Alcohol use: Yes    Alcohol/week: 7.0 - 14.0 standard drinks    Types: 7 - 14 Glasses of wine per week  . Drug use: Never     Allergies   Iodinated diagnostic agents, Eggs or egg-derived products, Gluten meal, and Pea   Review of Systems Review of Systems  Constitutional: Negative for chills and  fever.  HENT: Negative for ear pain and sore throat.   Eyes: Negative for pain and visual disturbance.  Respiratory: Negative for cough and shortness of breath.   Cardiovascular: Negative for chest pain and palpitations.  Gastrointestinal: Negative for abdominal pain and vomiting.  Genitourinary: Negative for dysuria and hematuria.  Musculoskeletal: Positive for myalgias. Negative for arthralgias and back pain.  Skin: Positive for color change. Negative for rash.  Neurological: Negative for seizures, syncope, weakness and numbness.  All other systems reviewed and are negative.    Physical Exam Triage Vital Signs ED Triage Vitals  Enc Vitals Group     BP 07/22/20 1556 (!) 137/93     Pulse Rate 07/22/20 1556 83     Resp 07/22/20 1556 16     Temp 07/22/20 1556 99.2 F (37.3 C)     Temp Source 07/22/20 1556 Oral     SpO2 07/22/20 1556 96 %     Weight --      Height --      Head Circumference --      Peak Flow --      Pain Score 07/22/20 1559 0     Pain Loc --      Pain Edu? --      Excl. in [place]? --    No data found.  Updated Vital Signs BP (!) 137/93 (BP Location: Right Arm)   Pulse 83   Temp 99.2 F (37.3 C) (Oral)   Resp 16   SpO2 96%   Visual Acuity Right Eye Distance:   Left Eye Distance:   Bilateral Distance:    Right Eye Near:   Left Eye Near:    Bilateral Near:     Physical Exam Vitals and nursing note reviewed.  Constitutional:      General: She is not in acute distress.    Appearance: She is well-developed. She is obese. She is not ill-appearing.  HENT:     Head: Normocephalic and atraumatic.     Mouth/Throat:     Mouth: Mucous membranes are moist.  Eyes:     Conjunctiva/sclera: Conjunctivae normal.  Cardiovascular:     Rate and Rhythm: Normal rate and regular rhythm.     Heart sounds: No murmur heard.   Pulmonary:     Effort: Pulmonary effort is normal. No respiratory distress.     Breath sounds: Normal breath sounds. No wheezing, rhonchi or  rales.  Abdominal:     Palpations: Abdomen is soft.     Tenderness: There is no abdominal tenderness.  Musculoskeletal:        General: No deformity. Normal range of motion.     Cervical back: Neck supple.     Comments: Right calf larger than left. Mild generalized tenderness to palpation.   Skin:    General: Skin is warm and dry.     Findings: No bruising, erythema, lesion or rash.     Comments: No open wounds.    Neurological:     General: No focal deficit present.     Mental Status: She is alert and oriented to person, place, and time.     Sensory: No sensory deficit.     Motor: No weakness.     Gait: Gait normal.  Psychiatric:        Mood and Affect: Mood normal.        Behavior: Behavior normal.      UC Treatments / Results  Labs (all labs ordered are listed, but only abnormal results are displayed) Labs Reviewed - No data to display  EKG   Radiology No results found.  Procedures Procedures (including critical care time)  Medications Ordered in UC Medications - No data to display  Initial Impression / Assessment and Plan / UC Course  I have reviewed the triage vital signs and the nursing notes.  Pertinent labs & imaging results that were available during my care of the patient were reviewed by me and considered in my medical decision making (see chart for details).   Right calf pain.  Sending patient to the emergency department for evaluation due to her calf pain and report of chest pain several days ago.  Patient agrees to plan of care.  Patient declines EMS and states she is stable to transport POV.   Final Clinical Impressions(s) / UC Diagnoses   Final diagnoses:  Right calf pain     Discharge Instructions     Go to the emergency department for evaluation of your right lower leg pain.    ED Prescriptions    None     PDMP not reviewed this encounter.   Sharion Balloon, NP 07/22/20 [phone]

## 2020-07-22 NOTE — ED Triage Notes (Signed)
Pt presents to UC for rash on right upper leg that has been present since today. Pt concerned for blood clot. RN counsled pt that UC does not have ultrasound/stat labs pt still wants to be seen and evaluated.   Rash appears to be red/blothcy on back of right thigh. Pt denies itching. Pt endorsing pain at site. Pt denies warmth.   Pt denies recent travel, endorses sitting for long periods of time, mirenea for [facility], denies smoking.

## 2020-07-22 NOTE — Discharge Instructions (Addendum)
Go to the emergency department for evaluation of your right lower leg pain.

## 2020-07-24 ENCOUNTER — Ambulatory Visit: Payer: Federal, State, Local not specified - PPO | Admitting: Physician Assistant

## 2020-07-24 ENCOUNTER — Encounter: Payer: Self-pay | Admitting: Physician Assistant

## 2020-07-24 ENCOUNTER — Other Ambulatory Visit: Payer: Self-pay

## 2020-07-24 VITALS — BP 111/73 | HR 63 | Temp 98.6°F | Ht 64.5 in | Wt 284.6 lb

## 2020-07-24 DIAGNOSIS — G4489 Other headache syndrome: Secondary | ICD-10-CM

## 2020-07-24 DIAGNOSIS — H538 Other visual disturbances: Secondary | ICD-10-CM

## 2020-07-24 DIAGNOSIS — H6983 Other specified disorders of Eustachian tube, bilateral: Secondary | ICD-10-CM

## 2020-07-24 DIAGNOSIS — R4781 Slurred speech: Secondary | ICD-10-CM

## 2020-07-24 DIAGNOSIS — L819 Disorder of pigmentation, unspecified: Secondary | ICD-10-CM

## 2020-07-24 DIAGNOSIS — R2241 Localized swelling, mass and lump, right lower limb: Secondary | ICD-10-CM

## 2020-07-24 DIAGNOSIS — N3000 Acute cystitis without hematuria: Secondary | ICD-10-CM

## 2020-07-24 DIAGNOSIS — K900 Celiac disease: Secondary | ICD-10-CM

## 2020-07-24 DIAGNOSIS — R4789 Other speech disturbances: Secondary | ICD-10-CM | POA: Diagnosis not present

## 2020-07-24 DIAGNOSIS — M79604 Pain in right leg: Secondary | ICD-10-CM

## 2020-07-24 MED ORDER — CEPHALEXIN 500 MG PO CAPS: 500.0000 mg | ORAL_CAPSULE | Freq: Two times a day (BID) | ORAL | 0 refills | Status: DC

## 2020-07-24 NOTE — Progress Notes (Signed)
Established patient visit   Patient: Brenda Ross   DOB: 12-Jun-1976   44 y.o. Female  MRN: [id number] Visit Date: 07/24/2020  Today's healthcare provider: Mar Daring, PA-C   Chief Complaint  Patient presents with  . ER Follow up   Mertie Moores as a scribe for Mar Daring, PA-C.,have documented all relevant documentation on the behalf of MALAY FANTROY, PA-C,as directed by  Mar Daring, PA-C while in the presence of Mar Daring, PA-C.  Subjective    HPI  Follow up ER visit  Patient was seen in ER for right leg pain on 07/22/2020. She was treated for rash. Treatment for this included [place] to rule out DVT,EKG and labs. She reports good compliance with treatment. She reports this condition is slightly improved. Patient is requesting a MRI. She does have a Cardio follow up on 07/25/20.   She is having slurred speech, difficulty with word finding, headaches, and right leg pain. There is family history of TIA and strokes.   Does have celiac disease and has had a mottled rash on the right lower extremity that she went to the ER for. Reports they had checked her for DVT, but that the MD did it at bedside as the [place] tech had left for the night. Still has mottled, pink rash with mild swelling in the mid calf of the right leg with some tenderness. Pain has been improving. Would like a formal US.  Also wanting to discuss labs and urine culture from ER. Had not been having symptoms of UTI but now having some burning and possibly some mild hematuria with urination. Had borderline elevated leukocytes and neutrophils on CBC. Urine culture was positive for group b hemolytic step.  Also having pain around the left ear. Comes and goes. Having muffled hearing. -----------------------------------------------------------------------------------------  Patient Active Problem List   Diagnosis Date Noted  . Swollen lymph nodes 04/10/2020  . Lower  extremity edema 08/16/2019  . Genital herpes simplex 10/17/2018  . Essential hypertension 09/01/2018  . Avitaminosis D 09/01/2018  . Morbid obesity ([place]) 09/01/2018  . Palpitations 09/01/2018  . GERD (gastroesophageal reflux disease)   . Prediabetes   . Sleep apnea   . Anxiety   . Anemia   . Allergic rhinitis   . Abnormal nuclear stress test 04/26/2017  . DOE (dyspnea on exertion) 04/07/2017  . Borderline hypertension 09/17/2016  . Chest pain 09/17/2016  . Family history of catecholaminergic polymorphic ventricular tachycardia (CPVT) 07/08/2016  . IGT (impaired glucose tolerance) 07/08/2016  . Near syncope 02/19/2014   Past Medical History:  Diagnosis Date  . Allergic rhinitis   . Anemia   . Anxiety   . GERD (gastroesophageal reflux disease)   . Herpes genitalia   . Prediabetes   . Sleep apnea        Medications: Outpatient Medications Prior to Visit  Medication Sig  . cetirizine (ZYRTEC) 10 MG tablet Take 10 mg by mouth daily.  [name] Kitchen diltiazem (TIAZAC) 120 MG 24 hr capsule Take 120 mg by mouth daily.  [name] Kitchen EPINEPHrine 0.3 mg/0.3 mL IJ SOAJ injection Inject 0.3 mLs (0.3 mg total) into the muscle as needed for anaphylaxis.  . famotidine (PEPCID) 40 MG tablet   . losartan (COZAAR) 100 MG tablet Take 1 tablet by mouth daily.  . montelukast (SINGULAIR) 10 MG tablet Take 1 tablet (10 mg total) by mouth daily.  . pantoprazole (PROTONIX) 40 MG tablet Take 1 tablet by mouth 2 (two) times  daily.  . [DISCONTINUED] dexlansoprazole (DEXILANT) 60 MG capsule Take 1 capsule (60 mg total) by mouth daily.   No facility-administered medications prior to visit.    Review of Systems  Constitutional: Negative.   Eyes: Positive for visual disturbance (blurred).  Respiratory: Negative.   Cardiovascular: Positive for leg swelling. Negative for chest pain and palpitations.  Musculoskeletal:       Right leg pain   Skin: Positive for color change.  Neurological: Positive for speech difficulty  and headaches.    Last CBC Lab Results  Component Value Date   WBC 7.7 04/10/2020   HGB 11.5 04/10/2020   HCT 35.1 04/10/2020   MCV 82 04/10/2020   MCH 27.0 04/10/2020   RDW 13.5 04/10/2020   PLT 257 41/32/4401   Last metabolic panel Lab Results  Component Value Date   GLUCOSE 105 (H) 08/06/2019   NA 141 08/06/2019   K 4.2 08/06/2019   CL 104 08/06/2019   CO2 21 08/06/2019   BUN 11 08/06/2019   CREATININE 0.80 08/06/2019   GFRNONAA 90 08/06/2019   GFRAA 104 08/06/2019   CALCIUM 9.2 08/06/2019   PROT 7.0 12/21/2019   ALBUMIN 4.4 12/21/2019   LABGLOB 2.5 08/06/2019   AGRATIO 1.7 08/06/2019   BILITOT 0.5 12/21/2019   ALKPHOS 81 12/21/2019   AST 17 12/21/2019   ALT 24 12/21/2019   ANIONGAP 8 10/18/2018   Last lipids Lab Results  Component Value Date   CHOL 138 09/08/2018   HDL 49 09/08/2018   LDLCALC 73 09/08/2018   TRIG 81 09/08/2018   CHOLHDL 2.8 09/08/2018      Objective    BP 111/73 (BP Location: Left Arm, Patient Position: Sitting, Cuff Size: Large)   Pulse 63   Temp 98.6 F (37 C) (Oral)   Ht 5' 4.5" (1.638 m)   Wt 284 lb 9.6 oz (129.1 kg)   BMI 48.10 kg/m  BP Readings from Last 3 Encounters:  07/24/20 111/73  07/22/20 (!) 137/93  07/04/20 124/72   Wt Readings from Last 3 Encounters:  07/24/20 284 lb 9.6 oz (129.1 kg)  07/04/20 (!) 285 lb 12.8 oz (129.6 kg)  05/15/20 285 lb (129.3 kg)      Physical Exam Vitals reviewed.  Constitutional:      General: She is not in acute distress.    Appearance: Normal appearance. She is well-developed. She is obese. She is not ill-appearing or diaphoretic.  HENT:     Head: Normocephalic and atraumatic.     Right Ear: Hearing, ear canal and external ear normal. No decreased hearing noted. No drainage, swelling or tenderness. A middle ear effusion (clear with air bubbles around 10 o'clock) is present. There is no impacted cerumen. No mastoid tenderness. Tympanic membrane is not injected, scarred, perforated  or erythematous.     Left Ear: Hearing, ear canal and external ear normal. No decreased hearing noted. No drainage, swelling or tenderness. A middle ear effusion (clear with air bubbles around 4-5 o'clock) is present. There is no impacted cerumen. No mastoid tenderness. Tympanic membrane is not injected, scarred, perforated or erythematous.     Mouth/Throat:     Pharynx: Uvula midline.  Eyes:     General: No scleral icterus.       Right eye: No discharge.        Left eye: No discharge.     Extraocular Movements: Extraocular movements intact.     Conjunctiva/sclera: Conjunctivae normal.     Pupils: Pupils are equal, round,  and reactive to light.  Neck:     Thyroid: No thyromegaly.     Trachea: No tracheal deviation.  Cardiovascular:     Rate and Rhythm: Normal rate and regular rhythm.     Pulses: Normal pulses.     Heart sounds: Normal heart sounds. No murmur heard.  No friction rub. No gallop.   Pulmonary:     Effort: Pulmonary effort is normal. No respiratory distress.     Breath sounds: Normal breath sounds. No stridor. No wheezing or rales.  Musculoskeletal:     Cervical back: Normal range of motion and neck supple.     Right lower leg: Edema present.     Left lower leg: No edema.  Lymphadenopathy:     Cervical: No cervical adenopathy.  Skin:    General: Skin is warm and dry.  Neurological:     General: No focal deficit present.     Mental Status: She is alert. Mental status is at baseline.     Motor: No weakness.     Gait: Gait normal.  Psychiatric:        Behavior: Behavior is cooperative.       No results found for any visits on 07/24/20.  Assessment & Plan     1. Other headache syndrome Will order brain MRI at [facility] for patient to have done in an open MRI. Having some mild neurological symptoms with headache, blurred vision, slurring of speech and difficulty with word finding or misspeaking. Will f/u pending MRI results. R/O MS or TIA/Stroke (does have  strong family history of TIA/strokes). - MR Brain W Wo Contrast; Future  2. Slurred speech Intermittent, husband has witnessed it. See above medical treatment plan. - MR Brain W Wo Contrast; Future  3. Word finding difficulty See above medical treatment plan. - MR Brain W Wo Contrast; Future  4. Blurred vision, bilateral See above medical treatment plan. - MR Brain W Wo Contrast; Future  5. Pain of right lower extremity See above medical treatment plan. R/O MS.  - MR Brain W Wo Contrast; Future  6. Acute cystitis without hematuria Noted from urine culture. Will treat with Keflex as below.  - cephALEXin (KEFLEX) 500 MG capsule; Take 1 capsule (500 mg total) by mouth 2 (two) times daily.  Dispense: 14 capsule; Refill: 0  7. Mottled skin Noted on lateral right lower extremity. Already has f/u with cardiology scheduled for vascular issues. Feet are warm and pedal pulses palpable. She does have celiac, so I wonder if there may be another autoimmune disorder like anti-phospholipid syndrome. Referral to Rheumatology placed for further evaluation. Consult appreciated. New rigth lower extremity US ordered for patient to have formal imaging since no improvement in symptoms.  - Ambulatory referral to Rheumatology - US Venous Img Lower Unilateral Right; Future  8. Celiac disease See above medical treatment plan. - Ambulatory referral to Rheumatology  9. Localized swelling of right lower extremity See above medical treatment plan. - US Venous Img Lower Unilateral Right; Future  10. Dysfunction of both eustachian tubes Discussed adding flonase sensimist for allergies and serous fluid in ears.   I spent approximately 50 minutes with the patient today. Over 50% of this time was spent with counseling and educating the patient.   No follow-ups on file.      Reynolds Bowl, PA-C, have reviewed all documentation for this visit. The documentation on 07/24/20 for the exam, diagnosis,  procedures, and orders are all accurate and complete.  [place]ye [place]  [facility] [phone] (phone) [phone] (fax)  [place]

## 2020-07-25 ENCOUNTER — Telehealth: Payer: Self-pay | Admitting: Family Medicine

## 2020-07-25 DIAGNOSIS — Z6841 Body Mass Index (BMI) 40.0 and over, adult: Secondary | ICD-10-CM | POA: Insufficient documentation

## 2020-07-25 DIAGNOSIS — R600 Localized edema: Secondary | ICD-10-CM | POA: Diagnosis not present

## 2020-07-25 IMAGING — CT CT PELVIS W/ CM
2 of 3 series · 16 of 46 positions shown, 18 images · IV contrast (APPLIED)
Comparison: None.

CLINICAL DATA: Body aches, headaches, fatigue, chills, fever,
dizziness, diarrhea, cramps. Recent diagnosis of herpes and
abscesses.

EXAM:
CT PELVIS WITH CONTRAST
TECHNIQUE: Multidetector CT imaging of the pelvis was performed using the
standard protocol following the bolus administration of intravenous
contrast.
CONTRAST:  100mL VB09PY-RSS IOPAMIDOL (VB09PY-RSS) INJECTION 61%

[Series 2: routine abd/pel with · axial · 0.98mm/px · z∈[-455,-170]mm · 13 of 67 slices shown, 15 images]
[im 5/67  soft-tissue]
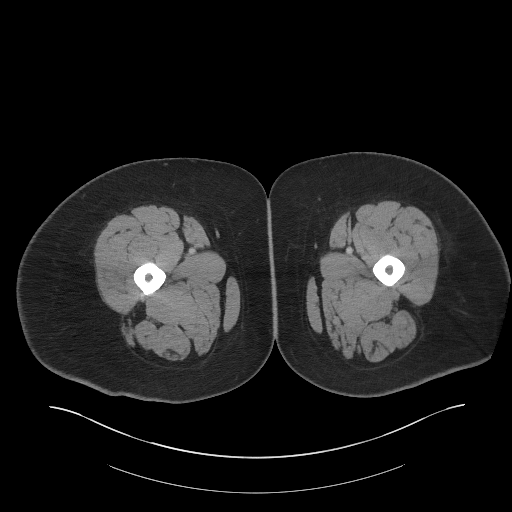
[im 5/67  bone]
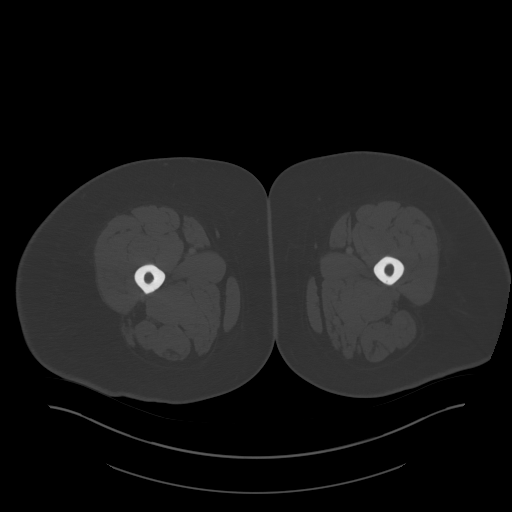
[im 9/67  soft-tissue]
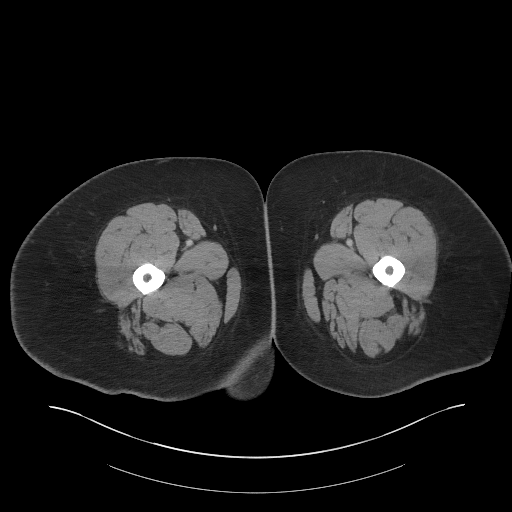
[im 13/67  soft-tissue]
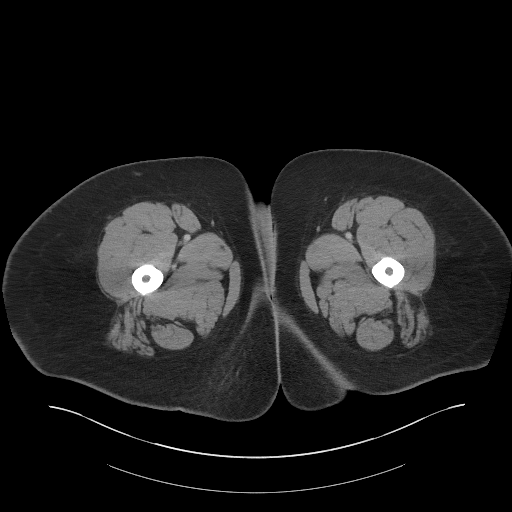
[im 20/67  soft-tissue]
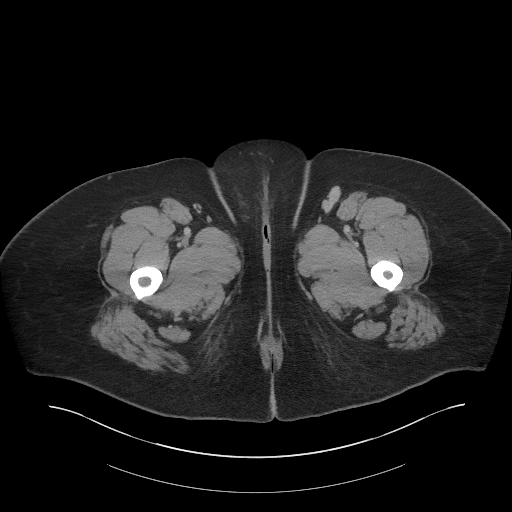
[im 24/67  soft-tissue]
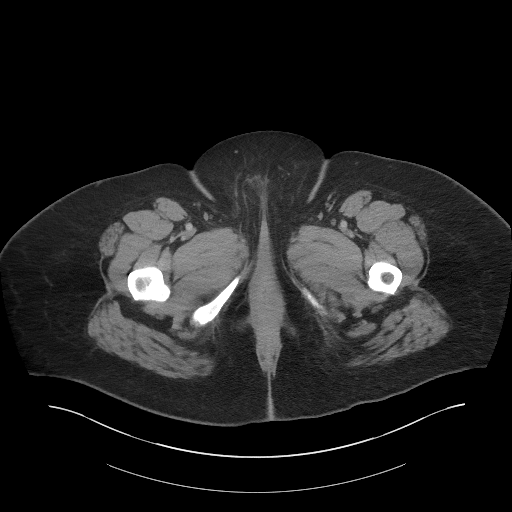
[im 28/67  soft-tissue]
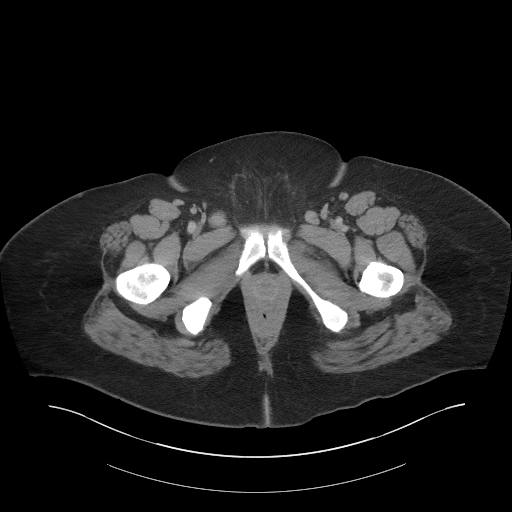
[im 35/67  soft-tissue]
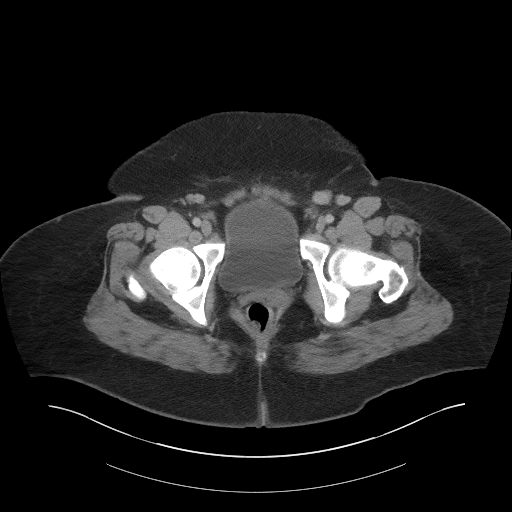
[im 39/67  soft-tissue]
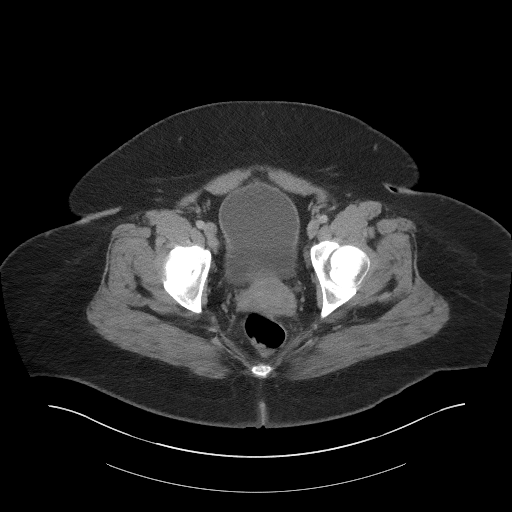
[im 43/67  soft-tissue]
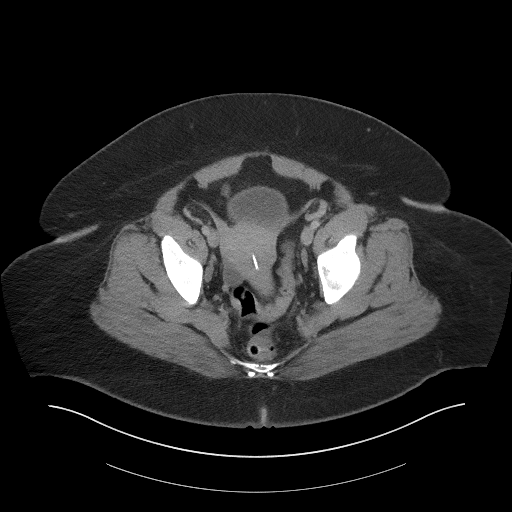
[im 43/67  bone]
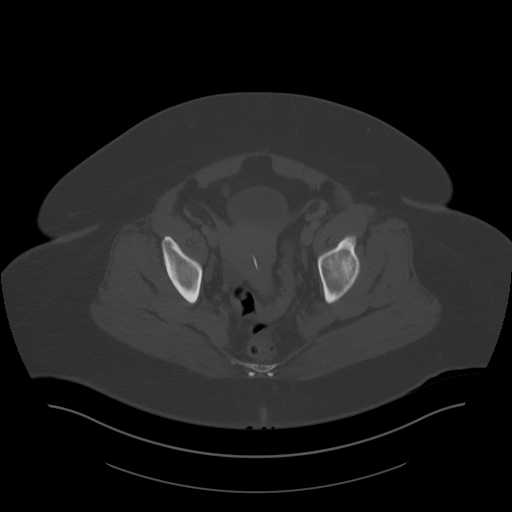
[im 47/67  soft-tissue]
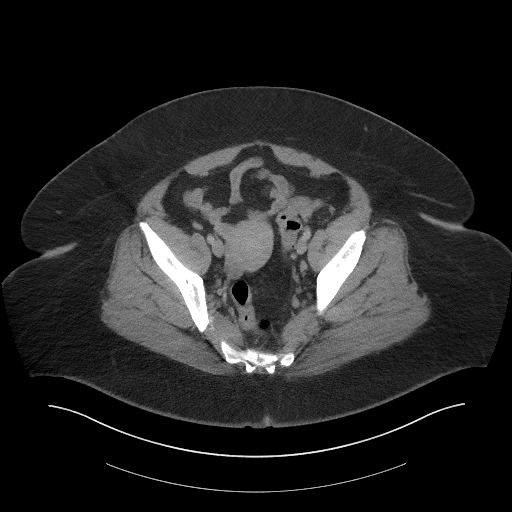
[im 54/67  soft-tissue]
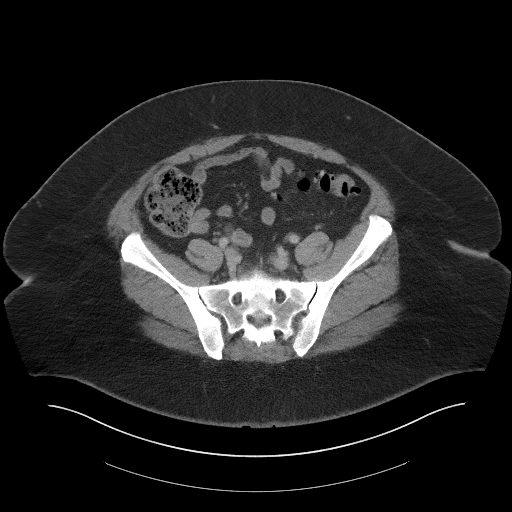
[im 58/67  soft-tissue]
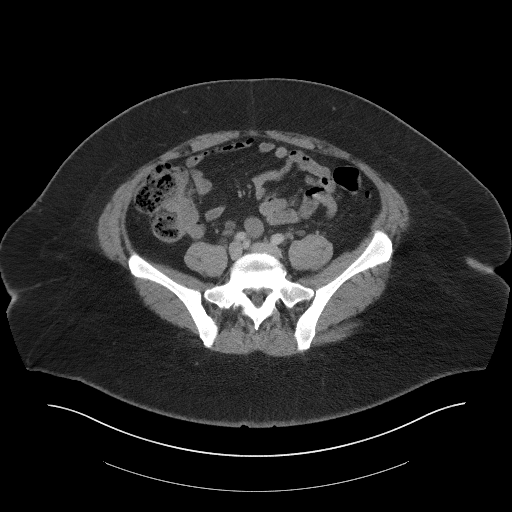
[im 62/67  soft-tissue]
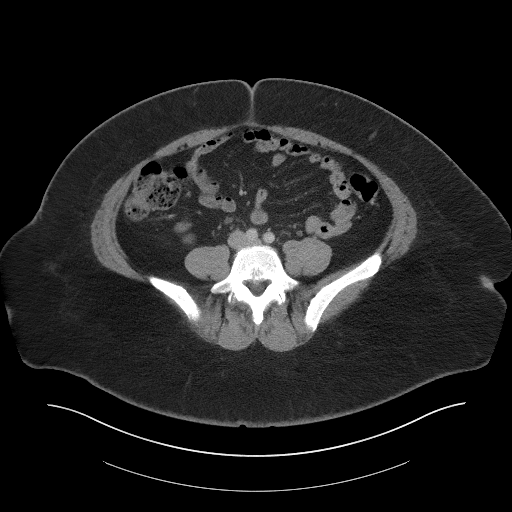

[Series 4: coronal st · coronal · 0.71mm/px · 3 of 107 slices shown]
[im 36/107  soft-tissue]
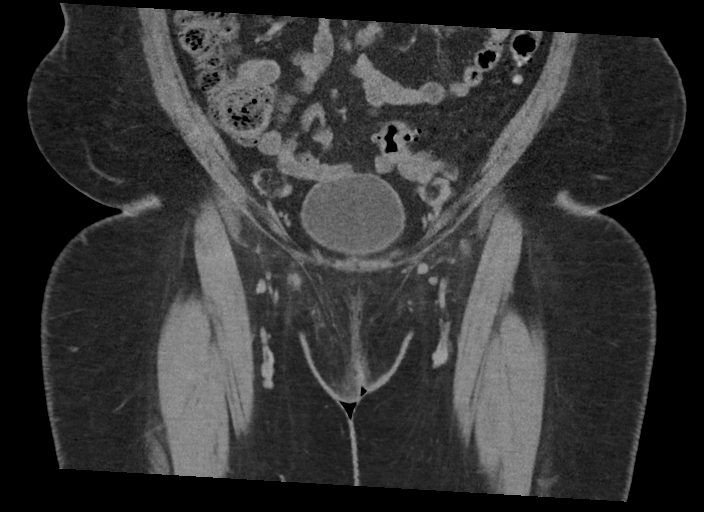
[im 48/107  soft-tissue]
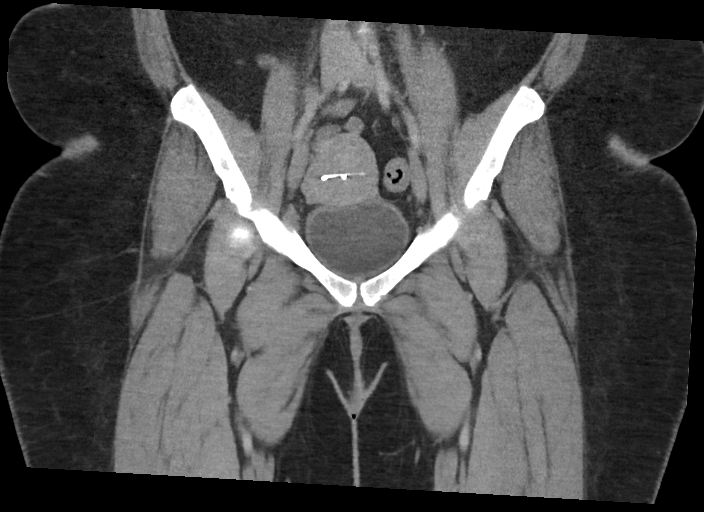
[im 59/107  soft-tissue]
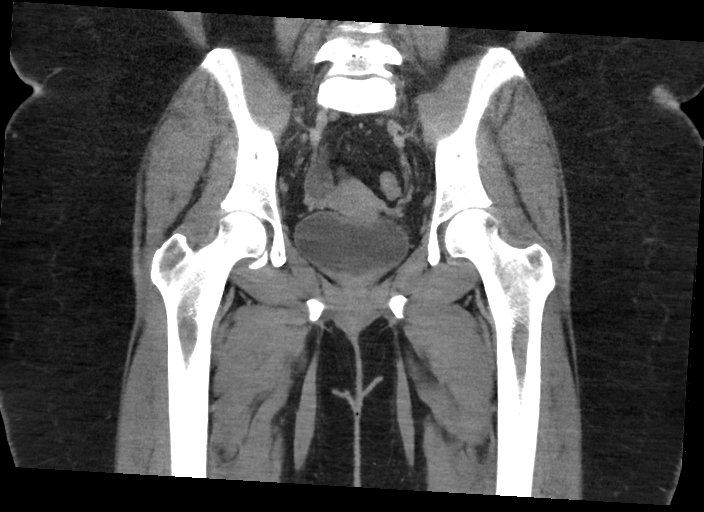

[16 of 46 positions shown; findings below may reference images not displayed]

FINDINGS: Urinary Tract:  No bladder wall thickening or filling defects.

Bowel: Visualized small and large bowel are not abnormally
distended. Scattered diverticula in the sigmoid colon. No evidence
of diverticulitis.

Vascular/Lymphatic: No aneurysm or significant vascular
calcifications.

Reproductive: Uterus and ovaries are not enlarged. An intrauterine
device is present centrally in the uterus.

Other: No free air or free fluid in the pelvis. Pelvic and groin
lymph nodes are enlarged with right groin lymph nodes measuring up
to 1.8 cm diameter. This appearance is nonspecific, but in the
setting of infection, likely represents reactive lymphadenopathy.
Follow-up as clinically indicated. No soft tissue abscess or
infiltration identified.

Musculoskeletal: No suspicious bone lesions identified.
IMPRESSION: Nonspecific enlarged pelvic and groin lymph nodes, likely reactive
in the setting of an infectious process. No soft tissue abscess or
infiltration identified.

## 2020-07-25 NOTE — Telephone Encounter (Signed)
There were no reasons for STAT. No urgent signs or symptoms. I know it is inconvenient, but will be done ASAP.

## 2020-07-25 NOTE — Telephone Encounter (Signed)
Will forward to [place] who ordered

## 2020-07-25 NOTE — Telephone Encounter (Signed)
Pt is asking that you change her ultrasound and MRI to stat. She was under the impression that she would get in for this ASAP

## 2020-07-25 NOTE — Telephone Encounter (Signed)
Patient advised.

## 2020-07-28 ENCOUNTER — Encounter: Payer: Self-pay | Admitting: Physician Assistant

## 2020-07-29 ENCOUNTER — Ambulatory Visit: Payer: Federal, State, Local not specified - PPO

## 2020-07-29 DIAGNOSIS — F331 Major depressive disorder, recurrent, moderate: Secondary | ICD-10-CM | POA: Diagnosis not present

## 2020-07-30 ENCOUNTER — Ambulatory Visit: Payer: Federal, State, Local not specified - PPO

## 2020-07-30 IMAGING — CR DG ABDOMEN 1V
1 series · 2 of 2 positions shown · non-contrast
Comparison: CT scan of the abdomen and pelvis of October 18, 2018

CLINICAL DATA: [place] muscles, dark urine, left flank pain, decreased
urination since and allergic reaction to CT contrast last week.

EXAM:
ABDOMEN - 1 VIEW

[Series 1: dg abd 1 view · 0.14mm/px · 2 of 2 slices shown]
[im 1/2]
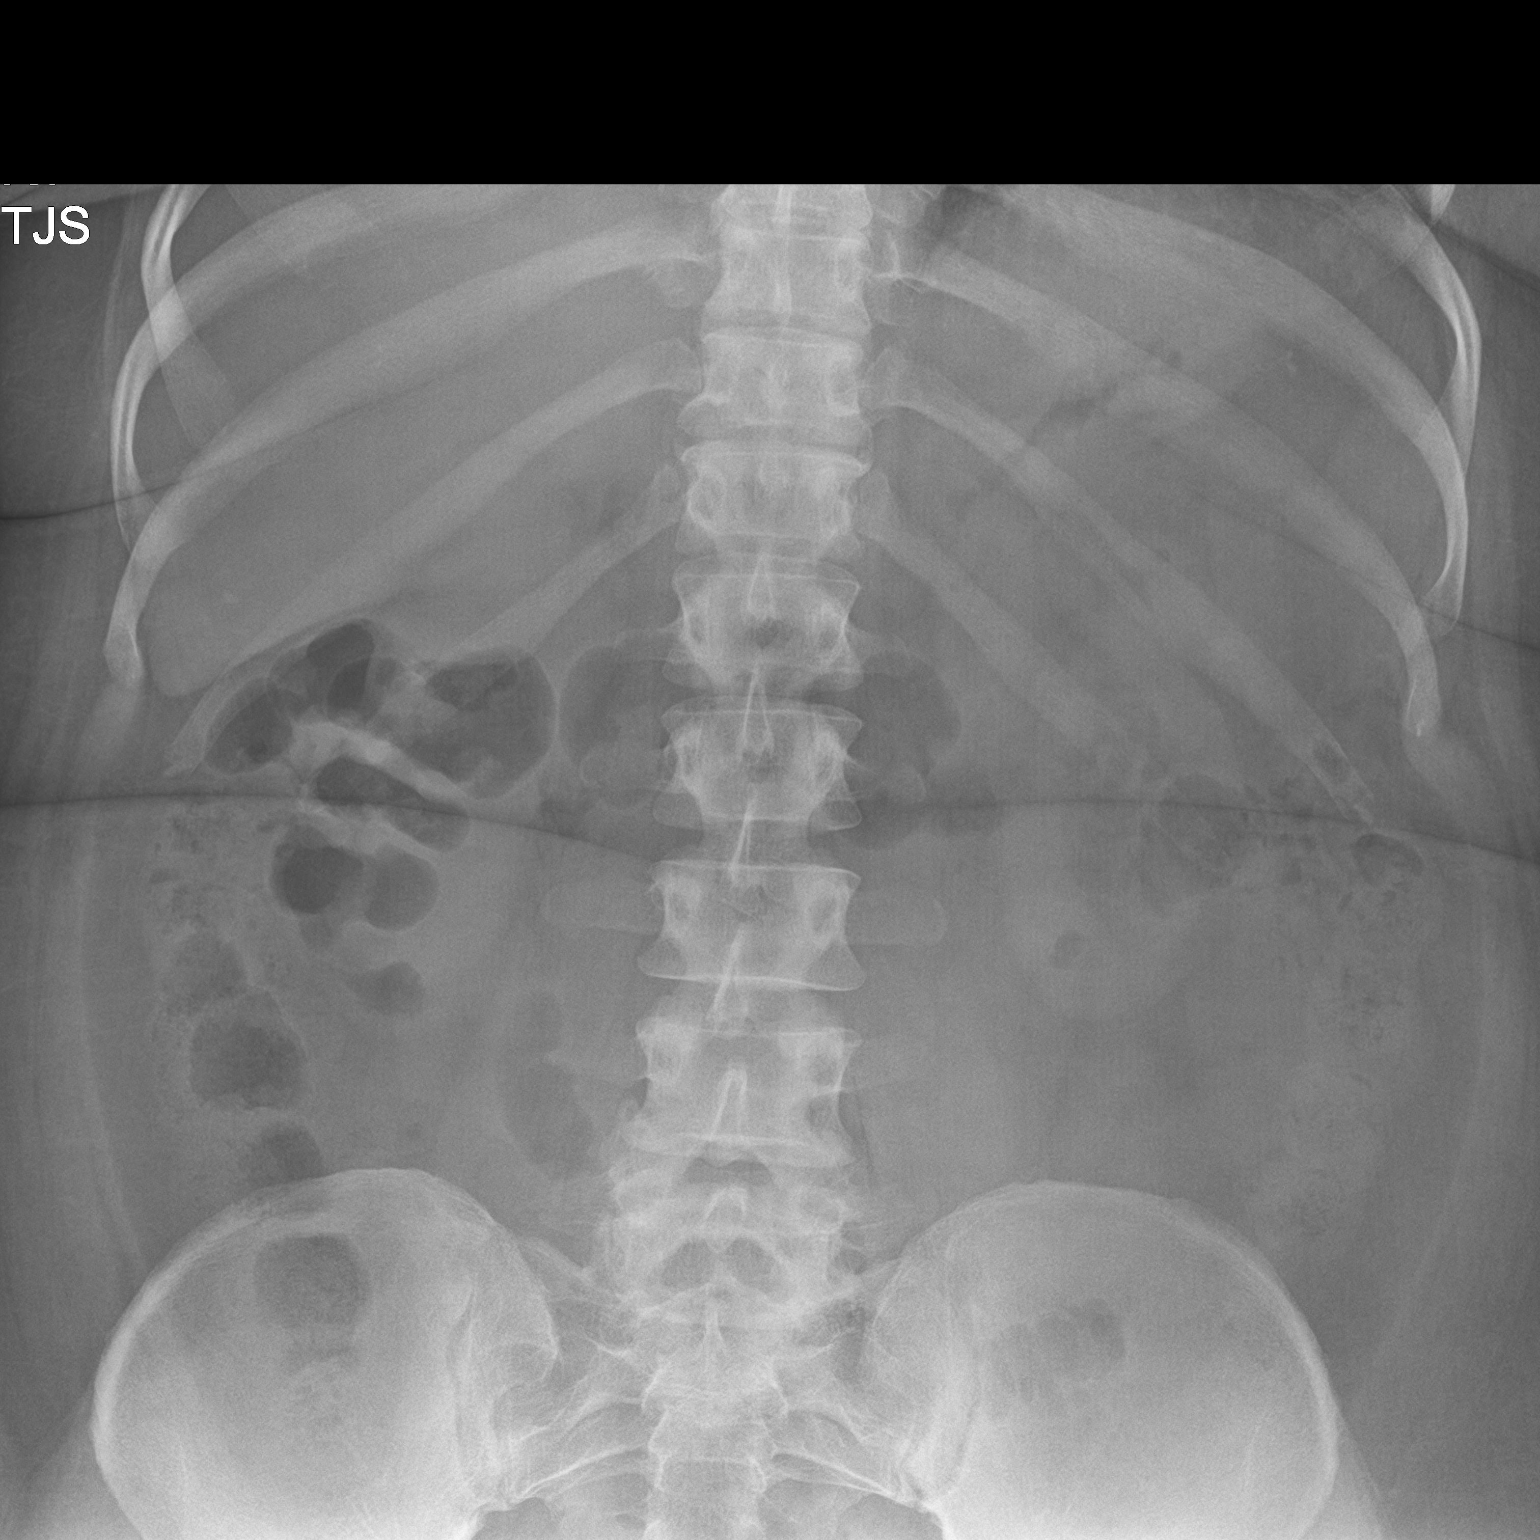
[im 2/2]
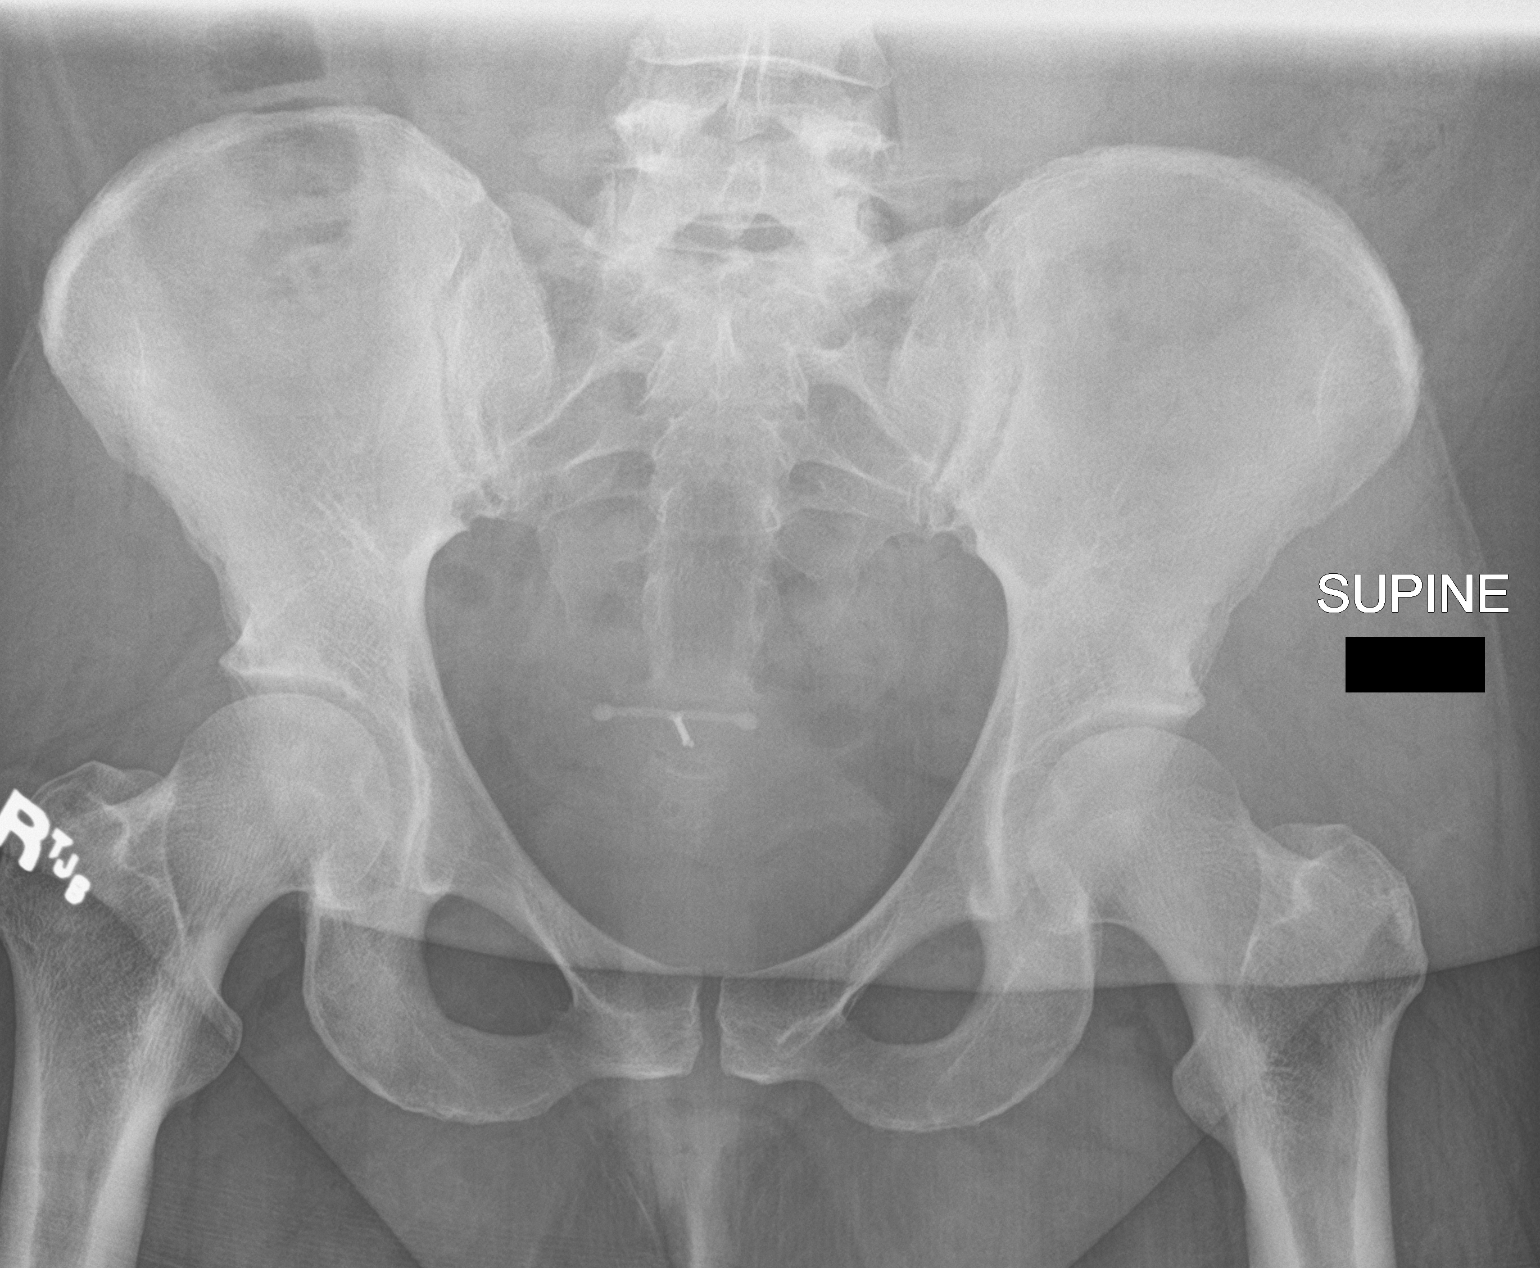

[2 of 2 positions shown; findings below may reference images not displayed]

FINDINGS: The bowel gas pattern is normal. No abnormal soft tissue
calcifications are observed. An IUD is present. The bony structures
exhibit no acute abnormalities.
IMPRESSION: No acute intra-abdominal abnormality is observed.

## 2020-08-01 ENCOUNTER — Telehealth: Payer: Self-pay | Admitting: Family Medicine

## 2020-08-01 DIAGNOSIS — Z91041 Radiographic dye allergy status: Secondary | ICD-10-CM

## 2020-08-01 MED ORDER — PREDNISONE 50 MG PO TABS: ORAL_TABLET | ORAL | 0 refills | Status: DC

## 2020-08-01 NOTE — Telephone Encounter (Signed)
Dr. Jacinto Reap,  I do not feel an MRI is medically necessary of the lower leg. She did have mottling of the right lower extremity and I have placed referrals. I also have ordered a brain MRI for her slurred speech episodes she reported.  You may want to re-evaluate her to see if this is medically necessary.

## 2020-08-01 NOTE — Addendum Note (Signed)
Addended by: Mar Daring on: 08/01/2020 06:32 PM   Modules accepted: Orders

## 2020-08-01 NOTE — Telephone Encounter (Signed)
Are you approving this MRI? KW

## 2020-08-01 NOTE — Telephone Encounter (Signed)
Patient reports that she wants further testing done whether it is MRI, CT or any other test that there is. She said the point of her contacting us is because she's following up because is worsening and more red.She states "not going to go to the  Emergency room, NO I'm not going and exposed myself to covid" " I am not happy with her answered about the MRI" "sounds like she just doesn't want to fill out paper work" "Ultrasound results are there. If she goes into epic she should be able to see them"

## 2020-08-01 NOTE — Telephone Encounter (Signed)
Pt is calling and would like mri /ct scan of leg . Pt would like a nurse to call her back today and to discuss allergy preparation. Pt is allergic to contact.  please call pt at [phone]. Pt is standing by her phone

## 2020-08-01 NOTE — Telephone Encounter (Signed)
See phone note on same issue

## 2020-08-01 NOTE — Telephone Encounter (Signed)
I am sorry. I cannot speed up the appointments as the rheumatologist have been short handed with providers.  If she is having severe pain she may require to be evaluated in the ER for urgent testing.

## 2020-08-01 NOTE — Telephone Encounter (Signed)
Unable to get a hold of patient, phne went to voicemail and voicemail box was full. KW

## 2020-08-01 NOTE — Telephone Encounter (Signed)
I am not sure what an MRI of the leg would show. Will await [place] results first. If she desires, can be re-evaluated by Dr. Brita Romp, her PCP.  MRI brain has been ordered. Will send in prednisone 68m. Take at 13 hours before MRI, then another at 7 hrs before MRI, and last one 1 hr before MRI.   Also take 552mbenadryl OTC 1 hour before MRI. Will need driver.

## 2020-08-01 NOTE — Telephone Encounter (Signed)
Patient is calling regarding her message she sent Anderson Malta on My Chart.  She stated that her leg is really hurting and she has not heard from doctor or nurse.  She would like the rheumatologist that she was referred to, to look at her leg as well and she said her appt. Is not until the end of September which she said is too long.  Please advise and call patient to discuss at [phone]

## 2020-08-04 ENCOUNTER — Encounter: Payer: Self-pay | Admitting: Family Medicine

## 2020-08-04 ENCOUNTER — Telehealth: Payer: Self-pay

## 2020-08-04 NOTE — Telephone Encounter (Signed)
Patient would like to come in and talk to you. Please advise where patient can be seen this week.

## 2020-08-04 NOTE — Telephone Encounter (Signed)
Faxed to [facility].

## 2020-08-04 NOTE — Telephone Encounter (Signed)
There are no available appointments.  Unfortunately, we are booked solid.  We can start a list to call if there are cancellations

## 2020-08-04 NOTE — Telephone Encounter (Signed)
Sounds like Tawanna Sat evaluated her and did not feel that MRI was medically necessary.  Depending on clinical picture, different images/studies can be helpful.  An MRI is not always what is needed.  She is welcome to be re-evaluated with an OV.

## 2020-08-04 NOTE — Telephone Encounter (Signed)
Copied from [place] [phone]. Topic: General - Other >> Aug 04, 2020 12:12 PM Leward Quan A wrote: Reason for CRM: Synetta Shadow with [facility] and order management team called to request order for ( MRI for Head Brain with and without contrast ) faxed over to Fax# [phone] Ph# [phone] Patient is scheduled for this Thursday 08/07/20

## 2020-08-04 NOTE — Telephone Encounter (Signed)
Ok to resend order to correct location

## 2020-08-05 DIAGNOSIS — F331 Major depressive disorder, recurrent, moderate: Secondary | ICD-10-CM | POA: Diagnosis not present

## 2020-08-05 NOTE — Telephone Encounter (Signed)
Appt scheduled for Friday. Patient reports she was called from Rheumatology for an appt. She will call to cancel if not needed after seeing Rheumatology.

## 2020-08-05 NOTE — Telephone Encounter (Signed)
Hi Brenda Ross, I need some more information about the visit with this patient.  I know we are ruling out DVT and sending her to rheumatology to rule out things like antiphospholipid syndrome as a cause of her rash.  Is there leg pain as well?  I trust your judgment about the MRI.  Any indication for referral to Ortho?  Without seeing it myself it is really difficult to make a call on it.  I also have no appointments left for the rest of the week.

## 2020-08-05 NOTE — Telephone Encounter (Signed)
So in office the pain was improving at the time from the pain she had in it when she went to the ER. It appears it must have returned per her messages. There was a localized area of a slightly darker than flesh tone mottling over the right lateral leg approx 6cm in diameter. No warmth or other findings. Pedal pulses were normal. No neuro deficits. No mass felt around the rash area.

## 2020-08-05 NOTE — Telephone Encounter (Signed)
Thanks [place].  You can see my response to her other message in her chart.  I'm not sure that will appease her, but I'm trying

## 2020-08-05 NOTE — Telephone Encounter (Signed)
Dr. Jacinto Reap  May benefit from a localized [place] over the area of concern. That has not been done. I am not sure how much benefit an MRI would give as I was more concerned it may be more autoimmune, which is why I placed the rheum referral for her.

## 2020-08-06 DIAGNOSIS — R21 Rash and other nonspecific skin eruption: Secondary | ICD-10-CM | POA: Diagnosis not present

## 2020-08-06 DIAGNOSIS — R413 Other amnesia: Secondary | ICD-10-CM | POA: Diagnosis not present

## 2020-08-06 DIAGNOSIS — R4781 Slurred speech: Secondary | ICD-10-CM | POA: Diagnosis not present

## 2020-08-07 DIAGNOSIS — R410 Disorientation, unspecified: Secondary | ICD-10-CM | POA: Diagnosis not present

## 2020-08-07 DIAGNOSIS — R1310 Dysphagia, unspecified: Secondary | ICD-10-CM | POA: Diagnosis not present

## 2020-08-07 DIAGNOSIS — R4781 Slurred speech: Secondary | ICD-10-CM | POA: Diagnosis not present

## 2020-08-08 ENCOUNTER — Other Ambulatory Visit: Payer: Self-pay

## 2020-08-08 ENCOUNTER — Encounter: Payer: Self-pay | Admitting: Family Medicine

## 2020-08-08 ENCOUNTER — Ambulatory Visit: Payer: Federal, State, Local not specified - PPO | Admitting: Family Medicine

## 2020-08-08 VITALS — BP 152/72 | HR 63 | Temp 98.8°F | Ht 64.5 in | Wt 284.0 lb

## 2020-08-08 DIAGNOSIS — E531 Pyridoxine deficiency: Secondary | ICD-10-CM | POA: Diagnosis not present

## 2020-08-08 DIAGNOSIS — E519 Thiamine deficiency, unspecified: Secondary | ICD-10-CM | POA: Diagnosis not present

## 2020-08-08 DIAGNOSIS — R231 Pallor: Secondary | ICD-10-CM | POA: Diagnosis not present

## 2020-08-08 DIAGNOSIS — R202 Paresthesia of skin: Secondary | ICD-10-CM | POA: Diagnosis not present

## 2020-08-08 DIAGNOSIS — L309 Dermatitis, unspecified: Secondary | ICD-10-CM | POA: Diagnosis not present

## 2020-08-08 DIAGNOSIS — R4781 Slurred speech: Secondary | ICD-10-CM | POA: Diagnosis not present

## 2020-08-08 DIAGNOSIS — E538 Deficiency of other specified B group vitamins: Secondary | ICD-10-CM | POA: Diagnosis not present

## 2020-08-08 DIAGNOSIS — R413 Other amnesia: Secondary | ICD-10-CM | POA: Diagnosis not present

## 2020-08-08 DIAGNOSIS — F4322 Adjustment disorder with anxiety: Secondary | ICD-10-CM

## 2020-08-08 DIAGNOSIS — E559 Vitamin D deficiency, unspecified: Secondary | ICD-10-CM | POA: Diagnosis not present

## 2020-08-08 DIAGNOSIS — R4789 Other speech disturbances: Secondary | ICD-10-CM | POA: Diagnosis not present

## 2020-08-08 DIAGNOSIS — R21 Rash and other nonspecific skin eruption: Secondary | ICD-10-CM | POA: Diagnosis not present

## 2020-08-08 DIAGNOSIS — D485 Neoplasm of uncertain behavior of skin: Secondary | ICD-10-CM | POA: Diagnosis not present

## 2020-08-08 MED ORDER — CITALOPRAM HYDROBROMIDE 20 MG PO TABS: 20.0000 mg | ORAL_TABLET | Freq: Every day | ORAL | 3 refills | Status: DC

## 2020-08-08 NOTE — Assessment & Plan Note (Signed)
Status post MRI, which we reviewed that was benign Followed by neurology Concerned that her anxiety, difficulty sleeping, and possible ADHD are contributing to this Anxiety treatment as above Consider ADD treatment after seeing if she improves with SSRI as above

## 2020-08-08 NOTE — Progress Notes (Signed)
Established patient visit   Patient: Brenda Ross   DOB: 1976/10/27   44 y.o. Female  MRN: [id number] Visit Date: 08/08/2020  Today's healthcare provider: Shirlee Latch, MD   Chief Complaint  Patient presents with  . Leg Pain   Subjective    HPI  Follow up for leg pain   The patient was last seen for this 2 weeks ago. Changes made at last visit include referred to hematology, dermatology.  She reports good compliance with treatment. She feels that condition is Unchanged. She is having side effects.   Depression screen [facility] 2/9 08/08/2020 02/13/2020 09/01/2018  Decreased Interest 2 0 0  Down, Depressed, Hopeless 2 0 0  PHQ - 2 Score 4 0 0  Altered sleeping 2 0 0  Tired, decreased energy 3 3 2   Change in appetite 1 3 2   Feeling bad or failure about yourself  3 0 0  Trouble concentrating 3 3 3   Moving slowly or fidgety/restless 3 0 1  Suicidal thoughts 0 0 0  PHQ-9 Score 19 9 8   Difficult doing work/chores Extremely dIfficult Somewhat difficult Somewhat difficult   GAD 7 : Generalized Anxiety Score 08/08/2020  Nervous, Anxious, on Edge 3  Control/stop worrying 3  Worry too much - different things 3  Trouble relaxing 3  Restless 3  Easily annoyed or irritable 3  Afraid - awful might happen 3  Total GAD 7 Score 21  Anxiety Difficulty Extremely difficult      Social History   Tobacco Use  . Smoking status: Former Smoker    Packs/day: 1.00    Years: 3.00    Pack years: 3.00    Types: Cigarettes    Quit date: 01/14/1996    Years since quitting: 24.5  . Smokeless tobacco: Never Used  Vaping Use  . Vaping Use: Never used  Substance Use Topics  . Alcohol use: Yes    Alcohol/week: 7.0 - 14.0 standard drinks    Types: 7 - 14 Glasses of wine per week  . Drug use: Never       Medications: Outpatient Medications Prior to Visit  Medication Sig  . cetirizine (ZYRTEC) 10 MG tablet Take 10 mg by mouth daily.  [name] Kitchen diltiazem (TIAZAC) 120 MG 24 hr capsule  Take 120 mg by mouth daily.  [name] Kitchen EPINEPHrine 0.3 mg/0.3 mL IJ SOAJ injection Inject 0.3 mLs (0.3 mg total) into the muscle as needed for anaphylaxis.  . famotidine (PEPCID) 40 MG tablet   . losartan (COZAAR) 100 MG tablet Take 1 tablet by mouth daily.  . montelukast (SINGULAIR) 10 MG tablet Take 1 tablet (10 mg total) by mouth daily.  . pantoprazole (PROTONIX) 40 MG tablet Take 1 tablet by mouth 2 (two) times daily.  . [DISCONTINUED] predniSONE (DELTASONE) 50 MG tablet Take 1 tab PO 13 hours before MRI, then 7 hours before MRI, and last 1 hr before MRI  . cephALEXin (KEFLEX) 500 MG capsule Take 1 capsule (500 mg total) by mouth 2 (two) times daily.   No facility-administered medications prior to visit.    Review of Systems  Constitutional: Negative.   Respiratory: Negative.   Cardiovascular: Negative.   Musculoskeletal: Positive for myalgias.  Skin: Positive for color change.  Neurological: Positive for numbness.  Psychiatric/Behavioral: Positive for agitation. The patient is nervous/anxious.     Last CBC Lab Results  Component Value Date   WBC 7.7 04/10/2020   HGB 11.5 04/10/2020   HCT 35.1 04/10/2020  MCV 82 04/10/2020   MCH 27.0 04/10/2020   RDW 13.5 04/10/2020   PLT 257 04/10/2020   Last metabolic panel Lab Results  Component Value Date   GLUCOSE 105 (H) 08/06/2019   NA 141 08/06/2019   K 4.2 08/06/2019   CL 104 08/06/2019   CO2 21 08/06/2019   BUN 11 08/06/2019   CREATININE 0.80 08/06/2019   GFRNONAA 90 08/06/2019   GFRAA 104 08/06/2019   CALCIUM 9.2 08/06/2019   PROT 7.0 12/21/2019   ALBUMIN 4.4 12/21/2019   LABGLOB 2.5 08/06/2019   AGRATIO 1.7 08/06/2019   BILITOT 0.5 12/21/2019   ALKPHOS 81 12/21/2019   AST 17 12/21/2019   ALT 24 12/21/2019   ANIONGAP 8 10/18/2018   Last thyroid functions Lab Results  Component Value Date   TSH 2.930 09/08/2018      Objective    BP (!) 152/72 (BP Location: Right Wrist)   Pulse 63   Temp 98.8 F (37.1 C)   Ht  5' 4.5" (1.638 m)   Wt 284 lb (128.8 kg)   SpO2 100%   BMI 48.00 kg/m  BP Readings from Last 3 Encounters:  08/08/20 (!) 152/72  07/24/20 111/73  07/22/20 (!) 137/93   Wt Readings from Last 3 Encounters:  08/08/20 284 lb (128.8 kg)  07/24/20 284 lb 9.6 oz (129.1 kg)  07/04/20 (!) 285 lb 12.8 oz (129.6 kg)      Physical Exam Vitals reviewed.  Constitutional:      General: She is not in acute distress.    Appearance: She is obese. She is not diaphoretic.  HENT:     Head: Normocephalic and atraumatic.  Eyes:     General: No scleral icterus.    Conjunctiva/sclera: Conjunctivae normal.  Cardiovascular:     Rate and Rhythm: Normal rate and regular rhythm.     Pulses: Normal pulses.     Heart sounds: Normal heart sounds.  Pulmonary:     Effort: Pulmonary effort is normal.     Breath sounds: Normal breath sounds.  Abdominal:     General: There is no distension.     Palpations: Abdomen is soft.     Tenderness: There is no abdominal tenderness.  Musculoskeletal:     Right lower leg: No edema.     Left lower leg: No edema.  Skin:    Comments: Livedo reticularis of RLE  Neurological:     Mental Status: She is alert and oriented to person, place, and time. Mental status is at baseline.       No results found for any visits on 08/08/20.  Assessment & Plan       Problem List Items Addressed This Visit      Cardiovascular and Mediastinum   Livedo reticularis    First noted a few weeks ago She had ultrasound to rule out DVT She is now seen rheumatology and started a vasculitis work-up with labs pending She saw dermatology today and had biopsy She remains concerned about additional imaging We discussed that vasculitis is diagnosed primarily with biopsy and labs and imaging is not recommended or needed at this time Patient seems reassured and knows to wait on the results of the lab work and biopsy per rheumatology and dermatology        Other   Morbid obesity [facility])      Discussed importance of healthy weight management Discussed diet and exercise Discussed obesity's role in spider veins and varicose veins  Adjustment disorder with anxious mood - Primary    Multiple stressors going on currently including significant chronic illness of her dog, stress at work, and now her own illness that is awaiting work-up She started seeing a therapist which has been helpful and she will continue to do this It is possible that her significant anxiety is contributing to her sleep issues and therefore her memory issues as well May consider ADD treatment in the future, pending improvement of anxiety to see if this helps with her concerns She is seeking FMLA for 1 month from her job to work on anxiety as well as work-up of her possible vasculitis as above She will send me the paperwork and I will complete Will Start Celexa 20 mg daily Discussed potential side effects, incl GI upset, sexual dysfunction, increased anxiety, and SI Discussed that it can take 6-8 weeks to reach full efficacy Contracted for safety - no SI/HI Discussed synergistic effects of medications and therapy  Follow-up in 1 month and repeat PHQ-9 and GAD-7      Relevant Medications   citalopram (CELEXA) 20 MG tablet   Memory difficulties    Status post MRI, which we reviewed that was benign Followed by neurology Concerned that her anxiety, difficulty sleeping, and possible ADHD are contributing to this Anxiety treatment as above Consider ADD treatment after seeing if she improves with SSRI as above         Total time spent on today's visit was greater than 30 minutes, including both face-to-face time and nonface-to-face time personally spent on review of chart (labs and imaging), discussing labs and goals, discussing further work-up, treatment options, referrals to specialist if needed, reviewing outside records of pertinent, answering patient's questions, and coordinating care.    Return in  about 4 weeks (around 09/05/2020) for Anxiety follow-up.      I, Shirlee Latch, MD, have reviewed all documentation for this visit. The documentation on 08/08/20 for the exam, diagnosis, procedures, and orders are all accurate and complete.   Valente Fosberg, Marzella Schlein, MD, MPH [facility]

## 2020-08-08 NOTE — Assessment & Plan Note (Signed)
Discussed importance of healthy weight management Discussed diet and exercise Discussed obesity's role in spider veins and varicose veins

## 2020-08-08 NOTE — Assessment & Plan Note (Signed)
First noted a few weeks ago She had ultrasound to rule out DVT She is now seen rheumatology and started a vasculitis work-up with labs pending She saw dermatology today and had biopsy She remains concerned about additional imaging We discussed that vasculitis is diagnosed primarily with biopsy and labs and imaging is not recommended or needed at this time Patient seems reassured and knows to wait on the results of the lab work and biopsy per rheumatology and dermatology

## 2020-08-08 NOTE — Patient Instructions (Signed)
May start B2 or CoQ10 to help with migraines.

## 2020-08-08 NOTE — Assessment & Plan Note (Signed)
Multiple stressors going on currently including significant chronic illness of her dog, stress at work, and now her own illness that is awaiting work-up She started seeing a therapist which has been helpful and she will continue to do this It is possible that her significant anxiety is contributing to her sleep issues and therefore her memory issues as well May consider ADD treatment in the future, pending improvement of anxiety to see if this helps with her concerns She is seeking FMLA for 1 month from her job to work on anxiety as well as work-up of her possible vasculitis as above She will send me the paperwork and I will complete Will Start Celexa 20 mg daily Discussed potential side effects, incl GI upset, sexual dysfunction, increased anxiety, and SI Discussed that it can take 6-8 weeks to reach full efficacy Contracted for safety - no SI/HI Discussed synergistic effects of medications and therapy  Follow-up in 1 month and repeat PHQ-9 and GAD-7

## 2020-08-11 DIAGNOSIS — Z0000 Encounter for general adult medical examination without abnormal findings: Secondary | ICD-10-CM | POA: Diagnosis not present

## 2020-08-11 DIAGNOSIS — I10 Essential (primary) hypertension: Secondary | ICD-10-CM | POA: Insufficient documentation

## 2020-08-11 DIAGNOSIS — K900 Celiac disease: Secondary | ICD-10-CM | POA: Insufficient documentation

## 2020-08-12 ENCOUNTER — Telehealth: Payer: Self-pay | Admitting: Family Medicine

## 2020-08-12 DIAGNOSIS — R600 Localized edema: Secondary | ICD-10-CM | POA: Diagnosis not present

## 2020-08-12 DIAGNOSIS — F331 Major depressive disorder, recurrent, moderate: Secondary | ICD-10-CM | POA: Diagnosis not present

## 2020-08-12 DIAGNOSIS — M79661 Pain in right lower leg: Secondary | ICD-10-CM | POA: Diagnosis not present

## 2020-08-12 NOTE — Telephone Encounter (Signed)
FYI  Per [facility], patient was advised she could stop the medicine and follow up with Dr B.  If symptoms worsened she is to seek attention.

## 2020-08-12 NOTE — Telephone Encounter (Signed)
Patient wants to stop taking citalopram (CELEXA) 20 MG tablet. She started on Sat. She wants to skip taking it tonight and stop taking because of the following:  Nausea Tingling all over Dizzy Blurry vision Dry eyes and mouth  Call patient back.  Thanks, [employer]

## 2020-08-13 NOTE — Telephone Encounter (Signed)
That's fine.  Does she want to try a different medication for her anxiety?  Some of her symptoms could be related to a panic attack, rather than medication reaction.  Could try hydroxyzine prn while starting baseline med.

## 2020-08-13 NOTE — Telephone Encounter (Signed)
Patient advised as below. Patient refused to start a new medication. Patient reports that she did not have a panic attack. Patient reports she had nausea, body was "piping hot", hot flash and increased thirst yesterday. Patient reports feeling better today. She reports that she also discontinued Zyrtec cause it was causing her sinus to be  dry. Patient will go get a COVID test if symptoms continue.

## 2020-08-13 NOTE — Telephone Encounter (Signed)
NA. Mail box is full 

## 2020-08-14 ENCOUNTER — Other Ambulatory Visit: Payer: Self-pay

## 2020-08-14 ENCOUNTER — Encounter (INDEPENDENT_AMBULATORY_CARE_PROVIDER_SITE_OTHER): Payer: Self-pay | Admitting: Nurse Practitioner

## 2020-08-14 ENCOUNTER — Ambulatory Visit (INDEPENDENT_AMBULATORY_CARE_PROVIDER_SITE_OTHER): Payer: Federal, State, Local not specified - PPO | Admitting: Nurse Practitioner

## 2020-08-14 VITALS — BP 145/88 | HR 58 | Resp 16 | Ht 64.0 in | Wt 285.0 lb

## 2020-08-14 DIAGNOSIS — M79604 Pain in right leg: Secondary | ICD-10-CM | POA: Diagnosis not present

## 2020-08-14 DIAGNOSIS — M79605 Pain in left leg: Secondary | ICD-10-CM

## 2020-08-14 DIAGNOSIS — R55 Syncope and collapse: Secondary | ICD-10-CM

## 2020-08-14 DIAGNOSIS — I10 Essential (primary) hypertension: Secondary | ICD-10-CM | POA: Diagnosis not present

## 2020-08-14 DIAGNOSIS — R600 Localized edema: Secondary | ICD-10-CM

## 2020-08-15 DIAGNOSIS — I10 Essential (primary) hypertension: Secondary | ICD-10-CM | POA: Diagnosis not present

## 2020-08-15 DIAGNOSIS — R600 Localized edema: Secondary | ICD-10-CM | POA: Diagnosis not present

## 2020-08-18 ENCOUNTER — Encounter (INDEPENDENT_AMBULATORY_CARE_PROVIDER_SITE_OTHER): Payer: Self-pay | Admitting: Nurse Practitioner

## 2020-08-18 NOTE — Progress Notes (Signed)
Subjective:    Patient ID: Brenda Ross, female    DOB: 10/30/1976, 44 y.o.   MRN: [id number] Chief Complaint  Patient presents with  . New Patient (Initial Visit)    ref Harlow Mares edema    The patient presents today as a referral from Dr. Harlow Mares in relation to lower extremity edema.  The patient notes that about 07/22/2020 she began to have some mottling in her lower extremities.  This is since resolved.  However at the time there was concern for possible DVT which previous ultrasounds have shown are negative.  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  The patient is concerned for possible vasculitis and recently had a biopsy done by her rheumatologist.  This biopsy so far has proven negative.  The patient noticed that at the time of the swelling and discomfort there are some localized areas of swelling.  These areas are red and painful to the touch.  Patient has a number of spider veins bilaterally which she also notices have tenderness with palpation.  There is also some muscle pain bilaterally with the patient is flexing her calf muscles.  Another concern for the patient is discoloration her bilateral toes.  She notes that sometimes the toes become slightly discolored.  She notes that this actually began recently during summertime.  No predictable pattern with temperature has yet been found.  She also endorses having some weakness and lightheadedness that has been ongoing recently as well.  She denies ever losing consciousness at any point time.  She denies any TIA or amaurosis fugax-like symptoms.  She denies any fever, chills, nausea, vomiting or diarrhea.   Review of Systems  Cardiovascular: Positive for leg swelling.  Skin: Positive for color change.  Neurological: Positive for dizziness.  All other systems reviewed and are negative.      Objective:   Physical Exam Vitals reviewed.  HENT:      Head: Normocephalic.  Cardiovascular:     Rate and Rhythm: Normal rate.     Pulses:          Dorsalis pedis pulses are 1+ on the right side and 1+ on the left side.       Posterior tibial pulses are 2+ on the right side and 2+ on the left side.  Pulmonary:     Effort: Pulmonary effort is normal.  Musculoskeletal:        General: Tenderness present.     Right lower leg: Edema present.     Left lower leg: Edema present.  Skin:    General: Skin is warm and dry.     Findings: Bruising present.  Neurological:     Mental Status: She is alert and oriented to person, place, and time.  Psychiatric:        Mood and Affect: Mood normal.        Behavior: Behavior normal.        Thought Content: Thought content normal.        Judgment: Judgment normal.     BP (!) 145/88 (BP Location: Right Arm)   Pulse (!) 58   Resp 16   Ht 5' 4"  (1.626 m)   Wt 285 lb (129.3 kg)   BMI 48.92 kg/m   Past Medical History:  Diagnosis Date  . Allergic rhinitis   . Anemia   . Anxiety   . GERD (gastroesophageal reflux disease)   . Herpes genitalia   .  Prediabetes   . Sjogren's disease ([place])   . Sleep apnea     Social History   Socioeconomic History  . Marital status: Married    Spouse name: Not on file  . Number of children: 1  . Years of education: Not on file  . Highest education level: Not on file  Occupational History  . Not on file  Tobacco Use  . Smoking status: Former Smoker    Packs/day: 1.00    Years: 3.00    Pack years: 3.00    Types: Cigarettes    Quit date: 01/14/1996    Years since quitting: 24.6  . Smokeless tobacco: Never Used  Vaping Use  . Vaping Use: Never used  Substance and Sexual Activity  . Alcohol use: Yes    Alcohol/week: 7.0 - 14.0 standard drinks    Types: 7 - 14 Glasses of wine per week  . Drug use: Never  . Sexual activity: Yes    Partners: Male    Birth control/protection: I.U.D.  Other Topics Concern  . Not on file  Social History Narrative  . Not  on file   Social Determinants of Health   Financial Resource Strain:   . Difficulty of Paying Living Expenses: Not on file  Food Insecurity:   . Worried About Charity fundraiser in the Last Year: Not on file  . Ran Out of Food in the Last Year: Not on file  Transportation Needs:   . Lack of Transportation (Medical): Not on file  . Lack of Transportation (Non-Medical): Not on file  Physical Activity:   . Days of Exercise per Week: Not on file  . Minutes of Exercise per Session: Not on file  Stress:   . Feeling of Stress : Not on file  Social Connections:   . Frequency of Communication with Friends and Family: Not on file  . Frequency of Social Gatherings with Friends and Family: Not on file  . Attends Religious Services: Not on file  . Active Member of Clubs or Organizations: Not on file  . Attends Archivist Meetings: Not on file  . Marital Status: Not on file  Intimate Partner Violence:   . Fear of Current or Ex-Partner: Not on file  . Emotionally Abused: Not on file  . Physically Abused: Not on file  . Sexually Abused: Not on file    Past Surgical History:  Procedure Laterality Date  . APPENDECTOMY      Family History  Problem Relation Age of Onset  . Diabetes Mother   . Hypertension Mother   . Heart disease Mother   . Allergic rhinitis Mother   . Hypertension Father   . Heart disease Father   . Prostate cancer Father   . Diabetes Father   . Diabetes Brother   . ADD / ADHD Brother   . Asthma Brother   . Allergic rhinitis Brother   . ADD / ADHD Brother   . Allergic rhinitis Brother   . Allergic rhinitis Brother   . Asthma Maternal Grandmother   . Breast cancer Neg Hx   . Colon cancer Neg Hx   . Ovarian cancer Neg Hx     Allergies  Allergen Reactions  . Iodinated Diagnostic Agents Hives, Shortness Of Breath, Swelling and Anaphylaxis  . Corn-Containing Products     Other reaction(s): Other (See Comments) Is sensitive to this  . Eggs Or  Egg-Derived Products Diarrhea  . Gluten Meal Diarrhea    Celiac's disease  .  Molds & Smuts Hives  . Pea Diarrhea       Assessment & Plan:   1. Lower extremity edema  Recommend:  The patient has large symptomatic varicose veins that are painful and associated with swelling.  I have had a long discussion with the patient regarding  varicose veins and why they cause symptoms.  Patient will begin wearing graduated compression stockings class 1 on a daily basis, beginning first thing in the morning and removing them in the evening. The patient is instructed specifically not to sleep in the stockings.    The patient  will also begin using over-the-counter analgesics such as Motrin 600 mg po TID to help control the symptoms.    In addition, behavioral modification including elevation during the day will be initiated.       An  ultrasound of the venous system will be obtained.   Further plans will be based on the ultrasound results and whether conservative therapies are successful at eliminating the pain and swelling.   2. Pain in both lower extremities  Recommend:  The patient has atypical pain symptoms for pure atherosclerotic disease. However, on physical exam there is evidence of mixed venous and arterial disease, given the diminished pulses and the edema associated with venous changes of the legs.  Noninvasive studies including ABI's and venous ultrasound of the legs will be obtained and the patient will follow up with me to review these studies.  I suspect the patient is c/o pseudoclaudication.  Patient should have an evaluation of his LS spine which I defer to the primary service.  The patient should continue walking and begin a more formal exercise program. The patient should continue his antiplatelet therapy and aggressive treatment of the lipid abnormalities.  The patient should begin wearing graduated compression socks 15-20 mmHg strength to control edema.   3. Essential  hypertension Continue antihypertensive medications as already ordered, these medications have been reviewed and there are no changes at this time.   4. Near syncope The patient has also noted some dizziness, lightheadedness.  Positional changes.  We will do a carotid at follow-up to rule out any vascular causes.   Current Outpatient Medications on File Prior to Visit  Medication Sig Dispense Refill  . ACIDOPHILUS LACTOBACILLUS PO Take by mouth.    [name] Kitchen aspirin EC 81 MG tablet Take 81 mg by mouth daily. Swallow whole.    . diltiazem (TIAZAC) 120 MG 24 hr capsule Take 120 mg by mouth daily.    [name] Kitchen EPINEPHrine 0.3 mg/0.3 mL IJ SOAJ injection Inject 0.3 mLs (0.3 mg total) into the muscle as needed for anaphylaxis. 2 each 1  . ergocalciferol (VITAMIN D2) 1.25 MG (50000 UT) capsule Take by mouth.    . famotidine (PEPCID) 40 MG tablet     . losartan (COZAAR) 100 MG tablet Take 1 tablet by mouth daily.    . magnesium oxide (MAG-OX) 400 MG tablet Take 400 mg by mouth daily.    . montelukast (SINGULAIR) 10 MG tablet Take 1 tablet (10 mg total) by mouth daily. 90 tablet 1  . Multiple Vitamins-Minerals (MULTIVITAMIN ADULT, MINERALS,) TABS Take by mouth.    . pantoprazole (PROTONIX) 40 MG tablet Take 1 tablet by mouth 2 (two) times daily.    [name] Kitchen thiamine (VITAMIN B-1) 50 MG tablet Take 50 mg by mouth daily.    . valACYclovir (VALTREX) 500 MG tablet Take by mouth.    . vitamin B-12 (CYANOCOBALAMIN) 500 MCG tablet Take 500 mcg by  mouth daily.    . cephALEXin (KEFLEX) 500 MG capsule Take 1 capsule (500 mg total) by mouth 2 (two) times daily. (Patient not taking: Reported on 08/14/2020) 14 capsule 0  . cetirizine (ZYRTEC) 10 MG tablet Take 10 mg by mouth daily. (Patient not taking: Reported on 08/14/2020)    . citalopram (CELEXA) 20 MG tablet Take 1 tablet (20 mg total) by mouth daily. (Patient not taking: Reported on 08/14/2020) 30 tablet 3   No current facility-administered medications on file prior to visit.     There are no Patient Instructions on file for this visit. No follow-ups on file.   Kris Hartmann, NP

## 2020-08-19 DIAGNOSIS — F331 Major depressive disorder, recurrent, moderate: Secondary | ICD-10-CM | POA: Diagnosis not present

## 2020-08-20 ENCOUNTER — Other Ambulatory Visit: Payer: Self-pay

## 2020-08-20 ENCOUNTER — Ambulatory Visit (INDEPENDENT_AMBULATORY_CARE_PROVIDER_SITE_OTHER): Payer: Federal, State, Local not specified - PPO

## 2020-08-20 ENCOUNTER — Ambulatory Visit (INDEPENDENT_AMBULATORY_CARE_PROVIDER_SITE_OTHER): Payer: Federal, State, Local not specified - PPO | Admitting: Nurse Practitioner

## 2020-08-20 DIAGNOSIS — M79604 Pain in right leg: Secondary | ICD-10-CM

## 2020-08-20 DIAGNOSIS — M79605 Pain in left leg: Secondary | ICD-10-CM | POA: Diagnosis not present

## 2020-08-20 DIAGNOSIS — R55 Syncope and collapse: Secondary | ICD-10-CM

## 2020-08-20 DIAGNOSIS — R600 Localized edema: Secondary | ICD-10-CM

## 2020-08-21 DIAGNOSIS — J301 Allergic rhinitis due to pollen: Secondary | ICD-10-CM | POA: Diagnosis not present

## 2020-08-21 DIAGNOSIS — R682 Dry mouth, unspecified: Secondary | ICD-10-CM | POA: Diagnosis not present

## 2020-08-21 DIAGNOSIS — R200 Anesthesia of skin: Secondary | ICD-10-CM | POA: Diagnosis not present

## 2020-08-21 DIAGNOSIS — J358 Other chronic diseases of tonsils and adenoids: Secondary | ICD-10-CM | POA: Diagnosis not present

## 2020-08-21 DIAGNOSIS — R202 Paresthesia of skin: Secondary | ICD-10-CM | POA: Diagnosis not present

## 2020-08-26 ENCOUNTER — Ambulatory Visit (INDEPENDENT_AMBULATORY_CARE_PROVIDER_SITE_OTHER): Payer: Federal, State, Local not specified - PPO | Admitting: Nurse Practitioner

## 2020-08-26 ENCOUNTER — Other Ambulatory Visit: Payer: Self-pay

## 2020-08-26 ENCOUNTER — Encounter (INDEPENDENT_AMBULATORY_CARE_PROVIDER_SITE_OTHER): Payer: Self-pay | Admitting: Nurse Practitioner

## 2020-08-26 VITALS — BP 154/80 | HR 49 | Ht 64.0 in | Wt 288.0 lb

## 2020-08-26 DIAGNOSIS — M79604 Pain in right leg: Secondary | ICD-10-CM

## 2020-08-26 DIAGNOSIS — R55 Syncope and collapse: Secondary | ICD-10-CM

## 2020-08-26 DIAGNOSIS — I890 Lymphedema, not elsewhere classified: Secondary | ICD-10-CM | POA: Diagnosis not present

## 2020-08-26 DIAGNOSIS — F331 Major depressive disorder, recurrent, moderate: Secondary | ICD-10-CM | POA: Diagnosis not present

## 2020-08-26 DIAGNOSIS — M79605 Pain in left leg: Secondary | ICD-10-CM

## 2020-08-26 DIAGNOSIS — I10 Essential (primary) hypertension: Secondary | ICD-10-CM

## 2020-08-31 NOTE — Progress Notes (Signed)
Subjective:    Patient ID: Brenda Ross, female    DOB: Mar 28, 1976, 44 y.o.   MRN: [id number] Chief Complaint  Patient presents with  . Follow-up    pt conv. BIl LE Ven reflux  carotid    The patient presents today as a referral from Dr. Harlow Mares in relation to lower extremity edema.  The patient notes that about 07/22/2020 she began to have some mottling in her lower extremities.  This is since resolved.  However at the time there was concern for possible DVT which previous ultrasounds have shown are negative.  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  The patient is concerned for possible vasculitis and recently had a biopsy done by her rheumatologist.  This biopsy so far has proven negative.  The patient noticed that at the time of the swelling and discomfort there are some localized areas of swelling.  These areas are red and painful to the touch.  Patient has a number of spider veins bilaterally which she also notices have tenderness with palpation.  There is also some muscle pain bilaterally with the patient is flexing her calf muscles.  Another concern for the patient is discoloration her bilateral toes.  She notes that sometimes the toes become slightly discolored.  She notes that this actually began recently during summertime.  No predictable pattern with temperature has yet been found.  She also endorses having some weakness and lightheadedness that has been ongoing recently as well.  She denies ever losing consciousness at any point time.  She denies any TIA or amaurosis fugax-like symptoms.  She denies any fever, chills, nausea, vomiting or diarrhea.  Today noninvasive studies show no evidence of thrombus/dissection/atherosclerotic plaque or stenosis in the bilateral carotid arteries.  Normal vertebral arteries with antegrade flow.  There is also normal flow hemodynamics in the bilateral subclavian  arteries.  The right ABI is 1.28 with a left ABI of 1.22.  The patient has good toe waveforms bilaterally with triphasic tibial artery waveforms bilaterally.  There is no evidence of DVT or superficial venous thrombosis bilaterally.  No evidence of deep venous insufficiency seen in the left lower extremity.  The right lower extremity has evidence of deep venous insufficiency in the popliteal vein with reflux in the great saphenous vein at the knee.   Review of Systems  Cardiovascular: Positive for leg swelling.  Neurological: Positive for light-headedness.  All other systems reviewed and are negative.      Objective:   Physical Exam Vitals reviewed.  HENT:     Head: Normocephalic.  Neck:     Vascular: No carotid bruit.  Cardiovascular:     Rate and Rhythm: Normal rate and regular rhythm.     Pulses: Normal pulses.  Pulmonary:     Effort: Pulmonary effort is normal.  Musculoskeletal:     Right lower leg: Edema present.     Left lower leg: Edema present.  Neurological:     Mental Status: She is alert and oriented to person, place, and time.  Psychiatric:        Mood and Affect: Mood normal.        Behavior: Behavior normal.        Thought Content: Thought content normal.        Judgment: Judgment normal.     BP (!) 154/80   Pulse (!) 49   Ht 5' 4"  (1.626 m)   Wt  288 lb (130.6 kg)   BMI 49.44 kg/m   Past Medical History:  Diagnosis Date  . Allergic rhinitis   . Anemia   . Anxiety   . GERD (gastroesophageal reflux disease)   . Herpes genitalia   . Prediabetes   . Sjogren's disease ([place])   . Sleep apnea     Social History   Socioeconomic History  . Marital status: Married    Spouse name: Not on file  . Number of children: 1  . Years of education: Not on file  . Highest education level: Not on file  Occupational History  . Not on file  Tobacco Use  . Smoking status: Former Smoker    Packs/day: 1.00    Years: 3.00    Pack years: 3.00    Types:  Cigarettes    Quit date: 01/14/1996    Years since quitting: 24.6  . Smokeless tobacco: Never Used  Vaping Use  . Vaping Use: Never used  Substance and Sexual Activity  . Alcohol use: Yes    Alcohol/week: 7.0 - 14.0 standard drinks    Types: 7 - 14 Glasses of wine per week  . Drug use: Never  . Sexual activity: Yes    Partners: Male    Birth control/protection: I.U.D.  Other Topics Concern  . Not on file  Social History Narrative  . Not on file   Social Determinants of Health   Financial Resource Strain:   . Difficulty of Paying Living Expenses: Not on file  Food Insecurity:   . Worried About Charity fundraiser in the Last Year: Not on file  . Ran Out of Food in the Last Year: Not on file  Transportation Needs:   . Lack of Transportation (Medical): Not on file  . Lack of Transportation (Non-Medical): Not on file  Physical Activity:   . Days of Exercise per Week: Not on file  . Minutes of Exercise per Session: Not on file  Stress:   . Feeling of Stress : Not on file  Social Connections:   . Frequency of Communication with Friends and Family: Not on file  . Frequency of Social Gatherings with Friends and Family: Not on file  . Attends Religious Services: Not on file  . Active Member of Clubs or Organizations: Not on file  . Attends Archivist Meetings: Not on file  . Marital Status: Not on file  Intimate Partner Violence:   . Fear of Current or Ex-Partner: Not on file  . Emotionally Abused: Not on file  . Physically Abused: Not on file  . Sexually Abused: Not on file    Past Surgical History:  Procedure Laterality Date  . APPENDECTOMY      Family History  Problem Relation Age of Onset  . Diabetes Mother   . Hypertension Mother   . Heart disease Mother   . Allergic rhinitis Mother   . Hypertension Father   . Heart disease Father   . Prostate cancer Father   . Diabetes Father   . Diabetes Brother   . ADD / ADHD Brother   . Asthma Brother   .  Allergic rhinitis Brother   . ADD / ADHD Brother   . Allergic rhinitis Brother   . Allergic rhinitis Brother   . Asthma Maternal Grandmother   . Breast cancer Neg Hx   . Colon cancer Neg Hx   . Ovarian cancer Neg Hx     Allergies  Allergen Reactions  .  Iodinated Diagnostic Agents Hives, Shortness Of Breath, Swelling and Anaphylaxis  . Corn-Containing Products     Other reaction(s): Other (See Comments) Is sensitive to this  . Eggs Or Egg-Derived Products Diarrhea  . Gluten Meal Diarrhea    Celiac's disease  . Molds & Smuts Hives  . Pea Diarrhea       Assessment & Plan:   1. Pain in both lower extremities Recommend:  I do not find evidence of Vascular pathology that would explain the patient's symptoms  The patient has atypical pain symptoms for vascular disease  I do not find evidence of Vascular pathology that would explain the patient's symptoms and I suspect the patient is c/o pseudoclaudication.  Patient should have an evaluation of his LS spine which I defer to the primary service.  Noninvasive studies including venous ultrasound of the legs do not identify vascular problems  The patient should continue walking and begin a more formal exercise program. The patient should continue his antiplatelet therapy and aggressive treatment of the lipid abnormalities. The patient should begin wearing graduated compression socks 15-20 mmHg strength to control her mild edema.  Patient will follow-up with me on a PRN basis  Further work-up of her lower extremity pain is deferred to the primary service     2. Near syncope No evidence of significant stenosis within the carotid arteries to explain possible dizziness/lightheadedness.  Patient will continue to follow with PCP for work-up.  3. Essential hypertension Continue antihypertensive medications as already ordered, these medications have been reviewed and there are no changes at this time.   4. Lymphedema I have had a  long discussion with the patient regarding swelling and why it  causes symptoms.  Patient will begin wearing graduated compression stockings class 1 (20-30 mmHg) on a daily basis a prescription was given. The patient will  beginning wearing the stockings first thing in the morning and removing them in the evening. The patient is instructed specifically not to sleep in the stockings.   In addition, behavioral modification will be initiated.  This will include frequent elevation, use of over the counter pain medications and exercise such as walking.  I have reviewed systemic causes for chronic edema such as liver, kidney and cardiac etiologies.  The patient denies problems with these organ systems.    Consideration for a lymph pump will also be made based upon the effectiveness of conservative therapy.  This would help to improve the edema control and prevent sequela such as ulcers and infections   Patient will follow up in six months     Current Outpatient Medications on File Prior to Visit  Medication Sig Dispense Refill  . ACIDOPHILUS LACTOBACILLUS PO Take by mouth.    [name] Kitchen aspirin EC 81 MG tablet Take 81 mg by mouth daily. Swallow whole.    . Biotin 1000 MCG CHEW Chew by mouth.    . diltiazem (TIAZAC) 120 MG 24 hr capsule Take 120 mg by mouth daily.    [name] Kitchen EPINEPHrine 0.3 mg/0.3 mL IJ SOAJ injection Inject 0.3 mLs (0.3 mg total) into the muscle as needed for anaphylaxis. 2 each 1  . ergocalciferol (VITAMIN D2) 1.25 MG (50000 UT) capsule Take by mouth.    . famotidine (PEPCID) 40 MG tablet     . losartan (COZAAR) 100 MG tablet Take 1 tablet by mouth daily.    . magnesium oxide (MAG-OX) 400 MG tablet Take 400 mg by mouth daily.    . montelukast (SINGULAIR) 10 MG tablet  Take 1 tablet (10 mg total) by mouth daily. 90 tablet 1  . Multiple Vitamins-Minerals (MULTIVITAMIN ADULT, MINERALS,) TABS Take by mouth.    . pantoprazole (PROTONIX) 40 MG tablet Take 1 tablet by mouth 2 (two) times daily.    [name] Kitchen  thiamine (VITAMIN B-1) 50 MG tablet Take 50 mg by mouth daily.    . valACYclovir (VALTREX) 500 MG tablet Take by mouth.    . vitamin B-12 (CYANOCOBALAMIN) 500 MCG tablet Take 500 mcg by mouth daily.    . cephALEXin (KEFLEX) 500 MG capsule Take 1 capsule (500 mg total) by mouth 2 (two) times daily. (Patient not taking: Reported on 08/14/2020) 14 capsule 0  . cetirizine (ZYRTEC) 10 MG tablet Take 10 mg by mouth daily. (Patient not taking: Reported on 08/14/2020)    . Cholecalciferol 50 MCG (2000 UT) TABS Take by mouth.    . citalopram (CELEXA) 20 MG tablet Take 1 tablet (20 mg total) by mouth daily. (Patient not taking: Reported on 08/14/2020) 30 tablet 3   No current facility-administered medications on file prior to visit.    There are no Patient Instructions on file for this visit. No follow-ups on file.   Kris Hartmann, NP

## 2020-09-01 ENCOUNTER — Encounter (INDEPENDENT_AMBULATORY_CARE_PROVIDER_SITE_OTHER): Payer: Self-pay | Admitting: Nurse Practitioner

## 2020-09-02 ENCOUNTER — Encounter: Payer: Self-pay | Admitting: Family Medicine

## 2020-09-02 ENCOUNTER — Other Ambulatory Visit: Payer: Self-pay

## 2020-09-02 ENCOUNTER — Ambulatory Visit: Payer: Federal, State, Local not specified - PPO | Admitting: Family Medicine

## 2020-09-02 VITALS — BP 138/77 | HR 90 | Temp 98.7°F | Wt 288.0 lb

## 2020-09-02 DIAGNOSIS — F4322 Adjustment disorder with anxiety: Secondary | ICD-10-CM | POA: Diagnosis not present

## 2020-09-02 DIAGNOSIS — R300 Dysuria: Secondary | ICD-10-CM

## 2020-09-02 LAB — POCT URINALYSIS DIPSTICK
Bilirubin, UA: NEGATIVE
Blood, UA: NEGATIVE
Glucose, UA: NEGATIVE
Ketones, UA: NEGATIVE
Leukocytes, UA: NEGATIVE
Nitrite, UA: NEGATIVE
Protein, UA: NEGATIVE
Spec Grav, UA: 1.01 (ref 1.010–1.025)
Urobilinogen, UA: 0.2 E.U./dL
pH, UA: 6 (ref 5.0–8.0)

## 2020-09-02 MED ORDER — HYDROXYZINE HCL 10 MG PO TABS: 10.0000 mg | ORAL_TABLET | Freq: Three times a day (TID) | ORAL | 0 refills | Status: DC | PRN

## 2020-09-02 MED ORDER — SERTRALINE HCL 50 MG PO TABS: 50.0000 mg | ORAL_TABLET | Freq: Every day | ORAL | 3 refills | Status: DC

## 2020-09-02 MED ORDER — ERGOCALCIFEROL 1.25 MG (50000 UT) PO CAPS
50000.0000 [IU] | ORAL_CAPSULE | ORAL | 1 refills | Status: DC
Start: 2020-09-02 — End: 2022-09-14

## 2020-09-02 NOTE — Progress Notes (Signed)
Established patient visit   Patient: Brenda Ross   DOB: August 21, 1976   44 y.o. Female  MRN: [id number] Visit Date: 09/02/2020  Today's healthcare provider: Shirlee Latch, MD   Chief Complaint  Patient presents with  . Anxiety  . Dysuria   Subjective    Anxiety Presents for follow-up visit. Patient reports no decreased concentration, depressed mood, excessive worry, insomnia, nervous/anxious behavior or shortness of breath. The quality of sleep is good. Nighttime awakenings: occasional.    Urinary Tract Infection  This is a recurrent problem. The current episode started 1 to 4 weeks ago. The problem has been unchanged. There has been no fever. Pertinent negatives include no discharge, flank pain, frequency, possible pregnancy, urgency or vomiting. She has tried nothing for the symptoms.     Patient did not tolerate Celexa for her anxiety/adjustment disorder.  She reports significant side effects including body aches, GI upset.  She got better very soon after stopping.  She has never tried other medications, but her daughter has had adverse effects from several different mood stabilizers, BuSpar, SSRIs.  She is still working with her therapist and finds this very helpful.  She states being out of work on [employer] has been helpful as she is able to work on her mindfulness, exercise, therapy, and boundary setting, which has been difficult for her in the past.  She had a setback recently after her cousin was murdered in [place].  She thinks she is coping all right, however.    Medications: Outpatient Medications Prior to Visit  Medication Sig  . ACIDOPHILUS LACTOBACILLUS PO Take by mouth.  [name] Kitchen aspirin EC 81 MG tablet Take 81 mg by mouth daily. Swallow whole.  . Biotin 1000 MCG CHEW Chew by mouth.  . Cholecalciferol 50 MCG (2000 UT) TABS Take by mouth.  . diltiazem (TIAZAC) 120 MG 24 hr capsule Take 120 mg by mouth daily.  [name] Kitchen EPINEPHrine 0.3 mg/0.3 mL IJ SOAJ injection Inject 0.3  mLs (0.3 mg total) into the muscle as needed for anaphylaxis.  . famotidine (PEPCID) 40 MG tablet   . losartan (COZAAR) 100 MG tablet Take 1 tablet by mouth daily.  . magnesium oxide (MAG-OX) 400 MG tablet Take 400 mg by mouth daily.  . montelukast (SINGULAIR) 10 MG tablet Take 1 tablet (10 mg total) by mouth daily.  . Multiple Vitamins-Minerals (MULTIVITAMIN ADULT, MINERALS,) TABS Take by mouth.  . pantoprazole (PROTONIX) 40 MG tablet Take 1 tablet by mouth 2 (two) times daily.  [name] Kitchen thiamine (VITAMIN B-1) 50 MG tablet Take 50 mg by mouth daily.  . valACYclovir (VALTREX) 500 MG tablet Take by mouth.  . [DISCONTINUED] ergocalciferol (VITAMIN D2) 1.25 MG (50000 UT) capsule Take by mouth.  . [DISCONTINUED] cephALEXin (KEFLEX) 500 MG capsule Take 1 capsule (500 mg total) by mouth 2 (two) times daily. (Patient not taking: Reported on 08/14/2020)  . [DISCONTINUED] cetirizine (ZYRTEC) 10 MG tablet Take 10 mg by mouth daily. (Patient not taking: Reported on 08/14/2020)  . [DISCONTINUED] citalopram (CELEXA) 20 MG tablet Take 1 tablet (20 mg total) by mouth daily. (Patient not taking: Reported on 08/14/2020)  . [DISCONTINUED] vitamin B-12 (CYANOCOBALAMIN) 500 MCG tablet Take 500 mcg by mouth daily. (Patient not taking: Reported on 09/02/2020)   No facility-administered medications prior to visit.    Review of Systems  Constitutional: Negative.   Respiratory: Negative.  Negative for shortness of breath.   Cardiovascular: Negative.   Gastrointestinal: Negative for vomiting.  Genitourinary: Negative for flank pain,  frequency and urgency.  Psychiatric/Behavioral: Negative.  Negative for decreased concentration. The patient is not nervous/anxious and does not have insomnia.       Objective    BP 138/77 (BP Location: Left Arm, Patient Position: Sitting, Cuff Size: Large)   Pulse 90   Temp 98.7 F (37.1 C) (Oral)   Wt 288 lb (130.6 kg)   SpO2 100%   BMI 49.44 kg/m    Physical Exam Vitals reviewed.    Constitutional:      General: She is not in acute distress.    Appearance: Normal appearance. She is well-developed. She is not diaphoretic.  HENT:     Head: Normocephalic and atraumatic.  Eyes:     General: No scleral icterus.    Conjunctiva/sclera: Conjunctivae normal.  Cardiovascular:     Rate and Rhythm: Normal rate and regular rhythm.  Pulmonary:     Effort: Pulmonary effort is normal. No respiratory distress.  Skin:    General: Skin is warm and dry.     Findings: No rash.  Neurological:     Mental Status: She is alert and oriented to person, place, and time.  Psychiatric:        Mood and Affect: Affect normal. Mood is anxious.        Speech: Speech normal.        Behavior: Behavior normal.        Thought Content: Thought content does not include homicidal or suicidal ideation.       Results for orders placed or performed in visit on 09/02/20  POCT urinalysis dipstick  Result Value Ref Range   Color, UA     Clarity, UA     Glucose, UA Negative Negative   Bilirubin, UA Negative    Ketones, UA Negative    Spec Grav, UA 1.010 1.010 - 1.025   Blood, UA Negative    pH, UA 6.0 5.0 - 8.0   Protein, UA Negative Negative   Urobilinogen, UA 0.2 0.2 or 1.0 E.U./dL   Nitrite, UA Negative    Leukocytes, UA Negative Negative   Appearance     Odor      Assessment & Plan     Problem List Items Addressed This Visit      Other   Adjustment disorder with anxious mood - Primary    Multiple stressors contributing to this including chronic illness of her dog, stress at work, recent illness that required work-up for multiple specialists, and murder of her cousin She is slowly improving Advised her to continue working with therapy as this has been very helpful Her sleep and memory issues are starting to improve She is currently on FMLA due to anxiety She is planning to return to work in mid October 2021 She did not tolerate Celexa well She is willing to try Zoloft at a low  dose Will Start Zoloft 50 mg daily -but advised patient she can start with 25 mg daily for 1 week Discussed potential side effects, incl GI upset, sexual dysfunction, increased anxiety, and SI Discussed that it can take 6-8 weeks to reach full efficacy Contracted for safety - no SI/HI Discussed synergistic effects of medications and therapy  Can use hydroxyzine as needed for acute anxiety episodes Avoid habit-forming medications Follow-up in 1 month and repeat PHQ-9 and GAD-7 and consider dose titration       Other Visit Diagnoses    Dysuria       Relevant Orders   POCT urinalysis dipstick (Completed)  CULTURE, URINE COMPREHENSIVE    - UA normal - patient reassured -Return precautions discussed   Return in about 4 weeks (around 09/30/2020) for MDD/GAD f/u, virtual ok.      Total time spent on today's visit was greater than 30 minutes, including both face-to-face time and nonface-to-face time personally spent on review of chart (labs and imaging), discussing labs and goals, discussing further work-up, treatment options, referrals to specialist if needed, reviewing outside records of pertinent, answering patient's questions, and coordinating care.    I, Shirlee Latch, MD, have reviewed all documentation for this visit. The documentation on 09/02/20 for the exam, diagnosis, procedures, and orders are all accurate and complete.   Giada Schoppe, Marzella Schlein, MD, MPH [facility]

## 2020-09-02 NOTE — Assessment & Plan Note (Signed)
Multiple stressors contributing to this including chronic illness of her dog, stress at work, recent illness that required work-up for multiple specialists, and murder of her cousin She is slowly improving Advised her to continue working with therapy as this has been very helpful Her sleep and memory issues are starting to improve She is currently on FMLA due to anxiety She is planning to return to work in mid October 2021 She did not tolerate Celexa well She is willing to try Zoloft at a low dose Will Start Zoloft 50 mg daily -but advised patient she can start with 25 mg daily for 1 week Discussed potential side effects, incl GI upset, sexual dysfunction, increased anxiety, and SI Discussed that it can take 6-8 weeks to reach full efficacy Contracted for safety - no SI/HI Discussed synergistic effects of medications and therapy  Can use hydroxyzine as needed for acute anxiety episodes Avoid habit-forming medications Follow-up in 1 month and repeat PHQ-9 and GAD-7 and consider dose titration

## 2020-09-04 DIAGNOSIS — B353 Tinea pedis: Secondary | ICD-10-CM | POA: Diagnosis not present

## 2020-09-04 DIAGNOSIS — L812 Freckles: Secondary | ICD-10-CM | POA: Diagnosis not present

## 2020-09-04 DIAGNOSIS — L718 Other rosacea: Secondary | ICD-10-CM | POA: Diagnosis not present

## 2020-09-04 DIAGNOSIS — L308 Other specified dermatitis: Secondary | ICD-10-CM | POA: Diagnosis not present

## 2020-09-11 DIAGNOSIS — M2550 Pain in unspecified joint: Secondary | ICD-10-CM | POA: Diagnosis not present

## 2020-09-11 DIAGNOSIS — M3500 Sicca syndrome, unspecified: Secondary | ICD-10-CM | POA: Diagnosis not present

## 2020-09-11 DIAGNOSIS — G629 Polyneuropathy, unspecified: Secondary | ICD-10-CM | POA: Diagnosis not present

## 2020-09-11 DIAGNOSIS — Z79899 Other long term (current) drug therapy: Secondary | ICD-10-CM | POA: Diagnosis not present

## 2020-09-12 ENCOUNTER — Ambulatory Visit (INDEPENDENT_AMBULATORY_CARE_PROVIDER_SITE_OTHER): Payer: Federal, State, Local not specified - PPO | Admitting: Allergy

## 2020-09-12 ENCOUNTER — Other Ambulatory Visit: Payer: Self-pay

## 2020-09-12 ENCOUNTER — Encounter: Payer: Self-pay | Admitting: Allergy

## 2020-09-12 VITALS — BP 124/82 | HR 50 | Temp 98.4°F | Resp 16

## 2020-09-12 DIAGNOSIS — T7800XD Anaphylactic reaction due to unspecified food, subsequent encounter: Secondary | ICD-10-CM

## 2020-09-12 DIAGNOSIS — K9049 Malabsorption due to intolerance, not elsewhere classified: Secondary | ICD-10-CM

## 2020-09-12 DIAGNOSIS — T508X5D Adverse effect of diagnostic agents, subsequent encounter: Secondary | ICD-10-CM

## 2020-09-12 DIAGNOSIS — J358 Other chronic diseases of tonsils and adenoids: Secondary | ICD-10-CM

## 2020-09-12 DIAGNOSIS — J3089 Other allergic rhinitis: Secondary | ICD-10-CM | POA: Diagnosis not present

## 2020-09-12 DIAGNOSIS — F331 Major depressive disorder, recurrent, moderate: Secondary | ICD-10-CM | POA: Diagnosis not present

## 2020-09-12 DIAGNOSIS — H9201 Otalgia, right ear: Secondary | ICD-10-CM

## 2020-09-12 DIAGNOSIS — K2100 Gastro-esophageal reflux disease with esophagitis, without bleeding: Secondary | ICD-10-CM

## 2020-09-12 NOTE — Patient Instructions (Addendum)
-  Your previous testing from [facility] done in Jan 2020 was positive to molds, dust mites, cockroach.  At this time will consider repeat testing if we need to proceed with allergen immunotherapy (allergy shots).   -Continue allergen avoidance measures  -Use Allegra 1870m daily.  Other option to try if Allegra is not effective is Levocetirine (Xyzal) 58mdaily which should be better tolerated than Zyrtec -Continue singular 10 mg daily at bedtime -Use nasal saline spray to help loosen mucus and clean the nose.  This may help to tolerate use of a medicated nasal spray  -Continue avoidance of apples, gluten products, peas, eggs as well as iodinated contrast media -Have access to self-injectable epinephrine (Epipen or AuviQ) 0.70m72mt all times -Follow emergency action plan in case of allergic reaction -If she should need a contrast study that there is a premedication protocol that can be employed for safe use of the contrast  - we have discussed the following in regards to foods at initial visit:   Allergy: food allergy is when you have eaten a food, developed an allergic reaction after eating the food and have IgE to the food (positive food testing either by skin testing or blood testing).  Food allergy could lead to life threatening symptoms  Sensitivity: occurs when you have IgE to a food (positive food testing either by skin testing or blood testing) but is a food you eat without any issues.  This is not an allergy and we recommend keeping the food in the diet  Intolerance: this is when you have negative testing by either skin testing or blood testing thus not allergic but the food causes symptoms (like belly pain, bloating, diarrhea etc) with ingestion.  These foods should be avoided to prevent symptoms.    -Continue famotidine   Follow-up in 4-6 months or sooner if needed

## 2020-09-12 NOTE — Progress Notes (Signed)
Follow-up Note  RE: Brenda Ross MRN: [id number] DOB: 02/06/1976 Date of Office Visit: 09/12/2020   History of present illness: Brenda Ross is a 44 y.o. female presenting today for follow-up of allergic rhinitis, food allergy, GERD, tonsillar stones and medication reactions.  She was last seen in the office on 07/04/2020 by myself.  She states since his last visit she was diagnosed with Sjogren's syndrome.  She states she was recommended by her primary to stop taking Zyrtec due to her Sjogren's and potentially the drying antihistamine effect.  However she states her rheumatologist said it was okay to use antihistamines as long as it did not contain a decongestant.  She wanted to see today what other antihistamine she could take.  She was thinking potentially Allegra.  She states that since stopping the Zyrtec she has been itching and having nausea.  That she does not want to go back on Zyrtec but would like to try different agent.  Without being on antihistamines she is having more allergy symptoms like of right ear ache with some crackling that she senses.  She [place] the [place] developing the ear pain and did not get water in her ear but that she had drained her ears pretty well. She does continue to take the Singulair.  At this time not using any nasal sprays but she is willing to try use of nasal saline spray at this time. She does continue to take famotidine. She continues to avoid apples, gluten products, peas, eggs and iodinated contrast media.  She has not had any reactions warranting use of an epinephrine device. She states she was called by the [facility] after replacing referral for tonsillar stones but it was going to take a long time to get her to be seen that she saw a different ENT.  She states this person just recommended she do warm water gargles and get a WaterPik to help with her tonsillar stones.  Review of systems: Review of Systems    Constitutional: Negative.   HENT: Positive for ear pain.   Eyes: Negative.   Respiratory: Negative.   Cardiovascular: Negative.   Gastrointestinal: Negative.   Musculoskeletal: Negative.   Skin: Positive for itching. Negative for rash.  Neurological: Negative.     All other systems negative unless noted above in HPI  Past medical/social/surgical/family history have been reviewed and are unchanged unless specifically indicated below.  No changes  Medication List: Current Outpatient Medications  Medication Sig Dispense Refill  . ACIDOPHILUS LACTOBACILLUS PO Take by mouth.    [name] Kitchen aspirin EC 81 MG tablet Take 81 mg by mouth daily. Swallow whole.    . Biotin 1000 MCG CHEW Chew by mouth.    . Cholecalciferol 50 MCG (2000 UT) TABS Take by mouth.    . diltiazem (TIAZAC) 120 MG 24 hr capsule Take 120 mg by mouth daily.    [name] Kitchen EPINEPHrine 0.3 mg/0.3 mL IJ SOAJ injection Inject 0.3 mLs (0.3 mg total) into the muscle as needed for anaphylaxis. 2 each 1  . ergocalciferol (VITAMIN D2) 1.25 MG (50000 UT) capsule Take 1 capsule (50,000 Units total) by mouth once a week. 4 capsule 1  . famotidine (PEPCID) 40 MG tablet     . hydrOXYzine (ATARAX/VISTARIL) 10 MG tablet Take 1 tablet (10 mg total) by mouth 3 (three) times daily as needed for anxiety. 30 tablet 0  . losartan (COZAAR) 100 MG tablet Take 1 tablet by mouth daily.    [name] Kitchen  magnesium oxide (MAG-OX) 400 MG tablet Take 400 mg by mouth daily.    . montelukast (SINGULAIR) 10 MG tablet Take 1 tablet (10 mg total) by mouth daily. 90 tablet 1  . Multiple Vitamins-Minerals (MULTIVITAMIN ADULT, MINERALS,) TABS Take by mouth.    . pantoprazole (PROTONIX) 40 MG tablet Take 1 tablet by mouth 2 (two) times daily.    . sertraline (ZOLOFT) 50 MG tablet Take 1 tablet (50 mg total) by mouth daily. 30 tablet 3  . thiamine (VITAMIN B-1) 50 MG tablet Take 50 mg by mouth daily.    . valACYclovir (VALTREX) 500 MG tablet Take by mouth.     No current  facility-administered medications for this visit.     Known medication allergies: Allergies  Allergen Reactions  . Iodinated Diagnostic Agents Hives, Shortness Of Breath, Swelling and Anaphylaxis  . Citalopram Other (See Comments)    Nausea, myalgias  . Corn-Containing Products     Other reaction(s): Other (See Comments) Is sensitive to this  . Eggs Or Egg-Derived Products Diarrhea  . Gluten Meal Diarrhea    Celiac's disease  . Molds & Smuts Hives  . Pea Diarrhea     Physical examination: Blood pressure 124/82, pulse (!) 50, temperature 98.4 F (36.9 C), temperature source Temporal, resp. rate 16, SpO2 98 %.  General: Alert, interactive, in no acute distress. HEENT: PERRLA, TMs pearly gray, turbinates non-edematous without discharge, post-pharynx non erythematous. Neck: Supple without lymphadenopathy. Lungs: Clear to auscultation without wheezing, rhonchi or rales. {no increased work of breathing. CV: Normal S1, S2 without murmurs. Abdomen: Nondistended, nontender. Skin: Warm and dry, without lesions or rashes. Extremities:  No clubbing, cyanosis or edema. Neuro:   Grossly intact.  Diagnositics/Labs: None today  Assessment and plan: Allergic rhinitis Anaphylaxis due to food Food intolerance Adverse effect of drug GERD Tonsil stones Right ear pain   -Your previous testing from [facility] done in Jan 2020 was positive to molds, dust mites, cockroach.  At this time will consider repeat testing if we need to proceed with allergen immunotherapy (allergy shots).   -Continue allergen avoidance measures  -Use Allegra 187m daily.  Other option to try if Allegra is not effective is Levocetirine (Xyzal) 558mdaily which should be better tolerated than Zyrtec -Continue singular 10 mg daily at bedtime -Use nasal saline spray to help loosen mucus and clean the nose.  This may help to tolerate use of a medicated nasal spray  -Continue avoidance of apples, gluten products,  peas, eggs as well as iodinated contrast media -Have access to self-injectable epinephrine (Epipen or AuviQ) 0.41m85mt all times -Follow emergency action plan in case of allergic reaction -If she should need a contrast study that there is a premedication protocol that can be employed for safe use of the contrast  - we have discussed the following in regards to foods at initial visit:   Allergy: food allergy is when you have eaten a food, developed an allergic reaction after eating the food and have IgE to the food (positive food testing either by skin testing or blood testing).  Food allergy could lead to life threatening symptoms  Sensitivity: occurs when you have IgE to a food (positive food testing either by skin testing or blood testing) but is a food you eat without any issues.  This is not an allergy and we recommend keeping the food in the diet  Intolerance: this is when you have negative testing by either skin testing or blood testing thus not  allergic but the food causes symptoms (like belly pain, bloating, diarrhea etc) with ingestion.  These foods should be avoided to prevent symptoms.    -Continue famotidine   Follow-up in 4-6 months or sooner if needed  I appreciate the opportunity to take part in Kjersten's care. Please do not hesitate to contact me with questions.  Sincerely,   Prudy Feeler, MD Allergy/Immunology [facility] [place] [facility] [place]

## 2020-09-15 DIAGNOSIS — N920 Excessive and frequent menstruation with regular cycle: Secondary | ICD-10-CM | POA: Diagnosis not present

## 2020-09-15 DIAGNOSIS — Z30432 Encounter for removal of intrauterine contraceptive device: Secondary | ICD-10-CM | POA: Diagnosis not present

## 2020-09-16 DIAGNOSIS — F331 Major depressive disorder, recurrent, moderate: Secondary | ICD-10-CM | POA: Diagnosis not present

## 2020-09-17 DIAGNOSIS — G4733 Obstructive sleep apnea (adult) (pediatric): Secondary | ICD-10-CM | POA: Diagnosis not present

## 2020-09-19 ENCOUNTER — Telehealth: Payer: Self-pay

## 2020-09-19 NOTE — Telephone Encounter (Signed)
Copied from [place] [id number]. Topic: General - Other >> Sep 19, 2020  3:09 PM Keene Breath wrote: Reason for CRM: Patient called to speak with the nurse regarding her visit to the OB GYN.  She stated she is still not feeling well and wanted to speak with the PCP nurse and get advice about her condition.  CB# [phone]

## 2020-09-22 DIAGNOSIS — D259 Leiomyoma of uterus, unspecified: Secondary | ICD-10-CM | POA: Diagnosis not present

## 2020-09-22 DIAGNOSIS — N920 Excessive and frequent menstruation with regular cycle: Secondary | ICD-10-CM | POA: Diagnosis not present

## 2020-09-22 NOTE — Telephone Encounter (Signed)
Interesting.  I saw her after the establish care with [facility] and was not aware.  Need to discuss with her that she needs to pick one and cannot continue to see PCP at both practices.

## 2020-09-22 NOTE — Telephone Encounter (Signed)
Please review.  It looks like this pt established care at [facility] 08/11/2020 and had a physical with them.  She is also schedule to see them today.  (and also a future apt with you on 10/02/2020)  Thanks,   -Mickel Baas

## 2020-09-23 DIAGNOSIS — F331 Major depressive disorder, recurrent, moderate: Secondary | ICD-10-CM | POA: Diagnosis not present

## 2020-09-24 ENCOUNTER — Encounter: Payer: Self-pay | Admitting: Family Medicine

## 2020-09-25 ENCOUNTER — Encounter: Payer: Self-pay | Admitting: Family Medicine

## 2020-09-25 NOTE — Telephone Encounter (Signed)
We need to figure out why she is seeing Dr Sabra Heck at [facility] also (that's another PCP).  She needs to pick an office for primary care.  Also, if FMLA needs to be extended related to menorrhagia and ablation, she will need to get GYN to complete this for her.

## 2020-09-29 DIAGNOSIS — I890 Lymphedema, not elsewhere classified: Secondary | ICD-10-CM | POA: Diagnosis not present

## 2020-10-01 DIAGNOSIS — N920 Excessive and frequent menstruation with regular cycle: Secondary | ICD-10-CM | POA: Diagnosis not present

## 2020-10-02 ENCOUNTER — Telehealth (INDEPENDENT_AMBULATORY_CARE_PROVIDER_SITE_OTHER): Payer: Federal, State, Local not specified - PPO | Admitting: Family Medicine

## 2020-10-02 ENCOUNTER — Encounter: Payer: Self-pay | Admitting: Family Medicine

## 2020-10-02 DIAGNOSIS — N920 Excessive and frequent menstruation with regular cycle: Secondary | ICD-10-CM | POA: Diagnosis not present

## 2020-10-02 DIAGNOSIS — F4322 Adjustment disorder with anxiety: Secondary | ICD-10-CM

## 2020-10-02 NOTE — Assessment & Plan Note (Signed)
Multiple stressors have contributed to this including chronic illness of her dog, stress at work, recent illness requiring work-up from from multiple specialists, and murder of her cousin recently She is slowly improving and has stabilized She will continue working with therapy as this has been very helpful She is currently on FMLA due to anxiety and set to return to work soon She did not tolerate Celexa well She has not tried Zoloft after it was prescribed last time She can use hydroxyzine as needed Contracted for safety-no SI/HI F/u at CPE in 3 months and repeat PHQ-9 and GAD-7

## 2020-10-02 NOTE — Progress Notes (Signed)
MyChart Video Visit    Virtual Visit via Video Note   This visit type was conducted due to national recommendations for restrictions regarding the COVID-19 Pandemic (e.g. social distancing) in an effort to limit this patient's exposure and mitigate transmission in our community. This patient is at least at moderate risk for complications without adequate follow up. This format is felt to be most appropriate for this patient at this time. Physical exam was limited by quality of the video and audio technology used for the visit.    Patient location: home Provider location: [facility] Persons involved in the visit: patient, provider  I discussed the limitations of evaluation and management by telemedicine and the availability of in person appointments. The patient expressed understanding and agreed to proceed.  Patient: Brenda Ross   DOB: Feb 18, 1976   44 y.o. Female  MRN: [id number] Visit Date: 10/02/2020  Today's healthcare provider: Shirlee Latch, MD   Chief Complaint  Patient presents with  . Anxiety   Subjective    HPI  Anxiety, Follow-up  She was last seen for anxiety 1 months ago. Changes made at last visit include changing for citalopram to Zoloft 50mg , and use hydroxyzine as needed.  She did not tolerate Celexa due to body aches, GI upset Did not start Zoloft and would like to avoid if possible  Had uterine ablation last night and feeling sluggish.     She reports poor compliance with treatment.  She feels her anxiety is moderate and Unchanged since last visit.   GAD-7 Results GAD-7 Generalized Anxiety Disorder Screening Tool 10/02/2020 08/08/2020  1. Feeling Nervous, Anxious, or on Edge 3 3  2. Not Being Able to Stop or Control Worrying 1 3  3. Worrying Too Much About Different Things 3 3  4. Trouble Relaxing 1 3  5. Being So Restless it's Hard To Sit Still 3 3  6. Becoming Easily Annoyed or Irritable 3 3  7. Feeling Afraid As If  Something Awful Might Happen 3 3  Total GAD-7 Score 17 21  Difficulty At Work, Home, or Getting  Along With Others? Somewhat difficult Extremely difficult    PHQ-9 Scores PHQ9 SCORE ONLY 10/02/2020 09/02/2020 08/08/2020  PHQ-9 Total Score 6 8 19     ---------------------------------------------------------------------------------------------------   Social History   Tobacco Use  . Smoking status: Former Smoker    Packs/day: 1.00    Years: 3.00    Pack years: 3.00    Types: Cigarettes    Quit date: 01/14/1996    Years since quitting: 24.7  . Smokeless tobacco: Never Used  Vaping Use  . Vaping Use: Never used  Substance Use Topics  . Alcohol use: Yes    Alcohol/week: 7.0 - 14.0 standard drinks    Types: 7 - 14 Glasses of wine per week  . Drug use: Never      Medications: Outpatient Medications Prior to Visit  Medication Sig  . ACIDOPHILUS LACTOBACILLUS PO Take by mouth.  [name] Kitchen aspirin EC 81 MG tablet Take 81 mg by mouth daily. Swallow whole.  . Biotin 1000 MCG CHEW Chew by mouth.  . Cholecalciferol 50 MCG (2000 UT) TABS Take by mouth.  . diltiazem (TIAZAC) 120 MG 24 hr capsule Take 120 mg by mouth daily.  [name] Kitchen EPINEPHrine 0.3 mg/0.3 mL IJ SOAJ injection Inject 0.3 mLs (0.3 mg total) into the muscle as needed for anaphylaxis.  [name] Kitchen ergocalciferol (VITAMIN D2) 1.25 MG (50000 UT) capsule Take 1 capsule (50,000 Units total) by  mouth once a week.  . famotidine (PEPCID) 40 MG tablet   . hydrOXYzine (ATARAX/VISTARIL) 10 MG tablet Take 1 tablet (10 mg total) by mouth 3 (three) times daily as needed for anxiety.  [name] Kitchen losartan (COZAAR) 100 MG tablet Take 1 tablet by mouth daily.  . magnesium oxide (MAG-OX) 400 MG tablet Take 400 mg by mouth daily.  . montelukast (SINGULAIR) 10 MG tablet Take 1 tablet (10 mg total) by mouth daily.  . Multiple Vitamins-Minerals (MULTIVITAMIN ADULT, MINERALS,) TABS Take by mouth.  . pantoprazole (PROTONIX) 40 MG tablet Take 1 tablet by mouth 2 (two) times daily.   [name] Kitchen thiamine (VITAMIN B-1) 50 MG tablet Take 50 mg by mouth daily.  . valACYclovir (VALTREX) 500 MG tablet Take by mouth.  . [DISCONTINUED] sertraline (ZOLOFT) 50 MG tablet Take 1 tablet (50 mg total) by mouth daily.   No facility-administered medications prior to visit.    Review of Systems  Constitutional: Negative.   Respiratory: Negative.   Cardiovascular: Negative.   Neurological: Negative.   Psychiatric/Behavioral: Negative for suicidal ideas. The patient is nervous/anxious.        Objective    There were no vitals taken for this visit. BP Readings from Last 3 Encounters:  09/12/20 124/82  09/02/20 138/77  08/26/20 (!) 154/80   Wt Readings from Last 3 Encounters:  09/02/20 288 lb (130.6 kg)  08/26/20 288 lb (130.6 kg)  08/14/20 285 lb (129.3 kg)      Physical Exam Constitutional:      General: She is not in acute distress.    Appearance: Normal appearance. She is not diaphoretic.  HENT:     Head: Normocephalic and atraumatic.  Pulmonary:     Effort: Pulmonary effort is normal. No respiratory distress.  Neurological:     Mental Status: She is alert and oriented to person, place, and time. Mental status is at baseline.  Psychiatric:        Mood and Affect: Mood normal.        Behavior: Behavior normal.        Assessment & Plan     Problem List Items Addressed This Visit      Other   Adjustment disorder with anxious mood - Primary    Multiple stressors have contributed to this including chronic illness of her dog, stress at work, recent illness requiring work-up from from multiple specialists, and murder of her cousin recently She is slowly improving and has stabilized She will continue working with therapy as this has been very helpful She is currently on FMLA due to anxiety and set to return to work soon She did not tolerate Celexa well She has not tried Zoloft after it was prescribed last time She can use hydroxyzine as needed Contracted for  safety-no SI/HI F/u at CPE in 3 months and repeat PHQ-9 and GAD-7          Return in about 3 months (around 01/02/2021) for CPE.     I discussed the assessment and treatment plan with the patient. The patient was provided an opportunity to ask questions and all were answered. The patient agreed with the plan and demonstrated an understanding of the instructions.   The patient was advised to call back or seek an in-person evaluation if the symptoms worsen or if the condition fails to improve as anticipated.  I provided 20 minutes of non-face-to-face time during this encounter.  I, Shirlee Latch, MD, have reviewed all documentation for this visit. The documentation on  10/02/20 for the exam, diagnosis, procedures, and orders are all accurate and complete.   Plummer Matich, Marzella Schlein, MD, MPH [facility]

## 2020-10-03 ENCOUNTER — Other Ambulatory Visit: Payer: Self-pay

## 2020-10-03 ENCOUNTER — Telehealth: Payer: Self-pay | Admitting: Family Medicine

## 2020-10-03 ENCOUNTER — Encounter: Payer: Self-pay | Admitting: Family Medicine

## 2020-10-03 ENCOUNTER — Telehealth (INDEPENDENT_AMBULATORY_CARE_PROVIDER_SITE_OTHER): Payer: Federal, State, Local not specified - PPO | Admitting: Family Medicine

## 2020-10-03 DIAGNOSIS — L509 Urticaria, unspecified: Secondary | ICD-10-CM

## 2020-10-03 NOTE — Progress Notes (Signed)
MyChart Video Visit    Virtual Visit via Video Note   This visit type was conducted due to national recommendations for restrictions regarding the COVID-19 Pandemic (e.g. social distancing) in an effort to limit this patient's exposure and mitigate transmission in our community. This patient is at least at moderate risk for complications without adequate follow up. This format is felt to be most appropriate for this patient at this time. Physical exam was limited by quality of the video and audio technology used for the visit.   Patient location: Home Provider location: Office  I discussed the limitations of evaluation and management by telemedicine and the availability of in person appointments. The patient expressed understanding and agreed to proceed.  Patient: Brenda Ross   DOB: [birth year]   44 y.o. Female  MRN: [id number] Visit Date: 10/03/2020  Today's healthcare provider: Vernie Murders, PA   Chief Complaint  Patient presents with  . Allergic Reaction   Subjective    HPI  This 44 year old female had a uterine ablation procedure on the evening of 10-01-20. Was not sure a contrast media used did not have iodine in it. Last night (10-02-20) she had a steak dinner and had the same hives on her face as when she had a reaction to contrast with a CT scan in the past. At 8:20 pm she took some Benadryl with slight help with symptoms. She then took some Pepcid (H2 Blocker) and all the symptoms resolved. States she has had allergy testing in the past showing problems with pollens and molds. Also, has a history of hives eating apples, although no food allergies were found by the allergist in the past.   Concerned she may have a meat allergy (Alpha-Gal Syndrome) with her husband's job exposing her to ticks (states he has had RMSF and Lyme's disease in the past). Also she has a history of Celiac Disease and follows a gluten-free diet. Past Medical History:  Diagnosis Date  .  Allergic rhinitis   . Anemia   . Anxiety   . GERD (gastroesophageal reflux disease)   . Herpes genitalia   . Prediabetes   . Sjogren's disease ([place])   . Sleep apnea    Past Surgical History:  Procedure Laterality Date  . APPENDECTOMY     Family History  Problem Relation Age of Onset  . Diabetes Mother   . Hypertension Mother   . Heart disease Mother   . Allergic rhinitis Mother   . Hypertension Father   . Heart disease Father   . Prostate cancer Father   . Diabetes Father   . Diabetes Brother   . ADD / ADHD Brother   . Asthma Brother   . Allergic rhinitis Brother   . ADD / ADHD Brother   . Allergic rhinitis Brother   . Allergic rhinitis Brother   . Asthma Maternal Grandmother   . Breast cancer Neg Hx   . Colon cancer Neg Hx   . Ovarian cancer Neg Hx    Social History   Tobacco Use  . Smoking status: Former Smoker    Packs/day: 1.00    Years: 3.00    Pack years: 3.00    Types: Cigarettes    Quit date: 01/14/1996    Years since quitting: 24.7  . Smokeless tobacco: Never Used  Vaping Use  . Vaping Use: Never used  Substance Use Topics  . Alcohol use: Yes    Alcohol/week: 7.0 - 14.0 standard drinks  Types: 7 - 14 Glasses of wine per week  . Drug use: Never    Medications: Outpatient Medications Prior to Visit  Medication Sig  . ACIDOPHILUS LACTOBACILLUS PO Take by mouth.  [name] Kitchen aspirin EC 81 MG tablet Take 81 mg by mouth daily. Swallow whole.  . Biotin 1000 MCG CHEW Chew by mouth.  . Cholecalciferol 50 MCG (2000 UT) TABS Take by mouth.  . diltiazem (TIAZAC) 120 MG 24 hr capsule Take 120 mg by mouth daily.  [name] Kitchen EPINEPHrine 0.3 mg/0.3 mL IJ SOAJ injection Inject 0.3 mLs (0.3 mg total) into the muscle as needed for anaphylaxis.  [name] Kitchen ergocalciferol (VITAMIN D2) 1.25 MG (50000 UT) capsule Take 1 capsule (50,000 Units total) by mouth once a week.  . famotidine (PEPCID) 40 MG tablet   . hydrOXYzine (ATARAX/VISTARIL) 10 MG tablet Take 1 tablet (10 mg total) by mouth 3  (three) times daily as needed for anxiety.  [name] Kitchen losartan (COZAAR) 100 MG tablet Take 1 tablet by mouth daily.  . magnesium oxide (MAG-OX) 400 MG tablet Take 400 mg by mouth daily.  . montelukast (SINGULAIR) 10 MG tablet Take 1 tablet (10 mg total) by mouth daily.  . Multiple Vitamins-Minerals (MULTIVITAMIN ADULT, MINERALS,) TABS Take by mouth.  . pantoprazole (PROTONIX) 40 MG tablet Take 1 tablet by mouth 2 (two) times daily.  [name] Kitchen thiamine (VITAMIN B-1) 50 MG tablet Take 50 mg by mouth daily.  . valACYclovir (VALTREX) 500 MG tablet Take by mouth.   No facility-administered medications prior to visit.    Review of Systems  Constitutional: Negative.   HENT: Negative.   Eyes: Negative.   Respiratory: Negative.   Cardiovascular: Negative.   Skin:       Episodic bouts of hives and itching. None today.       Objective    There were no vitals taken for this visit.    Physical Exam: WDWN female in no apparent distress.  Head: Normocephalic, atraumatic. Neck: Supple, NROM Respiratory: No apparent distress Psych: Normal mood and affect Skin: No apparent hives or swelling of face     Assessment & Plan     1. Hives Had hives last night after a steak dinner and worried about a meat allergy. Will check for Alpha-Gal Syndrome. Advised to keep Benadryl, Pepcid and Epinephrine handy. Should go to the ER if symptoms recur and are persistent. She should contact her allergist for more information and recommendations. Worried about Mast Cell Syndrome. - CBC with Differential/Platelet - Alpha-Gal Panel   No follow-ups on file.     I discussed the assessment and treatment plan with the patient. The patient was provided an opportunity to ask questions and all were answered. The patient agreed with the plan and demonstrated an understanding of the instructions.   The patient was advised to call back or seek an in-person evaluation if the symptoms worsen or if the condition fails to improve as  anticipated.  I provided 30 minutes of non-face-to-face time during this encounter.  Andres Shad, PA, have reviewed all documentation for this visit. The documentation on 10/03/20 for the exam, diagnosis, procedures, and orders are all accurate and complete.   Vernie Murders, [place] [phone] (phone) [phone] (fax)  [place]

## 2020-10-03 NOTE — Telephone Encounter (Signed)
Please advise 

## 2020-10-03 NOTE — Telephone Encounter (Signed)
Patient would like to be referred to Abbey Chatters at [facility] rheumatology, allergy and immunology.  Phone number for the office [phone] address is [address] [place] [address]

## 2020-10-03 NOTE — Telephone Encounter (Signed)
Proceed with above referral for hives and allergy symptoms.

## 2020-10-06 ENCOUNTER — Encounter: Payer: Self-pay | Admitting: Family Medicine

## 2020-10-06 DIAGNOSIS — R202 Paresthesia of skin: Secondary | ICD-10-CM | POA: Diagnosis not present

## 2020-10-07 ENCOUNTER — Other Ambulatory Visit: Payer: Self-pay | Admitting: Family Medicine

## 2020-10-07 DIAGNOSIS — M792 Neuralgia and neuritis, unspecified: Secondary | ICD-10-CM | POA: Diagnosis not present

## 2020-10-07 DIAGNOSIS — E538 Deficiency of other specified B group vitamins: Secondary | ICD-10-CM | POA: Diagnosis not present

## 2020-10-07 DIAGNOSIS — L509 Urticaria, unspecified: Secondary | ICD-10-CM

## 2020-10-07 DIAGNOSIS — M3500 Sicca syndrome, unspecified: Secondary | ICD-10-CM | POA: Diagnosis not present

## 2020-10-07 DIAGNOSIS — F331 Major depressive disorder, recurrent, moderate: Secondary | ICD-10-CM | POA: Diagnosis not present

## 2020-10-13 NOTE — Progress Notes (Signed)
Will send back to provider that saw her for this. Looks like she wants rx for iron supplement.

## 2020-10-14 ENCOUNTER — Telehealth: Payer: Self-pay

## 2020-10-14 DIAGNOSIS — G4733 Obstructive sleep apnea (adult) (pediatric): Secondary | ICD-10-CM | POA: Diagnosis not present

## 2020-10-14 DIAGNOSIS — F331 Major depressive disorder, recurrent, moderate: Secondary | ICD-10-CM | POA: Diagnosis not present

## 2020-10-14 NOTE — Telephone Encounter (Signed)
Pt advised.  She would like to get her labs rechecked before starting a Multivitamin.  She would like to see where are numbers are.  She is requesting B12, CBC, and Iron panel, plus anything you think should also be checked.  She states if her B12 is still low she would like to proceed with B12 injections since she does not tolerate oral supplements.  Please advise pt when lab sheet is ready.   Thanks,   -Mickel Baas

## 2020-10-14 NOTE — Telephone Encounter (Signed)
-----   Message from Virginia Crews, MD sent at 10/14/2020  8:24 AM EDT ----- We would need to check B12 levels.  Also, no iron panel since 2019.  MCV is actually normal and improving over last year.  May not be iron deficiency.  Try multivitamin if able, but if not, recheck CBC with Vit B12 and iron panel in 2 months.

## 2020-10-15 ENCOUNTER — Encounter: Payer: Self-pay | Admitting: Family Medicine

## 2020-10-15 LAB — ALPHA-GAL PANEL
Alpha Gal IgE*: <0.1 kU/L (ref <0.10)
Beef (Bos spp) IgE: <0.1 kU/L (ref <0.35)
Class Interpretation: 0
Class Interpretation: 0
Class Interpretation: 0
Lamb/Mutton (Ovis spp) IgE: <0.1 kU/L (ref <0.35)
Pork (Sus spp) IgE: <0.1 kU/L (ref <0.35)

## 2020-10-15 LAB — CBC WITH DIFFERENTIAL/PLATELET
Basophils Absolute: 0 10*3/uL (ref 0.0–0.2)
Basos: 0 %
EOS (ABSOLUTE): 0.2 10*3/uL (ref 0.0–0.4)
Eos: 3 %
Hematocrit: 35.3 % (ref 34.0–46.6)
Hemoglobin: 10.8 g/dL — ABNORMAL LOW (ref 11.1–15.9)
Immature Grans (Abs): 0 10*3/uL (ref 0.0–0.1)
Immature Granulocytes: 0 %
Lymphocytes Absolute: 2.4 10*3/uL (ref 0.7–3.1)
Lymphs: 31 %
MCH: 26.5 pg — ABNORMAL LOW (ref 26.6–33.0)
MCHC: 30.6 g/dL — ABNORMAL LOW (ref 31.5–35.7)
MCV: 87 fL (ref 79–97)
Monocytes Absolute: 0.3 10*3/uL (ref 0.1–0.9)
Monocytes: 4 %
Neutrophils Absolute: 4.8 10*3/uL (ref 1.4–7.0)
Neutrophils: 62 %
Platelets: 292 10*3/uL (ref 150–450)
RBC: 4.08 x10E6/uL (ref 3.77–5.28)
RDW: 12.7 % (ref 11.7–15.4)
WBC: 7.8 10*3/uL (ref 3.4–10.8)

## 2020-10-15 NOTE — Telephone Encounter (Signed)
Please review.  I think she is also still seeing a Primary at [facility].  There is a phone message to them dated (10/14/2020) with her requesting to see another provider from that location.  Do you still want to proceed with the blood work she is requesting or have her new provider order it?  Thanks,   -Mickel Baas

## 2020-10-16 NOTE — Telephone Encounter (Signed)
See last note. Of course, we do need to be available for acute issues for 30 days until she is established with new PCP, but it looks like they are already calling to schedule and she has established with them already while still seeing me.

## 2020-10-16 NOTE — Telephone Encounter (Signed)
Looks like patient has chosen to go ahead and see someone at [facility] for primary care (Dr Tressia Miners).  She can have labs done with them.  We are no longer her primary care office.

## 2020-10-21 DIAGNOSIS — F331 Major depressive disorder, recurrent, moderate: Secondary | ICD-10-CM | POA: Diagnosis not present

## 2020-10-21 DIAGNOSIS — F411 Generalized anxiety disorder: Secondary | ICD-10-CM | POA: Diagnosis not present

## 2020-10-28 DIAGNOSIS — F411 Generalized anxiety disorder: Secondary | ICD-10-CM | POA: Diagnosis not present

## 2020-10-28 DIAGNOSIS — F331 Major depressive disorder, recurrent, moderate: Secondary | ICD-10-CM | POA: Diagnosis not present

## 2020-11-11 DIAGNOSIS — M5412 Radiculopathy, cervical region: Secondary | ICD-10-CM | POA: Diagnosis not present

## 2020-11-11 DIAGNOSIS — F411 Generalized anxiety disorder: Secondary | ICD-10-CM | POA: Diagnosis not present

## 2020-11-11 DIAGNOSIS — E538 Deficiency of other specified B group vitamins: Secondary | ICD-10-CM | POA: Diagnosis not present

## 2020-11-11 DIAGNOSIS — F331 Major depressive disorder, recurrent, moderate: Secondary | ICD-10-CM | POA: Diagnosis not present

## 2020-11-11 DIAGNOSIS — E611 Iron deficiency: Secondary | ICD-10-CM | POA: Diagnosis not present

## 2020-11-11 DIAGNOSIS — E559 Vitamin D deficiency, unspecified: Secondary | ICD-10-CM | POA: Diagnosis not present

## 2020-11-11 DIAGNOSIS — R200 Anesthesia of skin: Secondary | ICD-10-CM | POA: Diagnosis not present

## 2020-11-11 DIAGNOSIS — R202 Paresthesia of skin: Secondary | ICD-10-CM | POA: Diagnosis not present

## 2020-11-13 ENCOUNTER — Other Ambulatory Visit: Payer: Self-pay | Admitting: Neurology

## 2020-11-13 DIAGNOSIS — G8929 Other chronic pain: Secondary | ICD-10-CM

## 2020-11-13 DIAGNOSIS — M545 Low back pain, unspecified: Secondary | ICD-10-CM

## 2020-11-18 DIAGNOSIS — F411 Generalized anxiety disorder: Secondary | ICD-10-CM | POA: Diagnosis not present

## 2020-11-18 DIAGNOSIS — F331 Major depressive disorder, recurrent, moderate: Secondary | ICD-10-CM | POA: Diagnosis not present

## 2020-11-21 DIAGNOSIS — I10 Essential (primary) hypertension: Secondary | ICD-10-CM | POA: Diagnosis not present

## 2020-11-21 DIAGNOSIS — M545 Low back pain, unspecified: Secondary | ICD-10-CM | POA: Diagnosis not present

## 2020-11-21 DIAGNOSIS — M3500 Sicca syndrome, unspecified: Secondary | ICD-10-CM | POA: Diagnosis not present

## 2020-11-21 DIAGNOSIS — K900 Celiac disease: Secondary | ICD-10-CM | POA: Diagnosis not present

## 2020-11-21 DIAGNOSIS — R7303 Prediabetes: Secondary | ICD-10-CM | POA: Diagnosis not present

## 2020-11-25 DIAGNOSIS — F411 Generalized anxiety disorder: Secondary | ICD-10-CM | POA: Diagnosis not present

## 2020-11-25 DIAGNOSIS — F331 Major depressive disorder, recurrent, moderate: Secondary | ICD-10-CM | POA: Diagnosis not present

## 2020-12-02 ENCOUNTER — Other Ambulatory Visit: Payer: Self-pay | Admitting: Family Medicine

## 2020-12-02 DIAGNOSIS — F411 Generalized anxiety disorder: Secondary | ICD-10-CM | POA: Diagnosis not present

## 2020-12-02 DIAGNOSIS — F331 Major depressive disorder, recurrent, moderate: Secondary | ICD-10-CM | POA: Diagnosis not present

## 2020-12-02 DIAGNOSIS — F4322 Adjustment disorder with anxiety: Secondary | ICD-10-CM

## 2020-12-03 DIAGNOSIS — M5441 Lumbago with sciatica, right side: Secondary | ICD-10-CM | POA: Diagnosis not present

## 2020-12-03 DIAGNOSIS — G8929 Other chronic pain: Secondary | ICD-10-CM | POA: Diagnosis not present

## 2020-12-03 DIAGNOSIS — M5442 Lumbago with sciatica, left side: Secondary | ICD-10-CM | POA: Diagnosis not present

## 2020-12-15 DIAGNOSIS — M79672 Pain in left foot: Secondary | ICD-10-CM | POA: Diagnosis not present

## 2020-12-15 DIAGNOSIS — M79671 Pain in right foot: Secondary | ICD-10-CM | POA: Diagnosis not present

## 2020-12-15 DIAGNOSIS — M3500 Sicca syndrome, unspecified: Secondary | ICD-10-CM | POA: Diagnosis not present

## 2020-12-15 DIAGNOSIS — M2550 Pain in unspecified joint: Secondary | ICD-10-CM | POA: Diagnosis not present

## 2020-12-16 DIAGNOSIS — F411 Generalized anxiety disorder: Secondary | ICD-10-CM | POA: Diagnosis not present

## 2020-12-16 DIAGNOSIS — F331 Major depressive disorder, recurrent, moderate: Secondary | ICD-10-CM | POA: Diagnosis not present

## 2020-12-17 DIAGNOSIS — M5127 Other intervertebral disc displacement, lumbosacral region: Secondary | ICD-10-CM | POA: Diagnosis not present

## 2020-12-17 DIAGNOSIS — M48061 Spinal stenosis, lumbar region without neurogenic claudication: Secondary | ICD-10-CM | POA: Diagnosis not present

## 2020-12-17 DIAGNOSIS — M47817 Spondylosis without myelopathy or radiculopathy, lumbosacral region: Secondary | ICD-10-CM | POA: Diagnosis not present

## 2020-12-22 DIAGNOSIS — G4733 Obstructive sleep apnea (adult) (pediatric): Secondary | ICD-10-CM | POA: Diagnosis not present

## 2020-12-23 DIAGNOSIS — F411 Generalized anxiety disorder: Secondary | ICD-10-CM | POA: Diagnosis not present

## 2020-12-23 DIAGNOSIS — M5441 Lumbago with sciatica, right side: Secondary | ICD-10-CM | POA: Diagnosis not present

## 2020-12-23 DIAGNOSIS — F331 Major depressive disorder, recurrent, moderate: Secondary | ICD-10-CM | POA: Diagnosis not present

## 2020-12-23 DIAGNOSIS — G8929 Other chronic pain: Secondary | ICD-10-CM | POA: Diagnosis not present

## 2020-12-23 DIAGNOSIS — M5442 Lumbago with sciatica, left side: Secondary | ICD-10-CM | POA: Diagnosis not present

## 2020-12-31 DIAGNOSIS — F411 Generalized anxiety disorder: Secondary | ICD-10-CM | POA: Diagnosis not present

## 2020-12-31 DIAGNOSIS — F331 Major depressive disorder, recurrent, moderate: Secondary | ICD-10-CM | POA: Diagnosis not present

## 2021-01-01 DIAGNOSIS — M7672 Peroneal tendinitis, left leg: Secondary | ICD-10-CM | POA: Diagnosis not present

## 2021-01-01 DIAGNOSIS — M3500 Sicca syndrome, unspecified: Secondary | ICD-10-CM | POA: Diagnosis not present

## 2021-01-01 DIAGNOSIS — M7671 Peroneal tendinitis, right leg: Secondary | ICD-10-CM | POA: Diagnosis not present

## 2021-01-01 DIAGNOSIS — M79672 Pain in left foot: Secondary | ICD-10-CM | POA: Diagnosis not present

## 2021-01-01 DIAGNOSIS — M79671 Pain in right foot: Secondary | ICD-10-CM | POA: Diagnosis not present

## 2021-01-06 DIAGNOSIS — F411 Generalized anxiety disorder: Secondary | ICD-10-CM | POA: Diagnosis not present

## 2021-01-06 DIAGNOSIS — F331 Major depressive disorder, recurrent, moderate: Secondary | ICD-10-CM | POA: Diagnosis not present

## 2021-01-12 DIAGNOSIS — R413 Other amnesia: Secondary | ICD-10-CM | POA: Diagnosis not present

## 2021-01-14 DIAGNOSIS — G4733 Obstructive sleep apnea (adult) (pediatric): Secondary | ICD-10-CM | POA: Diagnosis not present

## 2021-01-20 DIAGNOSIS — F411 Generalized anxiety disorder: Secondary | ICD-10-CM | POA: Diagnosis not present

## 2021-01-20 DIAGNOSIS — F331 Major depressive disorder, recurrent, moderate: Secondary | ICD-10-CM | POA: Diagnosis not present

## 2021-01-23 DIAGNOSIS — M3500 Sicca syndrome, unspecified: Secondary | ICD-10-CM | POA: Diagnosis not present

## 2021-01-27 DIAGNOSIS — F411 Generalized anxiety disorder: Secondary | ICD-10-CM | POA: Diagnosis not present

## 2021-01-27 DIAGNOSIS — F331 Major depressive disorder, recurrent, moderate: Secondary | ICD-10-CM | POA: Diagnosis not present

## 2021-01-28 DIAGNOSIS — H16223 Keratoconjunctivitis sicca, not specified as Sjogren's, bilateral: Secondary | ICD-10-CM | POA: Diagnosis not present

## 2021-01-30 DIAGNOSIS — R413 Other amnesia: Secondary | ICD-10-CM | POA: Diagnosis not present

## 2021-02-06 DIAGNOSIS — F331 Major depressive disorder, recurrent, moderate: Secondary | ICD-10-CM | POA: Diagnosis not present

## 2021-02-06 DIAGNOSIS — F411 Generalized anxiety disorder: Secondary | ICD-10-CM | POA: Diagnosis not present

## 2021-02-06 DIAGNOSIS — F900 Attention-deficit hyperactivity disorder, predominantly inattentive type: Secondary | ICD-10-CM | POA: Diagnosis not present

## 2021-02-10 DIAGNOSIS — F900 Attention-deficit hyperactivity disorder, predominantly inattentive type: Secondary | ICD-10-CM | POA: Diagnosis not present

## 2021-02-10 DIAGNOSIS — F411 Generalized anxiety disorder: Secondary | ICD-10-CM | POA: Diagnosis not present

## 2021-02-10 DIAGNOSIS — F331 Major depressive disorder, recurrent, moderate: Secondary | ICD-10-CM | POA: Diagnosis not present

## 2021-02-13 DIAGNOSIS — M79604 Pain in right leg: Secondary | ICD-10-CM | POA: Diagnosis not present

## 2021-02-13 DIAGNOSIS — J029 Acute pharyngitis, unspecified: Secondary | ICD-10-CM | POA: Diagnosis not present

## 2021-02-13 DIAGNOSIS — M3500 Sicca syndrome, unspecified: Secondary | ICD-10-CM | POA: Diagnosis not present

## 2021-02-13 DIAGNOSIS — J028 Acute pharyngitis due to other specified organisms: Secondary | ICD-10-CM | POA: Diagnosis not present

## 2021-02-17 DIAGNOSIS — F900 Attention-deficit hyperactivity disorder, predominantly inattentive type: Secondary | ICD-10-CM | POA: Diagnosis not present

## 2021-02-17 DIAGNOSIS — F411 Generalized anxiety disorder: Secondary | ICD-10-CM | POA: Diagnosis not present

## 2021-02-17 DIAGNOSIS — F331 Major depressive disorder, recurrent, moderate: Secondary | ICD-10-CM | POA: Diagnosis not present

## 2021-02-18 DIAGNOSIS — K219 Gastro-esophageal reflux disease without esophagitis: Secondary | ICD-10-CM | POA: Diagnosis not present

## 2021-02-19 ENCOUNTER — Ambulatory Visit (INDEPENDENT_AMBULATORY_CARE_PROVIDER_SITE_OTHER): Payer: Federal, State, Local not specified - PPO | Admitting: Vascular Surgery

## 2021-02-20 DIAGNOSIS — F419 Anxiety disorder, unspecified: Secondary | ICD-10-CM | POA: Diagnosis not present

## 2021-02-20 DIAGNOSIS — R413 Other amnesia: Secondary | ICD-10-CM | POA: Diagnosis not present

## 2021-02-23 ENCOUNTER — Ambulatory Visit (INDEPENDENT_AMBULATORY_CARE_PROVIDER_SITE_OTHER): Payer: Federal, State, Local not specified - PPO | Admitting: Vascular Surgery

## 2021-02-23 ENCOUNTER — Encounter (INDEPENDENT_AMBULATORY_CARE_PROVIDER_SITE_OTHER): Payer: Self-pay | Admitting: Vascular Surgery

## 2021-02-23 ENCOUNTER — Other Ambulatory Visit: Payer: Self-pay

## 2021-02-23 VITALS — BP 123/81 | HR 57 | Ht 64.0 in | Wt 277.0 lb

## 2021-02-23 DIAGNOSIS — I890 Lymphedema, not elsewhere classified: Secondary | ICD-10-CM | POA: Diagnosis not present

## 2021-02-23 DIAGNOSIS — I10 Essential (primary) hypertension: Secondary | ICD-10-CM | POA: Diagnosis not present

## 2021-02-23 DIAGNOSIS — I872 Venous insufficiency (chronic) (peripheral): Secondary | ICD-10-CM | POA: Diagnosis not present

## 2021-02-23 NOTE — Progress Notes (Signed)
MRN : [id number]  Brenda Ross is a 45 y.o. (12/19/1975) female who presents with chief complaint of No chief complaint on file. [name] Kitchen  History of Present Illness:   The patient returns to the office for followup evaluation regarding leg swelling.  The swelling has persisted and the pain associated with swelling continues. There have not been any interval development of a ulcerations or wounds.  Since the previous visit the patient has been wearing graduated compression stockings and has noted little if any improvement in the lymphedema. The patient has been using compression routinely morning until night.  The patient also states elevation during the day and exercise is being done too.   The right ABI is 1.28 with a left ABI of 1.22.  The patient has good toe waveforms bilaterally with triphasic tibial artery waveforms bilaterally.  There is no evidence of DVT or superficial venous thrombosis bilaterally.  No evidence of deep venous insufficiency seen in the left lower extremity.  The right lower extremity has evidence of deep venous insufficiency in the popliteal vein with reflux in the great saphenous vein at the knee.   No outpatient medications have been marked as taking for the 02/23/21 encounter (Appointment) with Delana Meyer, Dolores Lory, MD.    Past Medical History:  Diagnosis Date   Allergic rhinitis    Anemia    Anxiety    GERD (gastroesophageal reflux disease)    Herpes genitalia    Prediabetes    Sjogren's disease ([place])    Sleep apnea     Past Surgical History:  Procedure Laterality Date   APPENDECTOMY      Social History Social History   Tobacco Use   Smoking status: Former Smoker    Packs/day: 1.00    Years: 3.00    Pack years: 3.00    Types: Cigarettes    Quit date: 01/14/1996    Years since quitting: 25.1   Smokeless tobacco: Never Used  Vaping Use   Vaping Use: Never used  Substance Use Topics   Alcohol use: Yes    Alcohol/week: 7.0 -  14.0 standard drinks    Types: 7 - 14 Glasses of wine per week   Drug use: Never    Family History Family History  Problem Relation Age of Onset   Diabetes Mother    Hypertension Mother    Heart disease Mother    Allergic rhinitis Mother    Hypertension Father    Heart disease Father    Prostate cancer Father    Diabetes Father    Diabetes Brother    ADD / ADHD Brother    Asthma Brother    Allergic rhinitis Brother    ADD / ADHD Brother    Allergic rhinitis Brother    Allergic rhinitis Brother    Asthma Maternal Grandmother    Breast cancer Neg Hx    Colon cancer Neg Hx    Ovarian cancer Neg Hx     Allergies  Allergen Reactions   Iodinated Diagnostic Agents Hives, Shortness Of Breath, Swelling and Anaphylaxis   Citalopram Other (See Comments)    Nausea, myalgias   Corn-Containing Products     Other reaction(s): Other (See Comments) Is sensitive to this   Eggs Or Egg-Derived Products Diarrhea   Gluten Meal Diarrhea    Celiac's disease   Molds & Smuts Hives   Pea Diarrhea     REVIEW OF SYSTEMS (Negative unless checked)  Constitutional: [] Weight loss  [] Fever  [] Chills Cardiac: []   Chest pain   [] Chest pressure   [] Palpitations   [] Shortness of breath when laying flat   [] Shortness of breath with exertion. Vascular:  [] Pain in legs with walking   [x] Pain in legs at rest  [] History of DVT   [] Phlebitis   [x] Swelling in legs   [] Varicose veins   [] Non-healing ulcers Pulmonary:   [] Uses home oxygen   [] Productive cough   [] Hemoptysis   [] Wheeze  [] COPD   [] Asthma Neurologic:  [] Dizziness   [] Seizures   [] History of stroke   [] History of TIA  [] Aphasia   [] Vissual changes   [] Weakness or numbness in arm   [] Weakness or numbness in leg Musculoskeletal:   [] Joint swelling   [x] Joint pain   [x] Low back pain Hematologic:  [] Easy bruising  [] Easy bleeding   [] Hypercoagulable state   [] Anemic Gastrointestinal:  [] Diarrhea   [] Vomiting   [] Gastroesophageal reflux/heartburn   [] Difficulty swallowing. Genitourinary:  [] Chronic kidney disease   [] Difficult urination  [] Frequent urination   [] Blood in urine Skin:  [] Rashes   [] Ulcers  Psychological:  [] History of anxiety   []  History of major depression.  Physical Examination  There were no vitals filed for this visit. There is no height or weight on file to calculate BMI. Gen: WD/WN, NAD Head: [place]/AT, No temporalis wasting.  Ear/Nose/Throat: Hearing grossly intact, nares w/o erythema or drainage Eyes: PER, EOMI, sclera nonicteric.  Neck: Supple, no large masses.   Pulmonary:  Good air movement, no audible wheezing bilaterally, no use of accessory muscles.  Cardiac: RRR, no JVD Vascular: scattered varicosities present bilaterally.  Mild venous stasis changes to the legs bilaterally.  2+ soft pitting edema Vessel Right Left  Radial Palpable Palpable  PT Palpable Palpable  DP Palpable Palpable  Gastrointestinal: Non-distended. No guarding/no peritoneal signs.  Musculoskeletal: M/S 5/5 throughout.  No deformity or atrophy.  Neurologic: CN 2-12 intact. Symmetrical.  Speech is fluent. Motor exam as listed above. Psychiatric: Judgment intact, Mood & affect appropriate for pt's clinical situation. Dermatologic: Venous rashes no ulcers noted.  No changes consistent with cellulitis. Lymph : + lichenification or skin changes of chronic lymphedema.  CBC Lab Results  Component Value Date   WBC 7.8 10/03/2020   HGB 10.8 (L) 10/03/2020   HCT 35.3 10/03/2020   MCV 87 10/03/2020   PLT 292 10/03/2020    BMET    Component Value Date/Time   NA 141 08/06/2019 0918   K 4.2 08/06/2019 0918   CL 104 08/06/2019 0918   CO2 21 08/06/2019 0918   GLUCOSE 105 (H) 08/06/2019 0918   GLUCOSE 143 (H) 10/18/2018 2303   BUN 11 08/06/2019 0918   CREATININE 0.80 08/06/2019 0918   CALCIUM 9.2 08/06/2019 0918   GFRNONAA 90 08/06/2019 0918   GFRAA 104 08/06/2019 0918   CrCl cannot be  calculated (Patient's most recent lab result is older than the maximum 21 days allowed.).  COAG No results found for: INR, PROTIME  Radiology No results found.   Assessment/Plan 1. Lymphedema Recommend:  No surgery or intervention at this point in time.    I have reviewed my previous discussion with the patient regarding swelling and why it causes symptoms.  Patient will continue wearing graduated compression stockings class 1 (20-30 mmHg) on a daily basis. The patient will  beginning wearing the stockings first thing in the morning and removing them in the evening. The patient is instructed specifically not to sleep in the stockings.    In addition, behavioral modification including several  periods of elevation of the lower extremities during the day will be continued.  This was reviewed with the patient during the initial visit.  The patient will also continue routine exercise, especially walking on a daily basis as was discussed during the initial visit.    Despite conservative treatments including graduated compression therapy class 1 and behavioral modification including exercise and elevation the patient  has not obtained adequate control of the lymphedema.  The patient still has stage 3 lymphedema and therefore, I believe that a lymph pump should be added to improve the control of the patient's lymphedema.  Additionally, a lymph pump is warranted because it will reduce the risk of cellulitis and ulceration in the future.  Patient should follow-up in six months    2. Chronic venous insufficiency Recommend:  No surgery or intervention at this point in time.    I have reviewed my previous discussion with the patient regarding swelling and why it causes symptoms.  Patient will continue wearing graduated compression stockings class 1 (20-30 mmHg) on a daily basis. The patient will  beginning wearing the stockings first thing in the morning and removing them in the evening. The  patient is instructed specifically not to sleep in the stockings.    In addition, behavioral modification including several periods of elevation of the lower extremities during the day will be continued.  This was reviewed with the patient during the initial visit.  The patient will also continue routine exercise, especially walking on a daily basis as was discussed during the initial visit.    Despite conservative treatments including graduated compression therapy class 1 and behavioral modification including exercise and elevation the patient  has not obtained adequate control of the lymphedema.  The patient still has stage 3 lymphedema and therefore, I believe that a lymph pump should be added to improve the control of the patient's lymphedema.  Additionally, a lymph pump is warranted because it will reduce the risk of cellulitis and ulceration in the future.  Patient should follow-up in six months    3. Essential hypertension Continue antihypertensive medications as already ordered, these medications have been reviewed and there are no changes at this time.    Hortencia Pilar, MD  02/23/2021 12:05 PM

## 2021-02-24 DIAGNOSIS — F411 Generalized anxiety disorder: Secondary | ICD-10-CM | POA: Diagnosis not present

## 2021-02-24 DIAGNOSIS — F900 Attention-deficit hyperactivity disorder, predominantly inattentive type: Secondary | ICD-10-CM | POA: Diagnosis not present

## 2021-02-24 DIAGNOSIS — F331 Major depressive disorder, recurrent, moderate: Secondary | ICD-10-CM | POA: Diagnosis not present

## 2021-02-26 ENCOUNTER — Encounter: Payer: Self-pay | Admitting: Gastroenterology

## 2021-02-26 ENCOUNTER — Ambulatory Visit: Payer: Federal, State, Local not specified - PPO | Admitting: Gastroenterology

## 2021-02-26 ENCOUNTER — Other Ambulatory Visit: Payer: Self-pay

## 2021-02-26 VITALS — BP 137/86 | HR 67 | Temp 97.9°F | Ht 64.0 in | Wt 278.4 lb

## 2021-02-26 DIAGNOSIS — K2100 Gastro-esophageal reflux disease with esophagitis, without bleeding: Secondary | ICD-10-CM

## 2021-02-26 MED ORDER — FAMOTIDINE 40 MG PO TABS
40.0000 mg | ORAL_TABLET | Freq: Two times a day (BID) | ORAL | 6 refills | Status: DC
Start: 2021-02-26 — End: 2021-12-22

## 2021-02-26 MED ORDER — PANTOPRAZOLE SODIUM 40 MG PO TBEC: 40.0000 mg | DELAYED_RELEASE_TABLET | Freq: Two times a day (BID) | ORAL | 6 refills | Status: AC

## 2021-02-26 NOTE — Progress Notes (Signed)
Primary Care Physician: Gladstone Lighter, MD  Primary Gastroenterologist:  Dr. Lucilla Lame  Chief Complaint  Patient presents with  . Medication Refill  . Nausea    HPI: Brenda Ross is a 45 y.o. female here for medication refills.  The patient also reports that she feels like she is still having some acid breakthrough. The patient states that she has throat problems and although she has not had nausea in the last few months she does report that she does have intermittent nausea.  The patient states takes a PPI during the day and famotidine at night.  She reports that she has frequent tonsillitis and believes this may be caused by her reflux.  There is no report of any unexplained weight loss fevers chills nausea vomiting black stools or bloody stools. The patient does report that she has some feeling that food gets stuck on the way down and she is wondering if she may have a hiatal hernia.  Past Medical History:  Diagnosis Date  . Allergic rhinitis   . Anemia   . Anxiety   . GERD (gastroesophageal reflux disease)   . Herpes genitalia   . Prediabetes   . Sjogren's disease ([place])   . Sleep apnea     Current Outpatient Medications  Medication Sig Dispense Refill  . ACIDOPHILUS LACTOBACILLUS PO Take by mouth.    . Biotin 1000 MCG CHEW Chew by mouth.    . Cholecalciferol 50 MCG (2000 UT) TABS Take by mouth.    . diltiazem (CARDIZEM CD) 120 MG 24 hr capsule Take 120 mg by mouth daily.    [name] Kitchen EPINEPHrine 0.3 mg/0.3 mL IJ SOAJ injection Inject 0.3 mLs (0.3 mg total) into the muscle as needed for anaphylaxis. 2 each 1  . ergocalciferol (VITAMIN D2) 1.25 MG (50000 UT) capsule Take 1 capsule (50,000 Units total) by mouth once a week. 4 capsule 1  . losartan (COZAAR) 100 MG tablet Take 1 tablet by mouth daily.    . montelukast (SINGULAIR) 10 MG tablet Take 1 tablet (10 mg total) by mouth daily. 90 tablet 1  . thiamine (VITAMIN B-1) 50 MG tablet Take 50 mg by mouth daily.    .  valACYclovir (VALTREX) 500 MG tablet Take by mouth.    [name] Kitchen aspirin EC 81 MG tablet Take 81 mg by mouth daily. Swallow whole. (Patient not taking: Reported on 02/26/2021)    . Biotin 1 MG CAPS Take by mouth. (Patient not taking: Reported on 02/26/2021)    . Cholecalciferol (VITAMIN D3) 100000 UNIT/GM POWD Take by mouth. (Patient not taking: Reported on 02/26/2021)    . ciclopirox (PENLAC) 8 % solution Apply topically. (Patient not taking: Reported on 02/26/2021)    . diltiazem (TIAZAC) 120 MG 24 hr capsule Take 120 mg by mouth daily. (Patient not taking: Reported on 02/26/2021)    . famotidine (PEPCID) 40 MG tablet Take 1 tablet (40 mg total) by mouth 2 (two) times daily. 60 tablet 6  . hydrOXYzine (ATARAX/VISTARIL) 10 MG tablet Take 1 tablet (10 mg total) by mouth 3 (three) times daily as needed for anxiety. (Patient not taking: Reported on 02/26/2021) 30 tablet 0  . magnesium oxide (MAG-OX) 400 MG tablet Take 400 mg by mouth daily. (Patient not taking: Reported on 02/26/2021)    . Multiple Vitamins-Minerals (MULTIVITAMIN ADULT, MINERALS,) TABS Take by mouth. (Patient not taking: Reported on 02/26/2021)    . pantoprazole (PROTONIX) 40 MG tablet Take 1 tablet (40 mg total) by mouth 2 (  two) times daily. 60 tablet 6   No current facility-administered medications for this visit.    Allergies as of 02/26/2021 - Review Complete 02/26/2021  Allergen Reaction Noted  . Iodinated diagnostic agents Hives, Shortness Of Breath, Swelling, and Anaphylaxis 10/19/2018  . Citalopram Other (See Comments) 09/02/2020  . Corn-containing products  08/06/2020  . Eggs or egg-derived products Diarrhea 06/13/2018  . Gluten meal Diarrhea 02/19/2014  . Molds & smuts Hives 08/14/2020  . Pea Diarrhea 06/13/2018    ROS:  General: Negative for anorexia, weight loss, fever, chills, fatigue, weakness. ENT: Negative for hoarseness, difficulty swallowing , nasal congestion. CV: Negative for chest pain, angina, palpitations, dyspnea  on exertion, peripheral edema.  Respiratory: Negative for dyspnea at rest, dyspnea on exertion, cough, sputum, wheezing.  GI: See history of present illness. GU:  Negative for dysuria, hematuria, urinary incontinence, urinary frequency, nocturnal urination.  Endo: Negative for unusual weight change.    Physical Examination:   BP 137/86   Pulse 67   Temp 97.9 F (36.6 C) (Temporal)   Ht 5' 4"  (1.626 m)   Wt 278 lb 6.4 oz (126.3 kg)   BMI 47.79 kg/m   General: Well-nourished, well-developed in no acute distress.  Eyes: No icterus. Conjunctivae pink. Lungs: Clear to auscultation bilaterally. Non-labored. Heart: Regular rate and rhythm, no murmurs rubs or gallops.  Abdomen: Bowel sounds are normal, nontender, nondistended, no hepatosplenomegaly or masses, no abdominal bruits or hernia , no rebound or guarding.   Extremities: No lower extremity edema. No clubbing or deformities. Neuro: Alert and oriented x 3.  Grossly intact. Skin: Warm and dry, no jaundice.   Psych: Alert and cooperative, normal mood and affect.  Labs:    Imaging Studies: No results found.  Assessment and Plan:   Brenda Ross is a 45 y.o. y/o female who comes in today with a concern for acid breakthrough despite being on a PPI and an H2 blocker.  The patient will be set up for a 24-hour pH probe.  The patient is also concerned about whether or not she has a hiatal hernia since she feels food sticking sometimes as it goes down.  The patient will be set up for a barium swallow to see if there is any strictures or narrowing in the esophagus to explain her symptoms and to see if she is having any sign of a hiatal hernia.  The patient has been in the plan and agrees with it. She will also have her meds refilled.     Lucilla Lame, MD. Marval Regal    Note: This dictation was prepared with Dragon dictation along with smaller phrase technology. Any transcriptional errors that result from this process are unintentional.

## 2021-02-27 ENCOUNTER — Encounter (INDEPENDENT_AMBULATORY_CARE_PROVIDER_SITE_OTHER): Payer: Self-pay | Admitting: Vascular Surgery

## 2021-03-02 DIAGNOSIS — N760 Acute vaginitis: Secondary | ICD-10-CM | POA: Diagnosis not present

## 2021-03-02 DIAGNOSIS — R3915 Urgency of urination: Secondary | ICD-10-CM | POA: Diagnosis not present

## 2021-03-02 DIAGNOSIS — L292 Pruritus vulvae: Secondary | ICD-10-CM | POA: Diagnosis not present

## 2021-03-03 DIAGNOSIS — F411 Generalized anxiety disorder: Secondary | ICD-10-CM | POA: Diagnosis not present

## 2021-03-03 DIAGNOSIS — F900 Attention-deficit hyperactivity disorder, predominantly inattentive type: Secondary | ICD-10-CM | POA: Diagnosis not present

## 2021-03-03 DIAGNOSIS — F331 Major depressive disorder, recurrent, moderate: Secondary | ICD-10-CM | POA: Diagnosis not present

## 2021-03-04 ENCOUNTER — Ambulatory Visit
Admission: RE | Admit: 2021-03-04 | Discharge: 2021-03-04 | Disposition: A | Discharge status: Home / Self Care | Payer: Federal, State, Local not specified - PPO | Source: Ambulatory Visit | Attending: Gastroenterology | Admitting: Gastroenterology

## 2021-03-04 ENCOUNTER — Other Ambulatory Visit: Payer: Self-pay

## 2021-03-04 ENCOUNTER — Telehealth: Payer: Self-pay

## 2021-03-04 DIAGNOSIS — K2100 Gastro-esophageal reflux disease with esophagitis, without bleeding: Secondary | ICD-10-CM | POA: Diagnosis not present

## 2021-03-04 DIAGNOSIS — K219 Gastro-esophageal reflux disease without esophagitis: Secondary | ICD-10-CM | POA: Diagnosis not present

## 2021-03-04 NOTE — Telephone Encounter (Signed)
-----   Message from Lucilla Lame, MD sent at 03/04/2021  1:07 PM EDT ----- Let the patient know that her barium swallow showed that there was no sign of of narrowing or strictures.  There is no sign of dysmotility and there is no reflux seen during the time of the procedure.

## 2021-03-04 NOTE — Telephone Encounter (Signed)
Pt notified of barium swallow results through mychart.

## 2021-03-10 DIAGNOSIS — F411 Generalized anxiety disorder: Secondary | ICD-10-CM | POA: Diagnosis not present

## 2021-03-10 DIAGNOSIS — F900 Attention-deficit hyperactivity disorder, predominantly inattentive type: Secondary | ICD-10-CM | POA: Diagnosis not present

## 2021-03-10 DIAGNOSIS — F331 Major depressive disorder, recurrent, moderate: Secondary | ICD-10-CM | POA: Diagnosis not present

## 2021-03-16 DIAGNOSIS — G4733 Obstructive sleep apnea (adult) (pediatric): Secondary | ICD-10-CM | POA: Diagnosis not present

## 2021-03-17 DIAGNOSIS — F900 Attention-deficit hyperactivity disorder, predominantly inattentive type: Secondary | ICD-10-CM | POA: Diagnosis not present

## 2021-03-17 DIAGNOSIS — F331 Major depressive disorder, recurrent, moderate: Secondary | ICD-10-CM | POA: Diagnosis not present

## 2021-03-17 DIAGNOSIS — F411 Generalized anxiety disorder: Secondary | ICD-10-CM | POA: Diagnosis not present

## 2021-03-23 DIAGNOSIS — G4733 Obstructive sleep apnea (adult) (pediatric): Secondary | ICD-10-CM | POA: Diagnosis not present

## 2021-03-24 DIAGNOSIS — F331 Major depressive disorder, recurrent, moderate: Secondary | ICD-10-CM | POA: Diagnosis not present

## 2021-03-24 DIAGNOSIS — Z6841 Body Mass Index (BMI) 40.0 and over, adult: Secondary | ICD-10-CM | POA: Diagnosis not present

## 2021-03-24 DIAGNOSIS — F900 Attention-deficit hyperactivity disorder, predominantly inattentive type: Secondary | ICD-10-CM | POA: Diagnosis not present

## 2021-03-24 DIAGNOSIS — F411 Generalized anxiety disorder: Secondary | ICD-10-CM | POA: Diagnosis not present

## 2021-03-24 DIAGNOSIS — Z1231 Encounter for screening mammogram for malignant neoplasm of breast: Secondary | ICD-10-CM | POA: Diagnosis not present

## 2021-03-24 DIAGNOSIS — Z01419 Encounter for gynecological examination (general) (routine) without abnormal findings: Secondary | ICD-10-CM | POA: Diagnosis not present

## 2021-03-31 DIAGNOSIS — F331 Major depressive disorder, recurrent, moderate: Secondary | ICD-10-CM | POA: Diagnosis not present

## 2021-03-31 DIAGNOSIS — F411 Generalized anxiety disorder: Secondary | ICD-10-CM | POA: Diagnosis not present

## 2021-03-31 DIAGNOSIS — F900 Attention-deficit hyperactivity disorder, predominantly inattentive type: Secondary | ICD-10-CM | POA: Diagnosis not present

## 2021-04-10 DIAGNOSIS — F411 Generalized anxiety disorder: Secondary | ICD-10-CM | POA: Diagnosis not present

## 2021-04-10 DIAGNOSIS — F331 Major depressive disorder, recurrent, moderate: Secondary | ICD-10-CM | POA: Diagnosis not present

## 2021-04-14 DIAGNOSIS — F331 Major depressive disorder, recurrent, moderate: Secondary | ICD-10-CM | POA: Diagnosis not present

## 2021-04-14 DIAGNOSIS — F411 Generalized anxiety disorder: Secondary | ICD-10-CM | POA: Diagnosis not present

## 2021-04-15 DIAGNOSIS — G4733 Obstructive sleep apnea (adult) (pediatric): Secondary | ICD-10-CM | POA: Diagnosis not present

## 2021-04-21 DIAGNOSIS — F900 Attention-deficit hyperactivity disorder, predominantly inattentive type: Secondary | ICD-10-CM | POA: Diagnosis not present

## 2021-04-21 DIAGNOSIS — F331 Major depressive disorder, recurrent, moderate: Secondary | ICD-10-CM | POA: Diagnosis not present

## 2021-04-21 DIAGNOSIS — F411 Generalized anxiety disorder: Secondary | ICD-10-CM | POA: Diagnosis not present

## 2021-04-28 DIAGNOSIS — F900 Attention-deficit hyperactivity disorder, predominantly inattentive type: Secondary | ICD-10-CM | POA: Diagnosis not present

## 2021-04-28 DIAGNOSIS — F331 Major depressive disorder, recurrent, moderate: Secondary | ICD-10-CM | POA: Diagnosis not present

## 2021-04-28 DIAGNOSIS — F411 Generalized anxiety disorder: Secondary | ICD-10-CM | POA: Diagnosis not present

## 2021-05-14 DIAGNOSIS — K219 Gastro-esophageal reflux disease without esophagitis: Secondary | ICD-10-CM | POA: Diagnosis not present

## 2021-05-22 DIAGNOSIS — F900 Attention-deficit hyperactivity disorder, predominantly inattentive type: Secondary | ICD-10-CM | POA: Diagnosis not present

## 2021-05-22 DIAGNOSIS — F411 Generalized anxiety disorder: Secondary | ICD-10-CM | POA: Diagnosis not present

## 2021-05-22 DIAGNOSIS — F331 Major depressive disorder, recurrent, moderate: Secondary | ICD-10-CM | POA: Diagnosis not present

## 2021-05-27 DIAGNOSIS — F331 Major depressive disorder, recurrent, moderate: Secondary | ICD-10-CM | POA: Diagnosis not present

## 2021-05-27 DIAGNOSIS — F411 Generalized anxiety disorder: Secondary | ICD-10-CM | POA: Diagnosis not present

## 2021-05-27 DIAGNOSIS — F900 Attention-deficit hyperactivity disorder, predominantly inattentive type: Secondary | ICD-10-CM | POA: Diagnosis not present

## 2021-06-16 DIAGNOSIS — F331 Major depressive disorder, recurrent, moderate: Secondary | ICD-10-CM | POA: Diagnosis not present

## 2021-06-16 DIAGNOSIS — F411 Generalized anxiety disorder: Secondary | ICD-10-CM | POA: Diagnosis not present

## 2021-06-16 DIAGNOSIS — F900 Attention-deficit hyperactivity disorder, predominantly inattentive type: Secondary | ICD-10-CM | POA: Diagnosis not present

## 2021-06-19 DIAGNOSIS — J208 Acute bronchitis due to other specified organisms: Secondary | ICD-10-CM | POA: Diagnosis not present

## 2021-06-19 DIAGNOSIS — U071 COVID-19: Secondary | ICD-10-CM | POA: Diagnosis not present

## 2021-06-22 DIAGNOSIS — G4733 Obstructive sleep apnea (adult) (pediatric): Secondary | ICD-10-CM | POA: Diagnosis not present

## 2021-06-22 DIAGNOSIS — M3500 Sicca syndrome, unspecified: Secondary | ICD-10-CM | POA: Diagnosis not present

## 2021-06-22 DIAGNOSIS — U071 COVID-19: Secondary | ICD-10-CM | POA: Diagnosis not present

## 2021-06-22 DIAGNOSIS — J208 Acute bronchitis due to other specified organisms: Secondary | ICD-10-CM | POA: Diagnosis not present

## 2021-06-23 DIAGNOSIS — F411 Generalized anxiety disorder: Secondary | ICD-10-CM | POA: Diagnosis not present

## 2021-06-23 DIAGNOSIS — U071 COVID-19: Secondary | ICD-10-CM | POA: Diagnosis not present

## 2021-06-23 DIAGNOSIS — F331 Major depressive disorder, recurrent, moderate: Secondary | ICD-10-CM | POA: Diagnosis not present

## 2021-06-23 DIAGNOSIS — F900 Attention-deficit hyperactivity disorder, predominantly inattentive type: Secondary | ICD-10-CM | POA: Diagnosis not present

## 2021-06-23 DIAGNOSIS — M3500 Sicca syndrome, unspecified: Secondary | ICD-10-CM | POA: Diagnosis not present

## 2021-06-25 DIAGNOSIS — J208 Acute bronchitis due to other specified organisms: Secondary | ICD-10-CM | POA: Diagnosis not present

## 2021-06-25 DIAGNOSIS — J209 Acute bronchitis, unspecified: Secondary | ICD-10-CM | POA: Diagnosis not present

## 2021-06-25 DIAGNOSIS — U071 COVID-19: Secondary | ICD-10-CM | POA: Diagnosis not present

## 2021-06-25 DIAGNOSIS — M3500 Sicca syndrome, unspecified: Secondary | ICD-10-CM | POA: Diagnosis not present

## 2021-06-25 DIAGNOSIS — I10 Essential (primary) hypertension: Secondary | ICD-10-CM | POA: Diagnosis not present

## 2021-06-25 DIAGNOSIS — J189 Pneumonia, unspecified organism: Secondary | ICD-10-CM | POA: Diagnosis not present

## 2021-07-03 DIAGNOSIS — F411 Generalized anxiety disorder: Secondary | ICD-10-CM | POA: Diagnosis not present

## 2021-07-03 DIAGNOSIS — F331 Major depressive disorder, recurrent, moderate: Secondary | ICD-10-CM | POA: Diagnosis not present

## 2021-07-03 DIAGNOSIS — F900 Attention-deficit hyperactivity disorder, predominantly inattentive type: Secondary | ICD-10-CM | POA: Diagnosis not present

## 2021-07-07 DIAGNOSIS — F900 Attention-deficit hyperactivity disorder, predominantly inattentive type: Secondary | ICD-10-CM | POA: Diagnosis not present

## 2021-07-07 DIAGNOSIS — F411 Generalized anxiety disorder: Secondary | ICD-10-CM | POA: Diagnosis not present

## 2021-07-07 DIAGNOSIS — F331 Major depressive disorder, recurrent, moderate: Secondary | ICD-10-CM | POA: Diagnosis not present

## 2021-07-14 DIAGNOSIS — F331 Major depressive disorder, recurrent, moderate: Secondary | ICD-10-CM | POA: Diagnosis not present

## 2021-07-14 DIAGNOSIS — F900 Attention-deficit hyperactivity disorder, predominantly inattentive type: Secondary | ICD-10-CM | POA: Diagnosis not present

## 2021-07-14 DIAGNOSIS — F411 Generalized anxiety disorder: Secondary | ICD-10-CM | POA: Diagnosis not present

## 2021-07-17 DIAGNOSIS — G4733 Obstructive sleep apnea (adult) (pediatric): Secondary | ICD-10-CM | POA: Diagnosis not present

## 2021-07-21 DIAGNOSIS — F411 Generalized anxiety disorder: Secondary | ICD-10-CM | POA: Diagnosis not present

## 2021-07-21 DIAGNOSIS — F331 Major depressive disorder, recurrent, moderate: Secondary | ICD-10-CM | POA: Diagnosis not present

## 2021-07-21 DIAGNOSIS — F900 Attention-deficit hyperactivity disorder, predominantly inattentive type: Secondary | ICD-10-CM | POA: Diagnosis not present

## 2021-07-24 DIAGNOSIS — R0609 Other forms of dyspnea: Secondary | ICD-10-CM | POA: Diagnosis not present

## 2021-07-24 DIAGNOSIS — M3500 Sicca syndrome, unspecified: Secondary | ICD-10-CM | POA: Diagnosis not present

## 2021-07-24 DIAGNOSIS — I10 Essential (primary) hypertension: Secondary | ICD-10-CM | POA: Diagnosis not present

## 2021-07-24 DIAGNOSIS — G4733 Obstructive sleep apnea (adult) (pediatric): Secondary | ICD-10-CM | POA: Diagnosis not present

## 2021-07-28 DIAGNOSIS — F411 Generalized anxiety disorder: Secondary | ICD-10-CM | POA: Diagnosis not present

## 2021-07-28 DIAGNOSIS — F900 Attention-deficit hyperactivity disorder, predominantly inattentive type: Secondary | ICD-10-CM | POA: Diagnosis not present

## 2021-07-28 DIAGNOSIS — F331 Major depressive disorder, recurrent, moderate: Secondary | ICD-10-CM | POA: Diagnosis not present

## 2021-08-04 DIAGNOSIS — F411 Generalized anxiety disorder: Secondary | ICD-10-CM | POA: Diagnosis not present

## 2021-08-04 DIAGNOSIS — F900 Attention-deficit hyperactivity disorder, predominantly inattentive type: Secondary | ICD-10-CM | POA: Diagnosis not present

## 2021-08-04 DIAGNOSIS — F331 Major depressive disorder, recurrent, moderate: Secondary | ICD-10-CM | POA: Diagnosis not present

## 2021-08-11 DIAGNOSIS — F411 Generalized anxiety disorder: Secondary | ICD-10-CM | POA: Diagnosis not present

## 2021-08-11 DIAGNOSIS — F331 Major depressive disorder, recurrent, moderate: Secondary | ICD-10-CM | POA: Diagnosis not present

## 2021-08-11 DIAGNOSIS — F900 Attention-deficit hyperactivity disorder, predominantly inattentive type: Secondary | ICD-10-CM | POA: Diagnosis not present

## 2021-08-18 DIAGNOSIS — F900 Attention-deficit hyperactivity disorder, predominantly inattentive type: Secondary | ICD-10-CM | POA: Diagnosis not present

## 2021-08-18 DIAGNOSIS — F411 Generalized anxiety disorder: Secondary | ICD-10-CM | POA: Diagnosis not present

## 2021-08-18 DIAGNOSIS — F331 Major depressive disorder, recurrent, moderate: Secondary | ICD-10-CM | POA: Diagnosis not present

## 2021-08-24 ENCOUNTER — Ambulatory Visit (INDEPENDENT_AMBULATORY_CARE_PROVIDER_SITE_OTHER): Payer: Federal, State, Local not specified - PPO | Admitting: Vascular Surgery

## 2021-08-25 DIAGNOSIS — F331 Major depressive disorder, recurrent, moderate: Secondary | ICD-10-CM | POA: Diagnosis not present

## 2021-08-25 DIAGNOSIS — F900 Attention-deficit hyperactivity disorder, predominantly inattentive type: Secondary | ICD-10-CM | POA: Diagnosis not present

## 2021-08-25 DIAGNOSIS — F411 Generalized anxiety disorder: Secondary | ICD-10-CM | POA: Diagnosis not present

## 2021-08-31 ENCOUNTER — Other Ambulatory Visit: Payer: Self-pay

## 2021-08-31 ENCOUNTER — Ambulatory Visit (INDEPENDENT_AMBULATORY_CARE_PROVIDER_SITE_OTHER): Payer: Federal, State, Local not specified - PPO | Admitting: Vascular Surgery

## 2021-08-31 VITALS — BP 140/79 | HR 67 | Ht 64.0 in | Wt 289.0 lb

## 2021-08-31 DIAGNOSIS — I10 Essential (primary) hypertension: Secondary | ICD-10-CM | POA: Diagnosis not present

## 2021-08-31 DIAGNOSIS — K219 Gastro-esophageal reflux disease without esophagitis: Secondary | ICD-10-CM | POA: Diagnosis not present

## 2021-08-31 DIAGNOSIS — I872 Venous insufficiency (chronic) (peripheral): Secondary | ICD-10-CM

## 2021-08-31 DIAGNOSIS — I890 Lymphedema, not elsewhere classified: Secondary | ICD-10-CM | POA: Diagnosis not present

## 2021-09-01 DIAGNOSIS — F331 Major depressive disorder, recurrent, moderate: Secondary | ICD-10-CM | POA: Diagnosis not present

## 2021-09-01 DIAGNOSIS — F900 Attention-deficit hyperactivity disorder, predominantly inattentive type: Secondary | ICD-10-CM | POA: Diagnosis not present

## 2021-09-01 DIAGNOSIS — F411 Generalized anxiety disorder: Secondary | ICD-10-CM | POA: Diagnosis not present

## 2021-09-02 ENCOUNTER — Encounter (INDEPENDENT_AMBULATORY_CARE_PROVIDER_SITE_OTHER): Payer: Self-pay | Admitting: Vascular Surgery

## 2021-09-02 NOTE — Progress Notes (Signed)
MRN : [id number]  Brenda Ross is a 45 y.o. (30-Jun-1976) female who presents with chief complaint of check leg swelling.  History of Present Illness:   The patient returns to the office for followup evaluation regarding leg swelling.  The swelling has improved quite a bit and the pain associated with swelling has decreased substantially. There have not been any interval development of a ulcerations or wounds.  Since the previous visit the patient has been wearing graduated compression stockings and has noted little significant improvement in the lymphedema. The patient has been using compression routinely morning until night.  The patient also states elevation during the day and exercise is being done too.   The right ABI is 1.28 with a left ABI of 1.22.  The patient has good toe waveforms bilaterally with triphasic tibial artery waveforms bilaterally.   There is no evidence of DVT or superficial venous thrombosis bilaterally.  No evidence of deep venous insufficiency seen in the left lower extremity.  The right lower extremity has evidence of deep venous insufficiency in the popliteal vein with reflux in the great saphenous vein at the knee.    Current Meds  Medication Sig   ACIDOPHILUS LACTOBACILLUS PO Take by mouth.   Biotin 1 MG CAPS Take by mouth.   Biotin 1000 MCG CHEW Chew by mouth.   Cholecalciferol 50 MCG (2000 UT) TABS Take by mouth.   diltiazem (CARDIZEM CD) 120 MG 24 hr capsule Take 120 mg by mouth daily.   EPINEPHrine 0.3 mg/0.3 mL IJ SOAJ injection Inject 0.3 mLs (0.3 mg total) into the muscle as needed for anaphylaxis.   ergocalciferol (VITAMIN D2) 1.25 MG (50000 UT) capsule Take 1 capsule (50,000 Units total) by mouth once a week.   famotidine (PEPCID) 40 MG tablet Take 1 tablet (40 mg total) by mouth 2 (two) times daily.   losartan (COZAAR) 100 MG tablet Take 1 tablet by mouth daily.   montelukast (SINGULAIR) 10 MG tablet Take 1 tablet (10 mg total) by mouth  daily.   pantoprazole (PROTONIX) 40 MG tablet Take 1 tablet (40 mg total) by mouth 2 (two) times daily.   thiamine (VITAMIN B-1) 50 MG tablet Take 50 mg by mouth daily.   valACYclovir (VALTREX) 500 MG tablet Take by mouth.   [DISCONTINUED] Cholecalciferol 25 MCG (1000 UT) capsule Take by mouth.   [DISCONTINUED] montelukast (SINGULAIR) 10 MG tablet Take 1 tablet by mouth daily.    Past Medical History:  Diagnosis Date   Allergic rhinitis    Anemia    Anxiety    GERD (gastroesophageal reflux disease)    Herpes genitalia    Prediabetes    Sjogren's disease ([place])    Sleep apnea     Past Surgical History:  Procedure Laterality Date   APPENDECTOMY      Social History Social History   Tobacco Use   Smoking status: Former    Packs/day: 1.00    Years: 3.00    Pack years: 3.00    Types: Cigarettes    Quit date: 01/14/1996    Years since quitting: 25.6   Smokeless tobacco: Never  Vaping Use   Vaping Use: Never used  Substance Use Topics   Alcohol use: Yes    Alcohol/week: 7.0 - 14.0 standard drinks    Types: 7 - 14 Glasses of wine per week   Drug use: Never    Family History Family History  Problem Relation Age of Onset   Diabetes Mother  Hypertension Mother    Heart disease Mother    Allergic rhinitis Mother    Hypertension Father    Heart disease Father    Prostate cancer Father    Diabetes Father    Diabetes Brother    ADD / ADHD Brother    Asthma Brother    Allergic rhinitis Brother    ADD / ADHD Brother    Allergic rhinitis Brother    Allergic rhinitis Brother    Asthma Maternal Grandmother    Breast cancer Neg Hx    Colon cancer Neg Hx    Ovarian cancer Neg Hx     Allergies  Allergen Reactions   Iodinated Diagnostic Agents Hives, Shortness Of Breath, Swelling and Anaphylaxis   Iodine Anaphylaxis, Hives, Shortness Of Breath and Swelling   Other Hives    Pea causes diarrhea  Apples cause hives   Citalopram Other (See Comments)    Nausea, myalgias    Corn-Containing Products     Other reaction(s): Other (See Comments) Is sensitive to this   Eggs Or Egg-Derived Products Diarrhea   Gluten Meal Diarrhea    Celiac's disease   Molds & Smuts Hives   Pea Diarrhea     REVIEW OF SYSTEMS (Negative unless checked)  Constitutional: [] Weight loss  [] Fever  [] Chills Cardiac: [] Chest pain   [] Chest pressure   [] Palpitations   [] Shortness of breath when laying flat   [] Shortness of breath with exertion. Vascular:  [] Pain in legs with walking   [] Pain in legs at rest  [] History of DVT   [] Phlebitis   [x] Swelling in legs   [] Varicose veins   [] Non-healing ulcers Pulmonary:   [] Uses home oxygen   [] Productive cough   [] Hemoptysis   [] Wheeze  [] COPD   [] Asthma Neurologic:  [] Dizziness   [] Seizures   [] History of stroke   [] History of TIA  [] Aphasia   [] Vissual changes   [] Weakness or numbness in arm   [] Weakness or numbness in leg Musculoskeletal:   [] Joint swelling   [] Joint pain   [] Low back pain Hematologic:  [] Easy bruising  [] Easy bleeding   [] Hypercoagulable state   [] Anemic Gastrointestinal:  [] Diarrhea   [] Vomiting  [x] Gastroesophageal reflux/heartburn   [] Difficulty swallowing. Genitourinary:  [] Chronic kidney disease   [] Difficult urination  [] Frequent urination   [] Blood in urine Skin:  [] Rashes   [] Ulcers  Psychological:  [] History of anxiety   []  History of major depression.  Physical Examination  Vitals:   08/31/21 1641  BP: 140/79  Pulse: 67  Weight: 289 lb (131.1 kg)  Height: 5' 4"  (1.626 m)   Body mass index is 49.61 kg/m. Gen: WD/WN, NAD Head: [place]/AT, No temporalis wasting.  Ear/Nose/Throat: Hearing grossly intact, nares w/o erythema or drainage, pinna without lesions Eyes: PER, EOMI, sclera nonicteric.  Neck: Supple, no gross masses.  No JVD.  Pulmonary:  Good air movement, no audible wheezing, no use of accessory muscles.  Cardiac: RRR, precordium not hyperdynamic. Vascular:  scattered varicosities present  bilaterally.  Mild venous stasis changes to the legs bilaterally.  2+ soft pitting edema  Vessel Right Left  Radial Palpable Palpable  Gastrointestinal: soft, non-distended. No guarding/no peritoneal signs.  Musculoskeletal: M/S 5/5 throughout.  No deformity.  Neurologic: CN 2-12 intact. Pain and light touch intact in extremities.  Symmetrical.  Speech is fluent. Motor exam as listed above. Psychiatric: Judgment intact, Mood & affect appropriate for pt's clinical situation. Dermatologic: Venous rashes no ulcers noted.  No changes consistent with cellulitis. Lymph : No  lichenification or skin changes of chronic lymphedema.  CBC Lab Results  Component Value Date   WBC 7.8 10/03/2020   HGB 10.8 (L) 10/03/2020   HCT 35.3 10/03/2020   MCV 87 10/03/2020   PLT 292 10/03/2020    BMET    Component Value Date/Time   NA 141 08/06/2019 0918   K 4.2 08/06/2019 0918   CL 104 08/06/2019 0918   CO2 21 08/06/2019 0918   GLUCOSE 105 (H) 08/06/2019 0918   GLUCOSE 143 (H) 10/18/2018 2303   BUN 11 08/06/2019 0918   CREATININE 0.80 08/06/2019 0918   CALCIUM 9.2 08/06/2019 0918   GFRNONAA 90 08/06/2019 0918   GFRAA 104 08/06/2019 0918   CrCl cannot be calculated (Patient's most recent lab result is older than the maximum 21 days allowed.).  COAG No results found for: INR, PROTIME  Radiology No results found.   Assessment/Plan 1. Lymphedema No surgery or intervention at this point in time.  I have reviewed my discussion with the patient regarding venous insufficiency and why it causes symptoms. I have discussed with the patient the chronic skin changes that accompany venous insufficiency and the long term sequela such as ulceration. Patient will contnue wearing graduated compression stockings on a daily basis, as this has provided excellent control of his edema. The patient will put the stockings on first thing in the morning and removing them in the evening. The patient is reminded not to  sleep in the stockings.  In addition, behavioral modification including elevation during the day will be initiated. Exercise is strongly encouraged.  Given the patient's good control and lack of any problems regarding the venous insufficiency and lymphedema a lymph pump in not need at this time.  The patient will follow up with me PRN should anything change.  The patient voices agreement with this plan.   2. Chronic venous insufficiency No surgery or intervention at this point in time.  I have reviewed my discussion with the patient regarding venous insufficiency and why it causes symptoms. I have discussed with the patient the chronic skin changes that accompany venous insufficiency and the long term sequela such as ulceration. Patient will contnue wearing graduated compression stockings on a daily basis, as this has provided excellent control of his edema. The patient will put the stockings on first thing in the morning and removing them in the evening. The patient is reminded not to sleep in the stockings.  In addition, behavioral modification including elevation during the day will be initiated. Exercise is strongly encouraged.  Given the patient's good control and lack of any problems regarding the venous insufficiency and lymphedema a lymph pump in not need at this time.  The patient will follow up with me PRN should anything change.  The patient voices agreement with this plan.   3. Essential hypertension Continue antihypertensive medications as already ordered, these medications have been reviewed and there are no changes at this time.   4. Gastroesophageal reflux disease without esophagitis Continue PPI as already ordered, this medication has been reviewed and there are no changes at this time.  Avoidence of caffeine and alcohol  Moderate elevation of the head of the bed      Hortencia Pilar, MD  09/02/2021 3:45 PM

## 2021-09-15 DIAGNOSIS — F411 Generalized anxiety disorder: Secondary | ICD-10-CM | POA: Diagnosis not present

## 2021-09-15 DIAGNOSIS — F331 Major depressive disorder, recurrent, moderate: Secondary | ICD-10-CM | POA: Diagnosis not present

## 2021-09-15 DIAGNOSIS — F900 Attention-deficit hyperactivity disorder, predominantly inattentive type: Secondary | ICD-10-CM | POA: Diagnosis not present

## 2021-09-23 DIAGNOSIS — F411 Generalized anxiety disorder: Secondary | ICD-10-CM | POA: Diagnosis not present

## 2021-09-23 DIAGNOSIS — F902 Attention-deficit hyperactivity disorder, combined type: Secondary | ICD-10-CM | POA: Diagnosis not present

## 2021-09-23 DIAGNOSIS — F5105 Insomnia due to other mental disorder: Secondary | ICD-10-CM | POA: Diagnosis not present

## 2021-09-25 DIAGNOSIS — F902 Attention-deficit hyperactivity disorder, combined type: Secondary | ICD-10-CM | POA: Diagnosis not present

## 2021-09-29 DIAGNOSIS — F331 Major depressive disorder, recurrent, moderate: Secondary | ICD-10-CM | POA: Diagnosis not present

## 2021-09-29 DIAGNOSIS — F900 Attention-deficit hyperactivity disorder, predominantly inattentive type: Secondary | ICD-10-CM | POA: Diagnosis not present

## 2021-09-29 DIAGNOSIS — F411 Generalized anxiety disorder: Secondary | ICD-10-CM | POA: Diagnosis not present

## 2021-10-01 DIAGNOSIS — M2480 Other specific joint derangements of unspecified joint, not elsewhere classified: Secondary | ICD-10-CM | POA: Diagnosis not present

## 2021-10-01 DIAGNOSIS — K9049 Malabsorption due to intolerance, not elsewhere classified: Secondary | ICD-10-CM | POA: Diagnosis not present

## 2021-10-01 DIAGNOSIS — Z87892 Personal history of anaphylaxis: Secondary | ICD-10-CM | POA: Diagnosis not present

## 2021-10-01 DIAGNOSIS — D8940 Mast cell activation, unspecified: Secondary | ICD-10-CM | POA: Diagnosis not present

## 2021-10-02 DIAGNOSIS — F411 Generalized anxiety disorder: Secondary | ICD-10-CM | POA: Diagnosis not present

## 2021-10-02 DIAGNOSIS — F331 Major depressive disorder, recurrent, moderate: Secondary | ICD-10-CM | POA: Diagnosis not present

## 2021-10-02 DIAGNOSIS — F900 Attention-deficit hyperactivity disorder, predominantly inattentive type: Secondary | ICD-10-CM | POA: Diagnosis not present

## 2021-10-08 DIAGNOSIS — F902 Attention-deficit hyperactivity disorder, combined type: Secondary | ICD-10-CM | POA: Diagnosis not present

## 2021-10-08 DIAGNOSIS — F411 Generalized anxiety disorder: Secondary | ICD-10-CM | POA: Diagnosis not present

## 2021-10-08 DIAGNOSIS — F5105 Insomnia due to other mental disorder: Secondary | ICD-10-CM | POA: Diagnosis not present

## 2021-10-13 DIAGNOSIS — F900 Attention-deficit hyperactivity disorder, predominantly inattentive type: Secondary | ICD-10-CM | POA: Diagnosis not present

## 2021-10-13 DIAGNOSIS — F331 Major depressive disorder, recurrent, moderate: Secondary | ICD-10-CM | POA: Diagnosis not present

## 2021-10-13 DIAGNOSIS — F411 Generalized anxiety disorder: Secondary | ICD-10-CM | POA: Diagnosis not present

## 2021-10-14 DIAGNOSIS — G4733 Obstructive sleep apnea (adult) (pediatric): Secondary | ICD-10-CM | POA: Diagnosis not present

## 2021-10-27 DIAGNOSIS — F411 Generalized anxiety disorder: Secondary | ICD-10-CM | POA: Diagnosis not present

## 2021-10-27 DIAGNOSIS — F900 Attention-deficit hyperactivity disorder, predominantly inattentive type: Secondary | ICD-10-CM | POA: Diagnosis not present

## 2021-10-27 DIAGNOSIS — F331 Major depressive disorder, recurrent, moderate: Secondary | ICD-10-CM | POA: Diagnosis not present

## 2021-11-03 DIAGNOSIS — F902 Attention-deficit hyperactivity disorder, combined type: Secondary | ICD-10-CM | POA: Diagnosis not present

## 2021-11-03 DIAGNOSIS — F5105 Insomnia due to other mental disorder: Secondary | ICD-10-CM | POA: Diagnosis not present

## 2021-11-03 DIAGNOSIS — F411 Generalized anxiety disorder: Secondary | ICD-10-CM | POA: Diagnosis not present

## 2021-11-10 DIAGNOSIS — F331 Major depressive disorder, recurrent, moderate: Secondary | ICD-10-CM | POA: Diagnosis not present

## 2021-11-10 DIAGNOSIS — F411 Generalized anxiety disorder: Secondary | ICD-10-CM | POA: Diagnosis not present

## 2021-11-10 DIAGNOSIS — F900 Attention-deficit hyperactivity disorder, predominantly inattentive type: Secondary | ICD-10-CM | POA: Diagnosis not present

## 2021-11-13 DIAGNOSIS — G4733 Obstructive sleep apnea (adult) (pediatric): Secondary | ICD-10-CM | POA: Diagnosis not present

## 2021-12-01 DIAGNOSIS — F900 Attention-deficit hyperactivity disorder, predominantly inattentive type: Secondary | ICD-10-CM | POA: Diagnosis not present

## 2021-12-01 DIAGNOSIS — F411 Generalized anxiety disorder: Secondary | ICD-10-CM | POA: Diagnosis not present

## 2021-12-01 DIAGNOSIS — F331 Major depressive disorder, recurrent, moderate: Secondary | ICD-10-CM | POA: Diagnosis not present

## 2021-12-01 DIAGNOSIS — F902 Attention-deficit hyperactivity disorder, combined type: Secondary | ICD-10-CM | POA: Diagnosis not present

## 2021-12-01 DIAGNOSIS — F5105 Insomnia due to other mental disorder: Secondary | ICD-10-CM | POA: Diagnosis not present

## 2021-12-02 DIAGNOSIS — E559 Vitamin D deficiency, unspecified: Secondary | ICD-10-CM | POA: Diagnosis not present

## 2021-12-02 DIAGNOSIS — R202 Paresthesia of skin: Secondary | ICD-10-CM | POA: Diagnosis not present

## 2021-12-02 DIAGNOSIS — M3500 Sicca syndrome, unspecified: Secondary | ICD-10-CM | POA: Diagnosis not present

## 2021-12-21 ENCOUNTER — Other Ambulatory Visit: Payer: Self-pay | Admitting: Gastroenterology

## 2022-01-21 IMAGING — US US SOFT TISSUE HEAD/NECK
1 series · 14 of 16 positions shown · non-contrast
Comparison: None.

CLINICAL DATA: 43-year-old female with a history of lymphadenopathy

EXAM:
ULTRASOUND OF HEAD/NECK SOFT TISSUES
TECHNIQUE: Ultrasound examination of the head and neck soft tissues was
performed in the area of clinical concern.

[Series 1: us soft tissue head/neck · 0.05mm/px · 16 acquisitions, 14 frames shown]
[im 1/16]
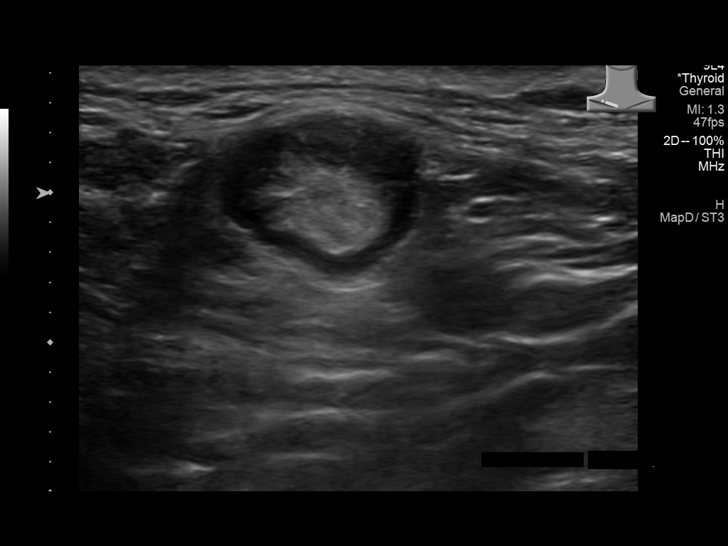
[im 2/16]
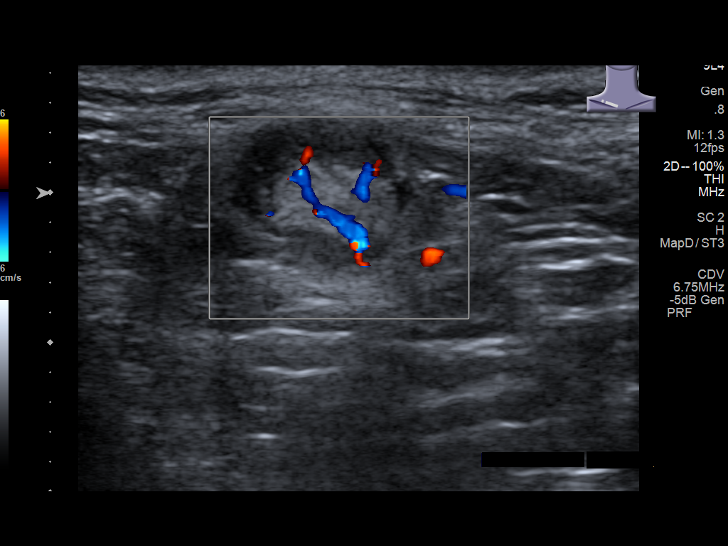
[im 3/16]
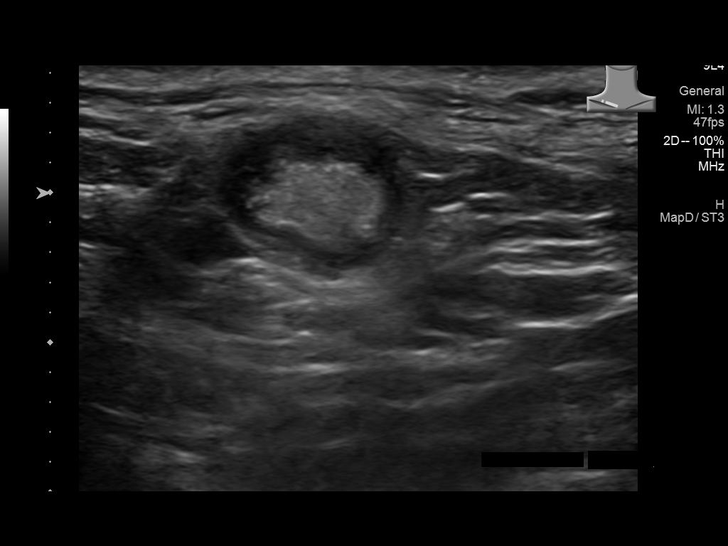
[im 5/16]
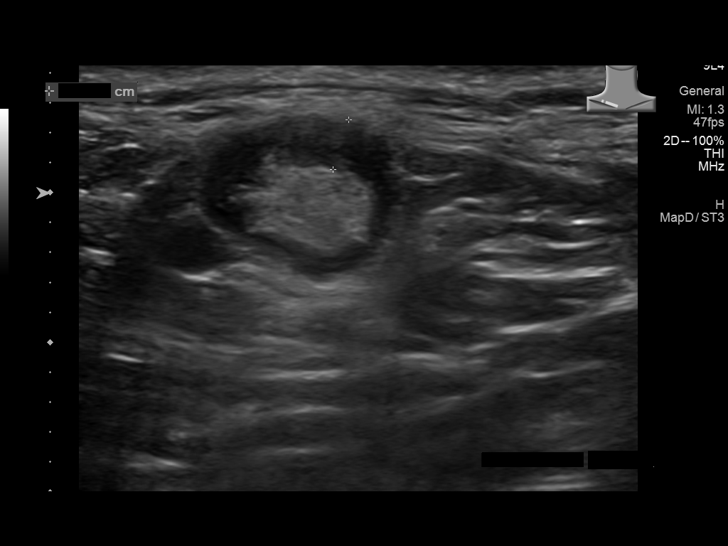
[im 6/16]
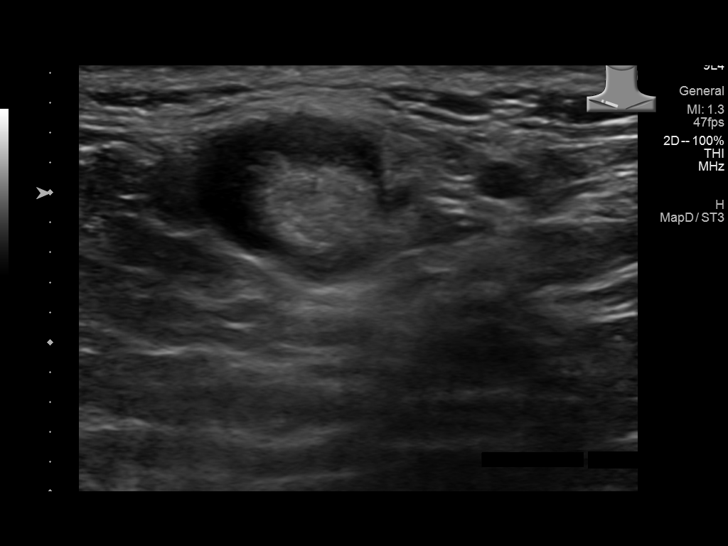
[im 7/16]
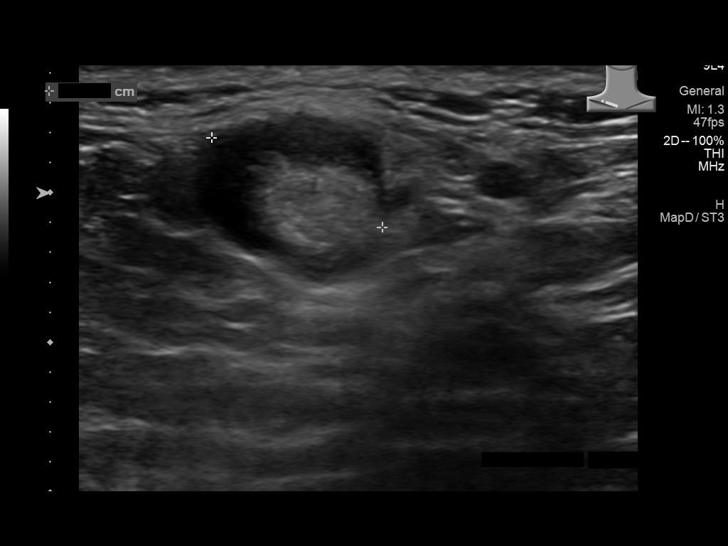
[im 8/16]
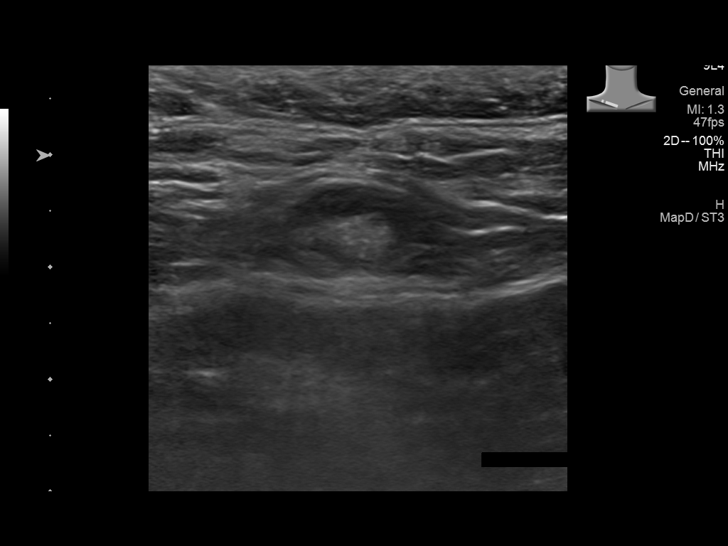
[im 9/16]
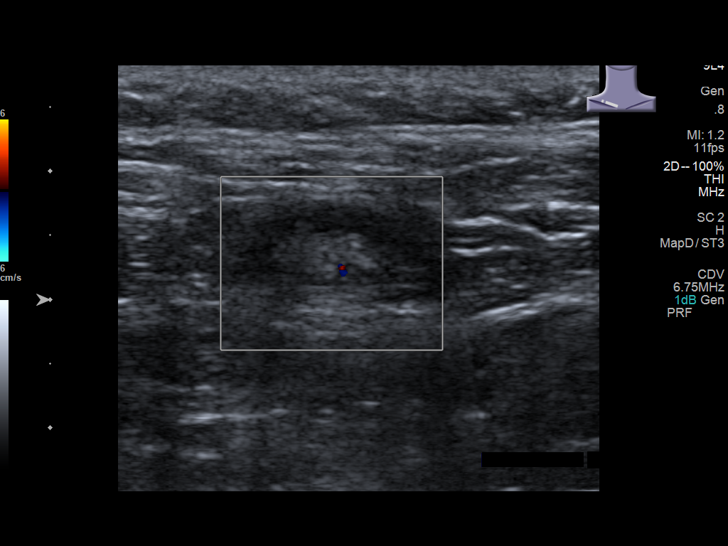
[im 10/16]
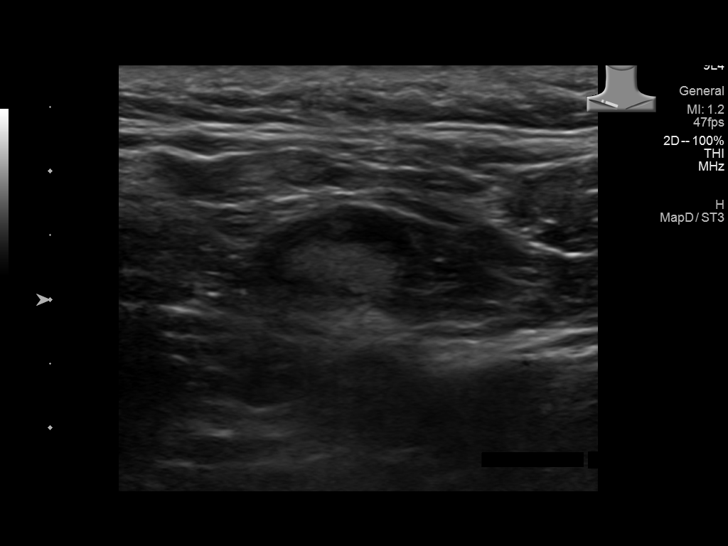
[im 11/16]
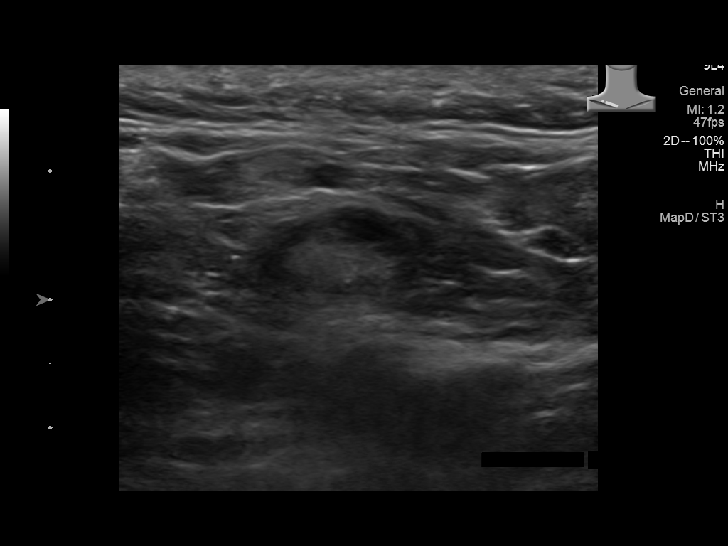
[im 13/16]
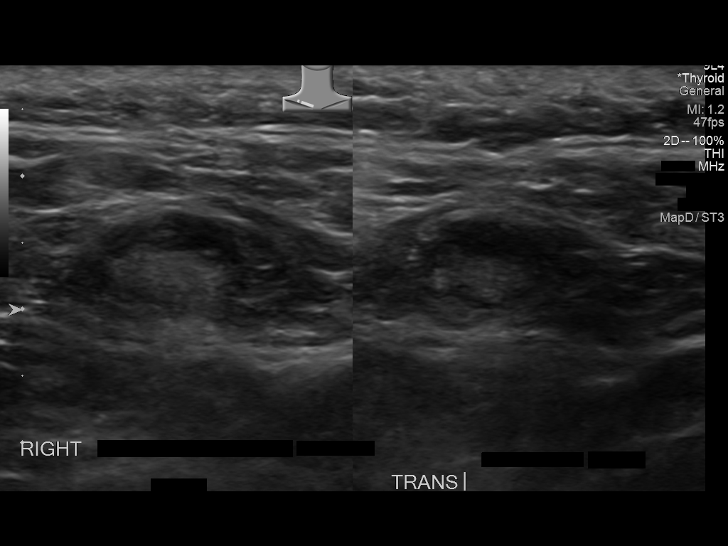
[im 14/16]
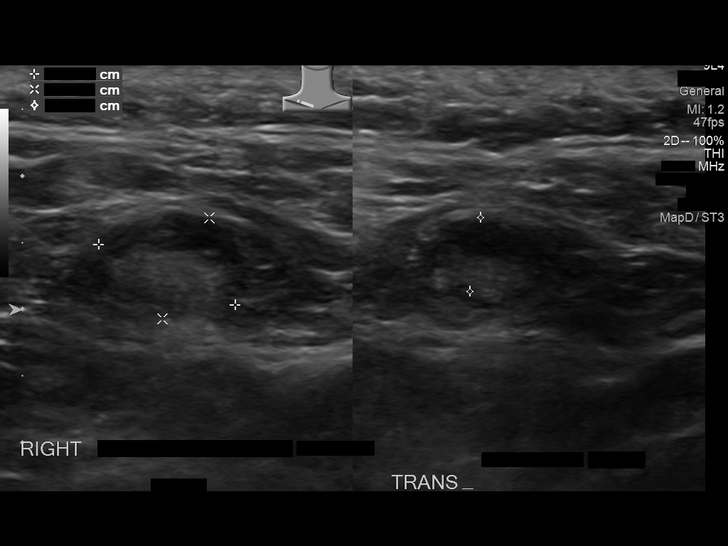
[im 15/16]
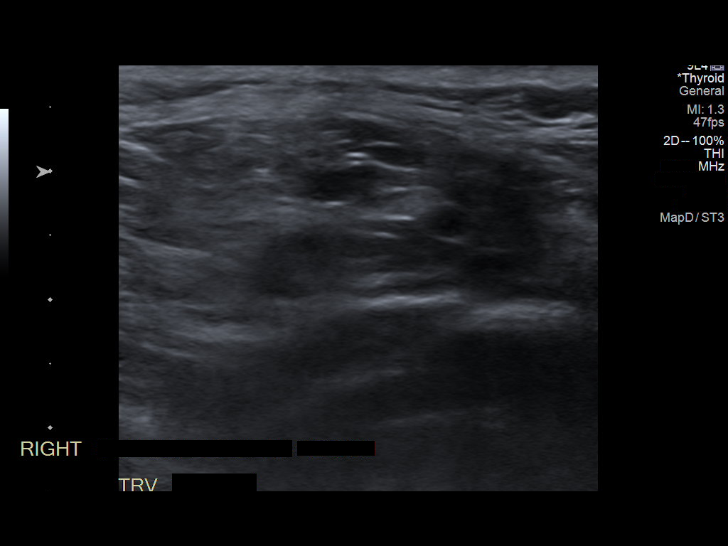
[im 16/16]
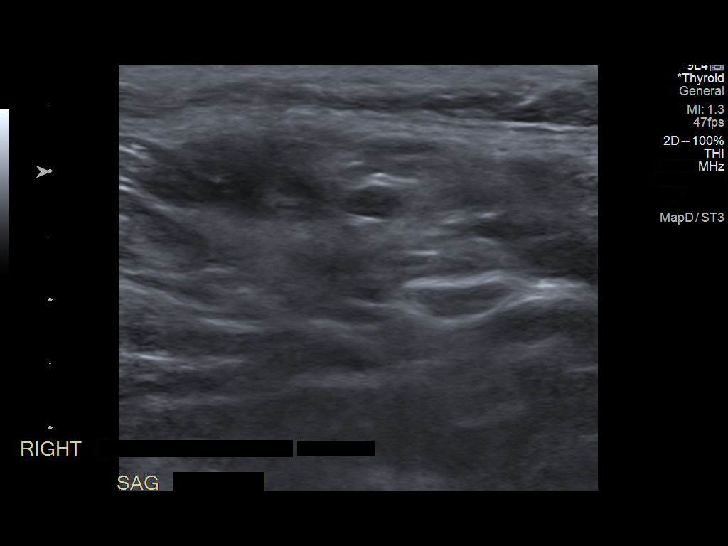

[14 of 16 positions shown; findings below may reference images not displayed]

FINDINGS: Grayscale and color duplex performed in the region of clinical
concern.

Lymph node within the right supraclavicular region with typical
architecture maintained measuring short axis dimension of
approximately 1 cm.
IMPRESSION: Sonographic survey demonstrates borderline enlarged lymph nodes in
the right supraclavicular region, potentially reactive though
nonspecific.

## 2022-03-18 ENCOUNTER — Telehealth: Payer: Self-pay

## 2022-03-18 NOTE — Telephone Encounter (Signed)
Just a FYI: ? ?Patient called requesting our office to give her recommendations on what allergy meds to take because she is currently itching. I let her know that she hasn't been seen since 09/12/2020 and that she is due for a visit. She said she will schedule a visit but wants an answer today. I told her I could read off of her last AVS and she states no she will just call the pharmacy.  ? ? ?

## 2022-05-06 ENCOUNTER — Other Ambulatory Visit: Payer: Self-pay | Admitting: Physician Assistant

## 2022-05-06 DIAGNOSIS — R000 Tachycardia, unspecified: Secondary | ICD-10-CM

## 2022-05-06 DIAGNOSIS — I10 Essential (primary) hypertension: Secondary | ICD-10-CM

## 2022-05-06 DIAGNOSIS — R079 Chest pain, unspecified: Secondary | ICD-10-CM

## 2022-05-11 ENCOUNTER — Ambulatory Visit
Admission: RE | Admit: 2022-05-11 | Discharge: 2022-05-11 | Disposition: A | Discharge status: Home / Self Care | Payer: Federal, State, Local not specified - PPO | Source: Ambulatory Visit | Attending: Physician Assistant | Admitting: Physician Assistant

## 2022-05-11 DIAGNOSIS — R079 Chest pain, unspecified: Secondary | ICD-10-CM | POA: Insufficient documentation

## 2022-05-11 DIAGNOSIS — R000 Tachycardia, unspecified: Secondary | ICD-10-CM | POA: Insufficient documentation

## 2022-05-11 DIAGNOSIS — I10 Essential (primary) hypertension: Secondary | ICD-10-CM | POA: Insufficient documentation

## 2022-07-15 ENCOUNTER — Other Ambulatory Visit (HOSPITAL_COMMUNITY): Payer: Self-pay | Admitting: Neurology

## 2022-07-15 ENCOUNTER — Other Ambulatory Visit: Payer: Self-pay | Admitting: Neurology

## 2022-07-15 DIAGNOSIS — M6281 Muscle weakness (generalized): Secondary | ICD-10-CM

## 2022-07-15 DIAGNOSIS — R413 Other amnesia: Secondary | ICD-10-CM

## 2022-09-03 ENCOUNTER — Other Ambulatory Visit: Payer: Self-pay | Admitting: Internal Medicine

## 2022-09-03 DIAGNOSIS — R1011 Right upper quadrant pain: Secondary | ICD-10-CM

## 2022-09-10 ENCOUNTER — Ambulatory Visit
Admission: RE | Admit: 2022-09-10 | Discharge: 2022-09-10 | Disposition: A | Discharge status: Home / Self Care | Payer: Federal, State, Local not specified - PPO | Source: Ambulatory Visit | Attending: Internal Medicine | Admitting: Internal Medicine

## 2022-09-10 DIAGNOSIS — R1011 Right upper quadrant pain: Secondary | ICD-10-CM | POA: Diagnosis not present

## 2022-09-14 ENCOUNTER — Encounter: Payer: Self-pay | Admitting: Gastroenterology

## 2022-09-14 ENCOUNTER — Ambulatory Visit (INDEPENDENT_AMBULATORY_CARE_PROVIDER_SITE_OTHER): Payer: Federal, State, Local not specified - PPO | Admitting: Gastroenterology

## 2022-09-14 VITALS — BP 150/91 | HR 73 | Temp 98.5°F | Wt 278.0 lb

## 2022-09-14 DIAGNOSIS — R1011 Right upper quadrant pain: Secondary | ICD-10-CM | POA: Diagnosis not present

## 2022-09-14 NOTE — Progress Notes (Signed)
Primary Care Physician: Gladstone Lighter, MD  Primary Gastroenterologist:  Dr. Lucilla Lame  Chief Complaint  Patient presents with   Abdominal Pain         HPI: Brenda Ross is a 46 y.o. female here to see me in the past for dysphagia and acid breakthrough.  The patient was recommended to have a pH probe placed and a barium swallow.  The barium swallow did not show any strictures or dysmotility and the patient does not appear to have undergone the pH probe.  The patient now comes to see me today because of right upper quadrant pain.  The patient recently had a right upper quadrant ultrasound that showed:  IMPRESSION: 1. Increased hepatic parenchymal echogenicity suggestive of steatosis. 2. 7 mm gallbladder polyp. Recommend follow-up ultrasound in 1 year. 3. No cholelithiasis or sonographic evidence for acute cholecystitis.  The patient's LFTs were also found to be normal. The patient reports her abdominal pain to be worse with eating and usually with fatty and greasy foods but also happens when she bends over.  There is no report of any unexplained weight loss and she states she has been trying to lose weight.  There is no report of any black stools or bloody stools.  Past Medical History:  Diagnosis Date   Allergic rhinitis    Anemia    Anxiety    GERD (gastroesophageal reflux disease)    Herpes genitalia    Prediabetes    Sjogren's disease ([place])    Sleep apnea     Current Outpatient Medications  Medication Sig Dispense Refill   ACIDOPHILUS LACTOBACILLUS PO Take by mouth.     Biotin 1000 MCG CHEW Chew by mouth.     Cholecalciferol (VITAMIN D3) 100000 UNIT/GM POWD Take by mouth.     Cholecalciferol 50 MCG (2000 UT) TABS Take by mouth.     ciclopirox (PENLAC) 8 % solution Apply topically.     diltiazem (CARDIZEM CD) 120 MG 24 hr capsule Take 120 mg by mouth daily.     diltiazem (TIAZAC) 120 MG 24 hr capsule Take 120 mg by mouth daily.     EPINEPHrine 0.3  mg/0.3 mL IJ SOAJ injection Inject 0.3 mLs (0.3 mg total) into the muscle as needed for anaphylaxis. 2 each 1   ergocalciferol (VITAMIN D2) 1.25 MG (50000 UT) capsule Take 1 capsule (50,000 Units total) by mouth once a week. 4 capsule 1   famotidine (PEPCID) 40 MG tablet TAKE ONE TABLET BY MOUTH TWICE A DAY 60 tablet 1   hydrOXYzine (ATARAX/VISTARIL) 10 MG tablet Take 1 tablet (10 mg total) by mouth 3 (three) times daily as needed for anxiety. 30 tablet 0   losartan (COZAAR) 100 MG tablet Take 1 tablet by mouth daily.     magnesium oxide (MAG-OX) 400 MG tablet Take 400 mg by mouth daily.     montelukast (SINGULAIR) 10 MG tablet Take 1 tablet (10 mg total) by mouth daily. 90 tablet 1   Multiple Vitamins-Minerals (MULTIVITAMIN ADULT, MINERALS,) TABS Take by mouth.     pantoprazole (PROTONIX) 40 MG tablet Take 1 tablet (40 mg total) by mouth 2 (two) times daily. 60 tablet 6   thiamine (VITAMIN B-1) 50 MG tablet Take 50 mg by mouth daily.     valACYclovir (VALTREX) 500 MG tablet Take by mouth.     No current facility-administered medications for this visit.    Allergies as of 09/14/2022 - Review Complete 09/14/2022  Allergen Reaction Noted  Iodinated contrast media Hives, Shortness Of Breath, Swelling, and Anaphylaxis 10/19/2018   Iodine Anaphylaxis, Hives, Shortness Of Breath, and Swelling 10/19/2018   Other Hives 05/14/2021   Citalopram Other (See Comments) 09/02/2020   Corn-containing products  08/06/2020   Eggs or egg-derived products Diarrhea 06/13/2018   Gluten meal Diarrhea 02/19/2014   Molds & smuts Hives 08/14/2020   Pea Diarrhea 06/13/2018    ROS:  General: Negative for anorexia, weight loss, fever, chills, fatigue, weakness. ENT: Negative for hoarseness, difficulty swallowing , nasal congestion. CV: Negative for chest pain, angina, palpitations, dyspnea on exertion, peripheral edema.  Respiratory: Negative for dyspnea at rest, dyspnea on exertion, cough, sputum, wheezing.   GI: See history of present illness. GU:  Negative for dysuria, hematuria, urinary incontinence, urinary frequency, nocturnal urination.  Endo: Negative for unusual weight change.    Physical Examination:   BP (!) 150/91   Pulse 73   Temp 98.5 F (36.9 C) (Oral)   Wt 278 lb (126.1 kg)   BMI 47.72 kg/m   General: Well-nourished, well-developed in no acute distress.  Eyes: No icterus. Conjunctivae pink. Lungs: Clear to auscultation bilaterally. Non-labored. Heart: Regular rate and rhythm, no murmurs rubs or gallops.  Abdomen: Bowel sounds are normal, Positive tenderness one finger palpation while flexing the abdominal wall muscle, nondistended, no hepatosplenomegaly or masses, no abdominal bruits or hernia , no rebound or guarding.   Extremities: No lower extremity edema. No clubbing or deformities. Neuro: Alert and oriented x 3.  Grossly intact. Skin: Warm and dry, no jaundice.   Psych: Alert and cooperative, normal mood and affect.  Labs:    Imaging Studies: US Abdomen Limited RUQ (LIVER/GB)  Result Date: 09/10/2022 CLINICAL DATA:  Right upper quadrant abdominal pain EXAM: ULTRASOUND ABDOMEN LIMITED RIGHT UPPER QUADRANT COMPARISON:  None Available. FINDINGS: Gallbladder: Gallbladder is contracted. No wall thickening. Note is made of a 7 mm gallbladder polyp. No cholelithiasis. Negative sonographic Murphy's sign. Common bile duct: Diameter: 2 mm Liver: Increased echogenicity. No focal lesion. Portal vein is patent on color Doppler imaging with normal direction of blood flow towards the liver. Other: None. IMPRESSION: 1. Increased hepatic parenchymal echogenicity suggestive of steatosis. 2. 7 mm gallbladder polyp. Recommend follow-up ultrasound in 1 year. 3. No cholelithiasis or sonographic evidence for acute cholecystitis. Electronically Signed   By: Lovey Newcomer M.D.   On: 09/10/2022 08:49    Assessment and Plan:   Brenda Ross is a 46 y.o. y/o female who comes in today with  right upper quadrant pain that appears to be muscular skeletal on physical exam but the patient also has a history of back pain which would go along with this being abdominal wall muscle pain. Due to the patient's age and weight and symptoms the patient will also be set up for a gallbladder emptying study.   The patient has been splitting the plan and agrees with it.     Lucilla Lame, MD. Mar[facility] Regal    Note: This dictation was prepared with Dragon dictation along with smaller phrase technology. Any transcriptional errors that result from this process are unintentional.

## 2022-09-29 ENCOUNTER — Encounter
Admission: RE | Admit: 2022-09-29 | Discharge: 2022-09-29 | Disposition: A | Discharge status: Home / Self Care | Payer: Federal, State, Local not specified - PPO | Source: Ambulatory Visit | Attending: Gastroenterology | Admitting: Gastroenterology

## 2022-09-29 DIAGNOSIS — R1011 Right upper quadrant pain: Secondary | ICD-10-CM | POA: Diagnosis present

## 2022-09-29 MED ORDER — TECHNETIUM TC 99M MEBROFENIN IV KIT
5.1200 | PACK | Freq: Once | INTRAVENOUS | Status: AC | PRN
  Administered 2022-09-29: 5.12 via INTRAVENOUS

## 2022-09-30 ENCOUNTER — Encounter: Payer: Self-pay | Admitting: Gastroenterology

## 2022-10-11 ENCOUNTER — Encounter (INDEPENDENT_AMBULATORY_CARE_PROVIDER_SITE_OTHER): Payer: Self-pay

## 2022-12-10 IMAGING — RF DG ESOPHAGUS
9 of 12 series · 15 of 22 positions shown · non-contrast
Comparison: Abdomen 10/23/2018.

CLINICAL DATA: Reflux.

EXAM:
ESOPHOGRAM / BARIUM SWALLOW / BARIUM TABLET STUDY
TECHNIQUE: Combined double contrast and single contrast examination performed
using effervescent crystals, thick barium liquid, and thin barium
liquid. The patient was observed with fluoroscopy swallowing a 13 mm
barium sulphate tablet.
FLUOROSCOPY TIME:  Fluoroscopy Time:  1 minutes 12 seconds
Radiation Exposure Index (if provided by the fluoroscopic device):
44.8 mGy

[Series 1: fluoro_barium 2fps_bw · 0.18mm/px · 3 of 7 frames shown (1 of 8)]
[frame 2/7]
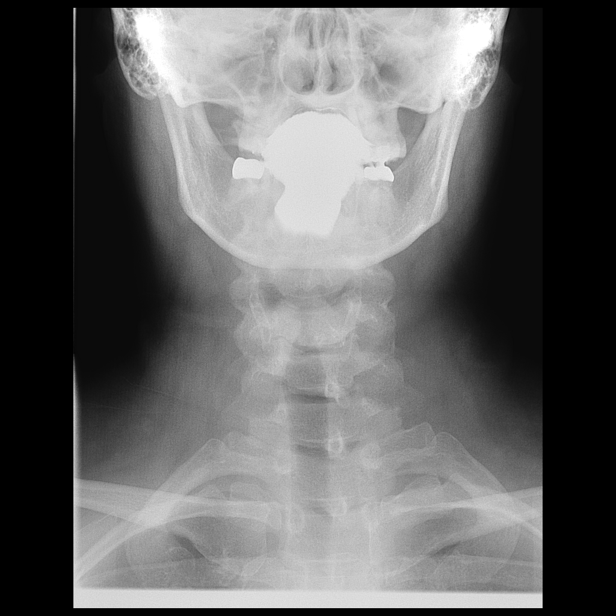
[frame 4/7]
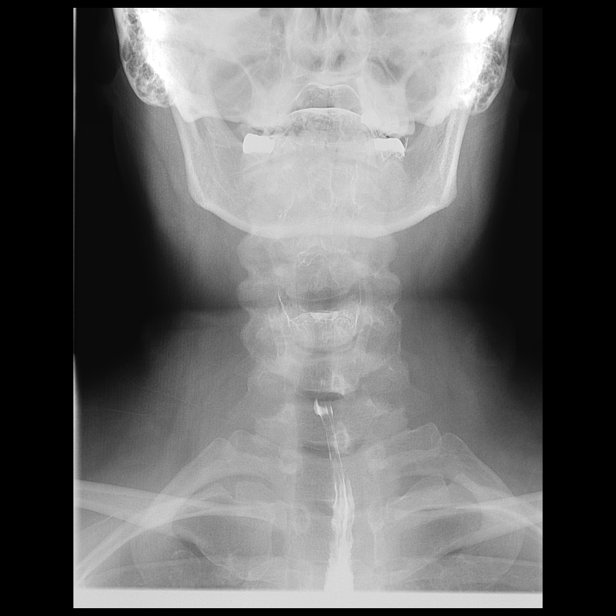
[frame 6/7]
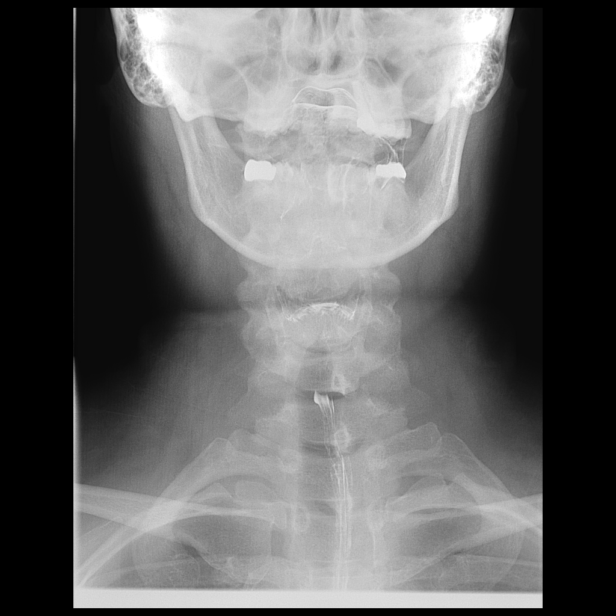

[Series 2: fluoro_barium 2fps_bw · 0.18mm/px · 2 of 5 frames shown (2 of 8)]
[frame 2/5]
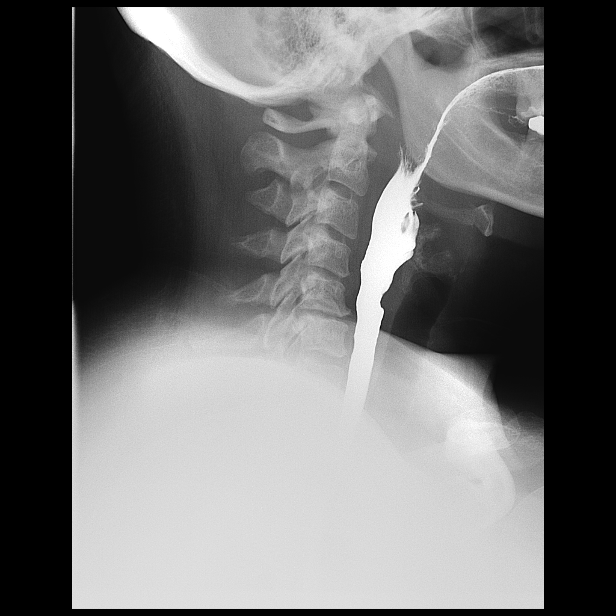
[frame 3/5]
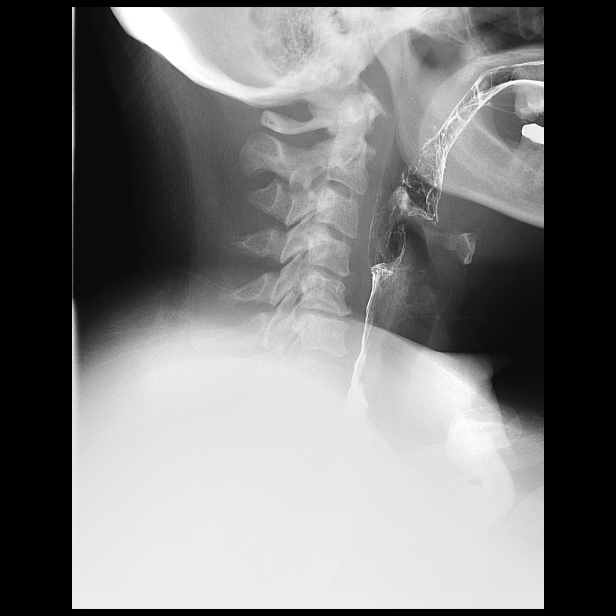

[Series 3: fluoro_barium 2fps_bw · 0.18mm/px · 1 of 1 slices shown (3 of 8)]
[im 1/1]
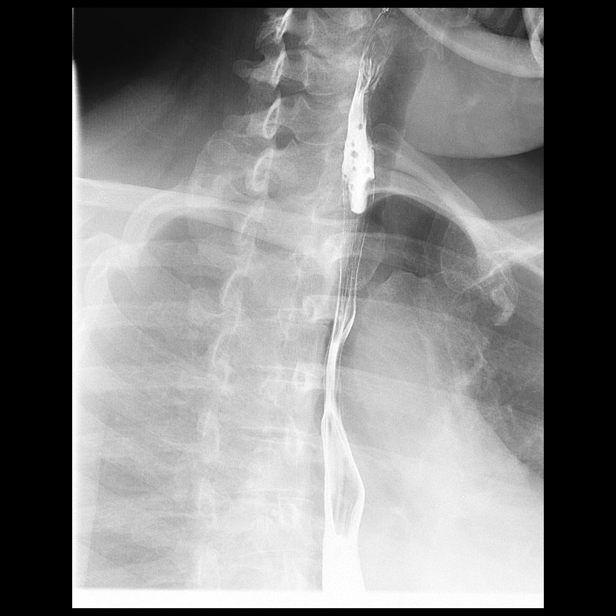

[Series 4: fluoro_barium 2fps_bw · 0.18mm/px · 1 of 1 slices shown (4 of 8)]
[im 1/1]
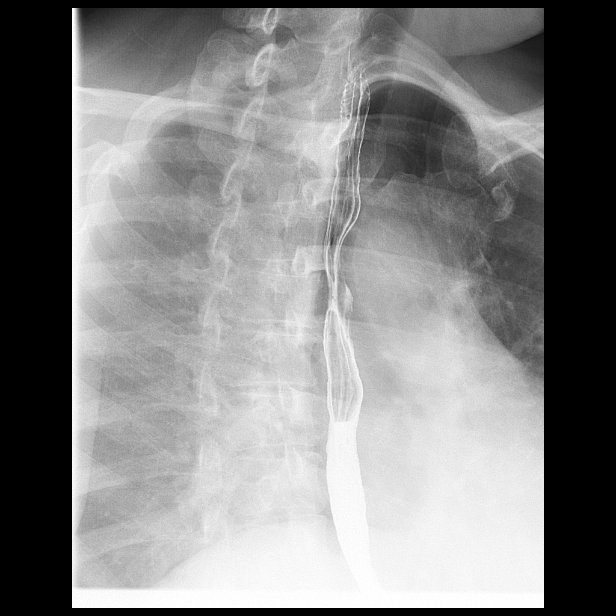

[Series 6: fluoro_barium 2fps_bw · 0.18mm/px · 1 of 1 slices shown (5 of 8)]
[im 1/1]
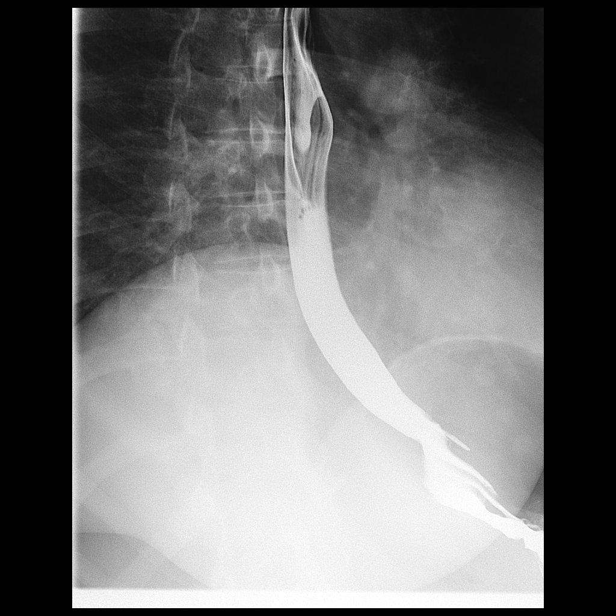

[Series 7: fluoro_barium 2fps_bw · 0.18mm/px · 2 of 2 frames shown (6 of 8)]
[frame 1/2]
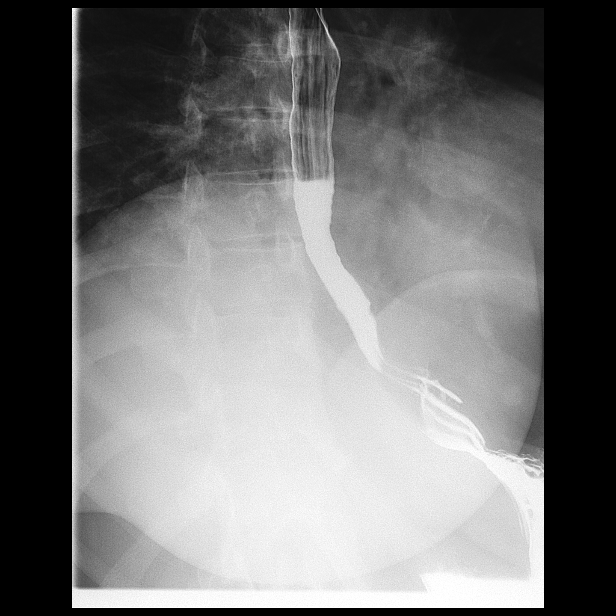
[frame 2/2]
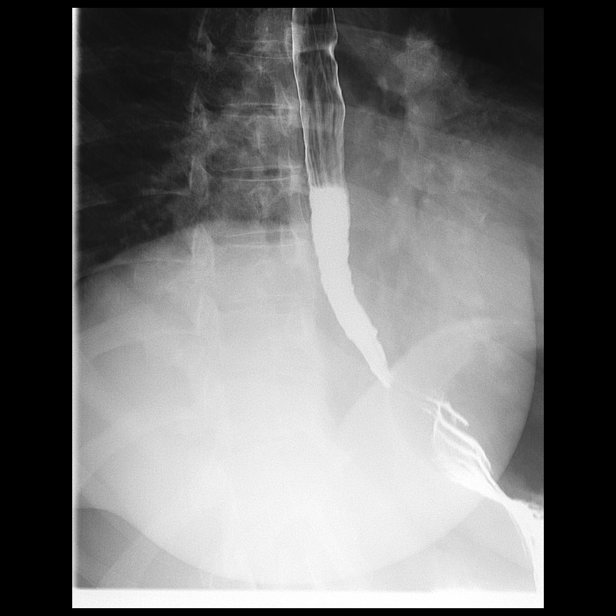

[Series 9: fluoro_barium 2fps_bw · 0.18mm/px · 1 of 1 slices shown (7 of 8)]
[im 1/1]
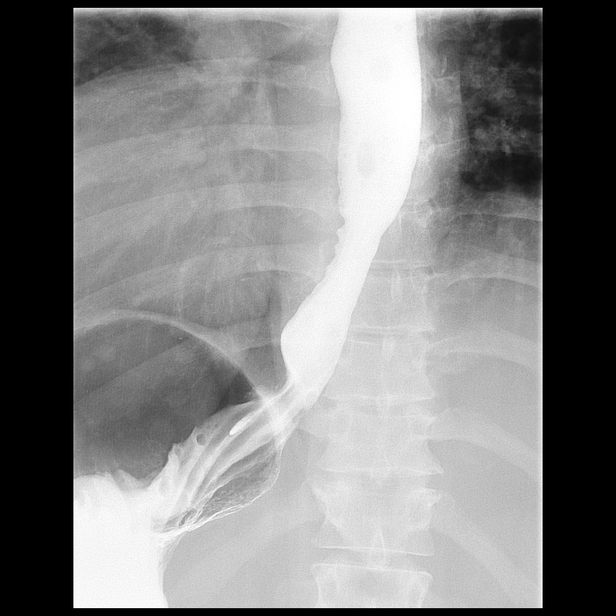

[Series 10: fluoro_barium 2fps_bw · 0.18mm/px · 1 of 1 slices shown (8 of 8)]
[im 1/1]
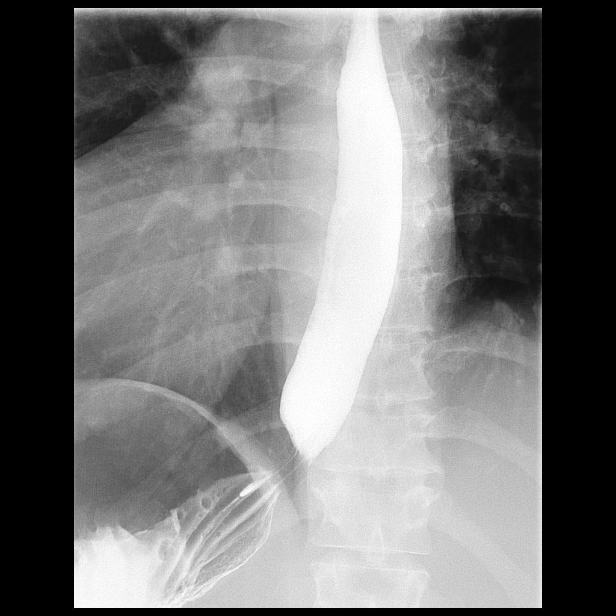

[Series 12: cp_standard · 0.18mm/px · 3 of 60 frames shown]
[frame 10/60]
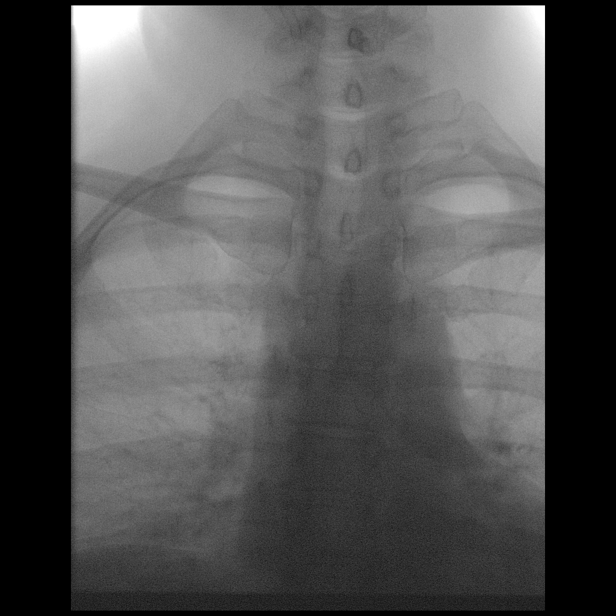
[frame 31/60]
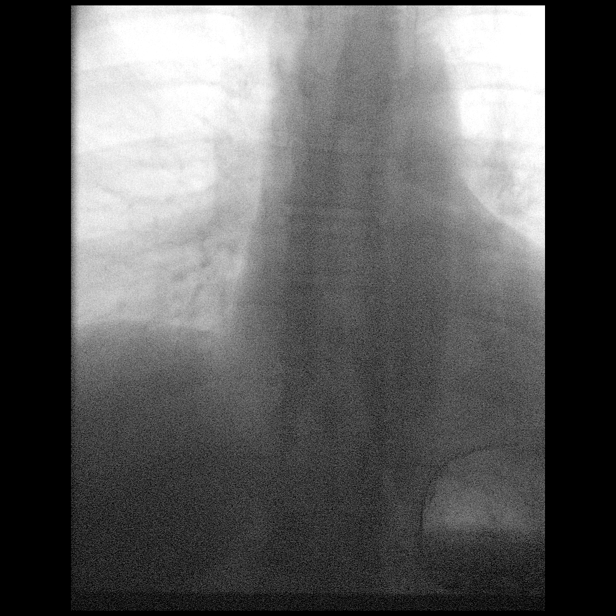
[frame 54/60]
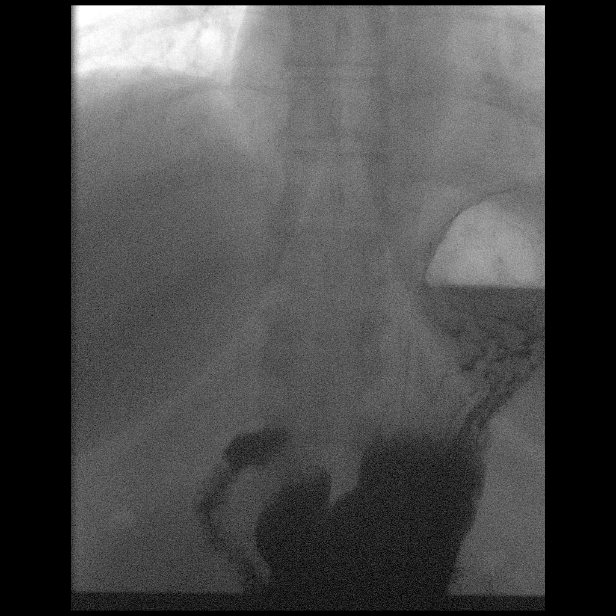

[15 of 22 positions shown; findings below may reference images not displayed]

FINDINGS: Cervical esophagus is normal. Thoracic esophagus is normal. No focal
esophageal abnormality identified. Peristalsis normal. No
significant reflux noted. Standard barium tablet passes normally.
IMPRESSION: Normal exam.

## 2023-01-04 ENCOUNTER — Ambulatory Visit: Payer: Federal, State, Local not specified - PPO | Admitting: Podiatry

## 2023-01-04 DIAGNOSIS — B351 Tinea unguium: Secondary | ICD-10-CM | POA: Diagnosis not present

## 2023-01-04 DIAGNOSIS — M79675 Pain in left toe(s): Secondary | ICD-10-CM | POA: Diagnosis not present

## 2023-01-04 DIAGNOSIS — M79674 Pain in right toe(s): Secondary | ICD-10-CM

## 2023-01-04 MED ORDER — TERBINAFINE HCL 250 MG PO TABS: 250.0000 mg | ORAL_TABLET | Freq: Every day | ORAL | 0 refills | Status: AC

## 2023-01-04 MED ORDER — CLOTRIMAZOLE-BETAMETHASONE 1-0.05 % EX CREA: 1.0000 | TOPICAL_CREAM | Freq: Two times a day (BID) | CUTANEOUS | 2 refills | Status: AC

## 2023-01-04 NOTE — Progress Notes (Signed)
   Chief Complaint  Patient presents with   Nail Problem    Nail fungus       Subjective: 47 y.o. female presenting today for follow-up evaluation of fungal nail infection to the bilateral toes as well as chronic tinea pedis.  Patient last seen in the office 12/21/2019.  At that time Lamisil was prescribed but the patient states that she never took the medication.  She has not really done anything for treatment of the past 2 years.  Presenting for further treatment evaluation  Past Medical History:  Diagnosis Date   Allergic rhinitis    Anemia    Anxiety    GERD (gastroesophageal reflux disease)    Herpes genitalia    Prediabetes    Sjogren's disease ([place])    Sleep apnea     Past Surgical History:  Procedure Laterality Date   APPENDECTOMY      Allergies  Allergen Reactions   Iodinated Contrast Media Hives, Shortness Of Breath, Swelling and Anaphylaxis   Iodine Anaphylaxis, Hives, Shortness Of Breath and Swelling   Other Hives    Pea causes diarrhea  Apples cause hives   Citalopram Other (See Comments)    Nausea, myalgias   Corn-Containing Products     Other reaction(s): Other (See Comments) Is sensitive to this   Eggs Or Egg-Derived Products Diarrhea   Gluten Meal Diarrhea    Celiac's disease   Molds & Smuts Hives   Pea Diarrhea    01/04/2023  01/04/2023  Objective: Physical Exam General: The patient is alert and oriented x3 in no acute distress.  Dermatology: Hyperkeratotic, discolored, thickened, onychodystrophy noted to the right great toe as well as the lesser digits of the left foot and left great toe. Skin is warm, dry and supple bilateral lower extremities.  Diffuse hyperkeratosis of skin noted throughout the weightbearing surface of the bilateral feet with superficial fissuring  Vascular: Palpable pedal pulses bilaterally. No edema or erythema noted. Capillary refill within normal limits.  Neurological: Grossly intact via light touch Musculoskeletal  Exam: No pedal deformity noted  Assessment: #1 Onychomycosis of toenails #2 chronic tinea pedis both  Plan of Care:  #1 Patient was evaluated. #2  Today we discussed different treatment options including oral, topical, and laser antifungal treatment modalities.  We discussed their efficacies and side effects.  Patient opts for oral antifungal treatment modality #3 prescription for Lamisil 250 mg #90 daily. Pt denies a history of liver pathology or symptoms.  Comprehensive metabolic panel 06/11/5283 liver enzymes WNL #4  Prescription for Lotrisone cream apply 2 times daily #5 return to clinic 2 months   Edrick Kins, DPM [facility]  Dr. Edrick Kins, DPM    [address], [place] [address]                Office [phone]  Fax [phone]

## 2023-02-14 ENCOUNTER — Encounter: Payer: Self-pay | Admitting: Family

## 2023-02-14 ENCOUNTER — Ambulatory Visit: Payer: Federal, State, Local not specified - PPO | Admitting: Family

## 2023-02-14 VITALS — BP 126/70 | HR 62 | Temp 97.8°F | Ht 64.0 in | Wt 271.8 lb

## 2023-02-14 DIAGNOSIS — M3500 Sicca syndrome, unspecified: Secondary | ICD-10-CM

## 2023-02-14 DIAGNOSIS — K900 Celiac disease: Secondary | ICD-10-CM

## 2023-02-14 DIAGNOSIS — J3089 Other allergic rhinitis: Secondary | ICD-10-CM

## 2023-02-14 DIAGNOSIS — M2550 Pain in unspecified joint: Secondary | ICD-10-CM

## 2023-02-14 DIAGNOSIS — R231 Pallor: Secondary | ICD-10-CM

## 2023-02-14 DIAGNOSIS — G4733 Obstructive sleep apnea (adult) (pediatric): Secondary | ICD-10-CM | POA: Diagnosis not present

## 2023-02-14 DIAGNOSIS — I10 Essential (primary) hypertension: Secondary | ICD-10-CM

## 2023-02-14 DIAGNOSIS — E559 Vitamin D deficiency, unspecified: Secondary | ICD-10-CM

## 2023-02-14 DIAGNOSIS — I890 Lymphedema, not elsewhere classified: Secondary | ICD-10-CM

## 2023-02-14 DIAGNOSIS — Z6841 Body Mass Index (BMI) 40.0 and over, adult: Secondary | ICD-10-CM

## 2023-02-14 MED ORDER — METHYLPREDNISOLONE 4 MG PO TBPK: ORAL_TABLET | ORAL | 0 refills | Status: AC

## 2023-02-14 NOTE — Assessment & Plan Note (Signed)
Continue supplementation  ?

## 2023-02-14 NOTE — Patient Instructions (Signed)
  Welcome to our clinic, I am happy to have you as my new patient. I am excited to continue on this healthcare journey with you.  Stop by the lab prior to leaving today. I will notify you of your results once received.   Please keep in mind Any my chart messages you send have up to a three business day turnaround for a response.  Phone calls may take up to a one full business day turnaround for a  response.   If you need a medication refill I recommend you request it through the pharmacy as this is easiest for us rather than sending a message and or phone call.   Due to recent changes in healthcare laws, you may see results of your imaging and/or laboratory studies on MyChart before I have had a chance to review them.  I understand that in some cases there may be results that are confusing or concerning to you. Please understand that not all results are received at the same time and often I may need to interpret multiple results in order to provide you with the best plan of care or course of treatment. Therefore, I ask that you please give me 2 business days to thoroughly review all your results before contacting my office for clarification. Should we see a critical lab result, you will be contacted sooner.   It was a pleasure seeing you today! Please do not hesitate to reach out with any questions and or concerns.  Regards,   Jordie Skalsky FNP-C  

## 2023-02-14 NOTE — Progress Notes (Unsigned)
New Patient Office Visit  Subjective:  Patient ID: Brenda Ross, female    DOB: 09-04-1976  Age: 47 y.o. MRN: [id number]  CC:  Chief Complaint  Patient presents with   Establish Care    HPI Brenda Ross is here to establish care as a new patient.  Oriented to practice routines and expectations.  Prior provider was: Gladstone Lighter MD   Pt is with acute concerns.  Patient worried about her memory as of late, has noticed it about four years ago.  Patient concerned as sometimes she slurs her words or can not think of the right word to say as well. Does have b12 def, she is on b12 shots for every two weeks for three months, and then monthly after that. Worried because her grandmother had dementia. Concerned because it is starting to impact her job.  She does have an appt 02/24/23.      chronic concerns:  Celiac disease: does maintain a strict gluten free diet.   Allergic rhinitis: taking xyzal for allergies. Can't really tolerate nose spray and also on Singulair 10 mg once daily. She has increased allergy symptoms.   HTN: losartan 100 mg and diltiazem 180 mg once daily.   GERD: pepcid 30 mg once daily twice daily as well as protonix 40 mg twice daily. Does see GI.    SVTs and PVCs, has been elevated per patient over the last two months. Has not seen cardiologist in a while, will make f/u.   Obesity: wegovy 0.25 mg weekly.   Anxiety/depression: sertraline 100 mg once daily. Sees a therapist every two weeks.   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   Sjogrens, celiac, mass cell activation:   Prediabetic:  Lab Results  Component Value Date   HGBA1C 5.7 (H) 04/10/2020   Vitamin d def: taking otc supplementation.   Mild anemia:  Lab Results  Component Value Date   WBC 7.8 10/03/2020   HGB 10.8 (L) 10/03/2020   HCT 35.3 10/03/2020   MCV 87 10/03/2020   PLT 292 10/03/2020   Bil meniscus: seeing orthopedist for physical therapy as well.         ROS: Negative unless specifically indicated above in HPI.   Current Outpatient Medications:    buPROPion (WELLBUTRIN) 100 MG tablet, Take 100 mg by mouth 1 day or 1 dose., Disp: , Rfl:    clotrimazole-betamethasone (LOTRISONE) cream, Apply 1 Application topically 2 (two) times daily., Disp: 45 g, Rfl: 2   diltiazem (CARDIZEM CD) 180 MG 24 hr capsule, Take 180 mg by mouth daily., Disp: , Rfl:    EPINEPHrine 0.3 mg/0.3 mL IJ SOAJ injection, Inject 0.3 mLs (0.3 mg total) into the muscle as needed for anaphylaxis., Disp: 2 each, Rfl: 1   famotidine (PEPCID) 40 MG tablet, TAKE ONE TABLET BY MOUTH TWICE A DAY, Disp: 60 tablet, Rfl: 1   levocetirizine (XYZAL ALLERGY 24HR) 5 MG tablet, , Disp: , Rfl:    losartan (COZAAR) 100 MG tablet, Take 1 tablet by mouth daily., Disp: , Rfl:    Melatonin 3 MG CAPS, Take by mouth., Disp: , Rfl:    methylPREDNISolone (MEDROL DOSEPAK) 4 MG TBPK tablet, Take per package instructions, Disp: 21 tablet, Rfl: 0   montelukast (SINGULAIR) 10 MG tablet, Take 1 tablet (10 mg total) by mouth daily., Disp: 90 tablet, Rfl: 1   pantoprazole (PROTONIX) 40 MG tablet, Take 1 tablet (40 mg total) by mouth 2 (two) times daily., Disp: 60 tablet, Rfl:  6   sertraline (ZOLOFT) 100 MG tablet, Take 100 mg by mouth daily., Disp: , Rfl:    terbinafine (LAMISIL) 250 MG tablet, Take 1 tablet (250 mg total) by mouth daily., Disp: 90 tablet, Rfl: 0   valACYclovir (VALTREX) 500 MG tablet, Take by mouth., Disp: , Rfl:    WEGOVY 0.25 MG/0.5ML SOAJ, Inject into the skin., Disp: , Rfl:  Past Medical History:  Diagnosis Date   Allergic rhinitis    Anemia    Anxiety    GERD (gastroesophageal reflux disease)    Herpes genitalia    Prediabetes    Sjogren's disease ([place])    Sleep apnea    Past Surgical History:  Procedure Laterality Date   APPENDECTOMY      Objective:   Today's Vitals: BP 126/70   Pulse 62   Temp 97.8 F (36.6 C) (Temporal)   Ht '5\' 4"'$  (1.626 m)   Wt 271 lb  12.8 oz (123.3 kg)   SpO2 98%   BMI 46.65 kg/m   Physical Exam Constitutional:      General: She is not in acute distress.    Appearance: Normal appearance. She is obese. She is not ill-appearing, toxic-appearing or diaphoretic.  HENT:     Head: Normocephalic.     Left Ear: A middle ear effusion (clear fluid behind ear.) is present.  Cardiovascular:     Rate and Rhythm: Normal rate and regular rhythm.  Pulmonary:     Effort: Pulmonary effort is normal.  Musculoskeletal:        General: Normal range of motion.  Neurological:     General: No focal deficit present.     Mental Status: She is alert and oriented to person, place, and time. Mental status is at baseline.  Psychiatric:        Mood and Affect: Mood normal.        Behavior: Behavior normal.        Thought Content: Thought content normal.        Judgment: Judgment normal.     Assessment & Plan:  Vitamin D deficiency Assessment & Plan: Continue supplementation.    Lymphedema  Sjogren's syndrome, with unspecified organ involvement ([place])  Obstructive sleep apnea syndrome  Allergic rhinitis due to other allergic trigger, unspecified seasonality -     methylPREDNISolone; Take per package instructions  Dispense: 21 tablet; Refill: 0  Polyarthralgia    Follow-up: No follow-ups on file.   Eugenia Pancoast, FNP

## 2023-02-15 NOTE — Assessment & Plan Note (Signed)
Pt to continue with a gluten free diet.

## 2023-02-15 NOTE — Assessment & Plan Note (Signed)
Continue f/u as scheduled with rheumatology

## 2023-02-15 NOTE — Assessment & Plan Note (Signed)
Stable.  Continue losartan 100 mg once daily.

## 2023-02-15 NOTE — Assessment & Plan Note (Signed)
Stable. 

## 2023-02-15 NOTE — Assessment & Plan Note (Signed)
Pt advised to work on diet and exercise as tolerated

## 2023-02-15 NOTE — Assessment & Plan Note (Signed)
Continue cpap as prescribed.

## 2023-02-15 NOTE — Assessment & Plan Note (Signed)
Today with fluid in ears, and bil ear fullness will give dose of medrol dose pack, will also help with polyarthralgia.

## 2023-02-16 ENCOUNTER — Encounter: Payer: Self-pay | Admitting: Family

## 2023-04-05 ENCOUNTER — Ambulatory Visit: Payer: Federal, State, Local not specified - PPO | Admitting: Podiatry

## 2023-04-26 ENCOUNTER — Other Ambulatory Visit: Payer: Self-pay | Admitting: Family

## 2023-04-26 ENCOUNTER — Ambulatory Visit: Payer: Federal, State, Local not specified - PPO | Admitting: Podiatry

## 2023-04-26 DIAGNOSIS — J3089 Other allergic rhinitis: Secondary | ICD-10-CM

## 2023-06-10 ENCOUNTER — Other Ambulatory Visit: Payer: Self-pay

## 2023-06-10 DIAGNOSIS — R4189 Other symptoms and signs involving cognitive functions and awareness: Secondary | ICD-10-CM

## 2023-06-10 DIAGNOSIS — R1030 Lower abdominal pain, unspecified: Secondary | ICD-10-CM

## 2023-06-17 ENCOUNTER — Ambulatory Visit: Payer: Federal, State, Local not specified - PPO

## 2023-09-07 ENCOUNTER — Ambulatory Visit: Payer: Federal, State, Local not specified - PPO | Attending: Internal Medicine

## 2023-09-07 DIAGNOSIS — R42 Dizziness and giddiness: Secondary | ICD-10-CM | POA: Insufficient documentation

## 2023-09-07 DIAGNOSIS — R2681 Unsteadiness on feet: Secondary | ICD-10-CM | POA: Insufficient documentation

## 2023-09-07 NOTE — Therapy (Deleted)
OUTPATIENT PHYSICAL THERAPY VESTIBULAR EVALUATION  Patient Name: Brenda Ross MRN: [id number] DOB:Sep 14, 1976, 47 y.o., female Today's Date: 09/07/2023  END OF SESSION:   Past Medical History:  Diagnosis Date   Allergic rhinitis    Anemia    Anxiety    GERD (gastroesophageal reflux disease)    Herpes genitalia    Prediabetes    Sjogren's disease (H[facility])    Sleep apnea    Past Surgical History:  Procedure Laterality Date   APPENDECTOMY     Patient Active Problem List   Diagnosis Date Noted   Vitamin D deficiency 02/14/2023   Sjogren's syndrome (HCC) 02/14/2023   Chronic venous insufficiency 02/23/2021   Adult celiac disease 08/11/2020   Benign essential hypertension 08/11/2020   Adjustment disorder with anxious mood 08/08/2020   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   Memory difficulties 08/08/2020   BMI 45.0-49.9, adult (HCC) 07/25/2020   Genital herpes simplex 10/17/2018   Palpitations 09/01/2018   GERD (gastroesophageal reflux disease)    Prediabetes    Sleep apnea    Anemia    Allergic rhinitis    Family history of catecholaminergic polymorphic ventricular tachycardia (CPVT) 07/08/2016   IGT (impaired glucose tolerance) 07/08/2016   PCP: Mort Sawyers, FNP  REFERRING PROVIDER: Enid Baas, MD   REFERRING DIAG: ***  RATIONALE FOR EVALUATION AND TREATMENT: {HABREHAB:27488}  THERAPY DIAG: Dizziness and giddiness  Unsteadiness on feet  ONSET DATE: ***  FOLLOW-UP APPT SCHEDULED WITH REFERRING PROVIDER: {yes/no:20286}    SUBJECTIVE:   Chief Complaint:  ***  Pertinent History ***  Description of dizziness: Frequency:  Duration: Symptom nature: {DizzyNature:27369} Progression of symptoms since onset: {DizzyProgress:27370} History of similar episodes: {yes/no:20286}  Provocative Factors: Easing Factors:  Auditory complaints (tinnitus, pain, drainage, hearing loss, aural fullness): {yes/no:20286} Vision changes (diplopia, visual field loss,  recent changes, recent eye exam): {yes/no:20286} Chest pain/palpitations: {yes/no:20286} History of head injury/concussion: {yes/no:20286} Stress/anxiety: {yes/no:20286} Migraines/headaches: {yes/no:20286} Nausea/vomiting: {yes/no:20286} Numbness/tingling: {yes/no:20286} Focal weakness: {yes/no:20286} Dysarthria/dysphagia/drop attacks: {yes/no:20286}  Has patient fallen in last 6 months? {yes/no:20286}, Number of falls: *** Pertinent pain: {yes/no:20286} Dominant hand: {RIGHT/LEFT:20294} Imaging: {yes/no:20286}  Prior level of function: {PLOF:24004} Occupational demands: Hobbies:  Red Flags: Negative for bowel/bladder changes, personal history of cancer, chills/fever, night sweats, unexplained weight loss/gain  PRECAUTIONS: {Therapy precautions:24002}  WEIGHT BEARING RESTRICTIONS {Yes ***/No:24003}  LIVING ENVIRONMENT: Lives with: {[facility] lives with:25569::"lives with their family"} Lives in: {Lives in:25570} Stairs: {yes/no:20286}; {Stairs:24000} Has following equipment at home: {Assistive devices:23999}  PATIENT GOALS ***   OBJECTIVE EXAMINATION  POSTURE: No gross deficits contributing to symptoms  NEUROLOGICAL SCREEN: (2+ unless otherwise noted.) N=normal  Ab=abnormal  Level Dermatome R L Myotome R L Reflex R L  C3 Anterior Neck N N Sidebend C2-3 N N Jaw CN V    C4 Top of Shoulder N N Shoulder Shrug C4 N N Hoffman's UMN    C5 Lateral Upper Arm N N Shoulder ABD C4-5 N N Biceps C5-6    C6 Lateral Arm/ Thumb N N Arm Flex/ Wrist Ext C5-6 N N Brachiorad. C5-6    C7 Middle Finger N N Arm Ext//Wrist Flex C6-7 N N Triceps C7    C8 4th & 5th Finger N N Flex/ Ext Carpi Ulnaris C8 N N Patellar (L3-4)    T1 Medial Arm N N Interossei T1 N N Gastrocnemius    L2 Medial thigh/groin N N Illiopsoas (L2-3) N N     L3 Lower thigh/med.knee N N Quadriceps (L3-4) N N     L4 Medial leg/lat thigh  N N Tibialis Ant (L4-5) N N     L5 Lat. leg & dorsal foot N N EHL (L5) N N     S1 post/lat  foot/thigh/leg N N Gastrocnemius (S1-2) N N     S2 Post./med. thigh & leg N N Hamstrings (L4-S3) N N      CRANIAL NERVES II, III, IV, VI: Pupils equal and reactive to light, visual acuity and visual fields are intact, extraocular muscles are intact  V: Facial sensation is intact and symmetric bilaterally  VII: Facial strength is intact and symmetric bilaterally  VIII: Hearing is normal as tested by gross conversation IX, X: Palate elevates midline, normal phonation, uvula midline XI: Shoulder shrug strength is intact  XII: Tongue protrudes midline   COORDINATION Finger to Nose: Normal Heel to Shin: Normal Pronator Drift: Negative Rapid Alternating Movements: Normal Finger to Thumb Opposition: Normal   RANGE OF MOTION Cervical Spine AROM WFL and painless in all planes. No functional focal deficits in AROM noted in BUE/BLE  MANUAL MUSCLE TESTING BUE/BLE strength WNL without focal deficits  TRANSFERS/GAIT Independent for transfers and ambulation without assistive device   PATIENT SURVEYS FOTO: ,predicted improvement to  ABC: % DHI: /100  OCULOMOTOR / VESTIBULAR TESTING  Oculomotor Exam- Room Light  Findings Comments  Ocular Alignment {normal/abnormal/not examined:14677}   Ocular ROM {normal/abnormal/not examined:14677}   Spontaneous Nystagmus {normal/abnormal/not examined:14677}   Gaze-Holding Nystagmus {normal/abnormal/not examined:14677}   End-Gaze Nystagmus {normal/abnormal/not examined:14677}   Vergence (normal 2-3") {normal/abnormal/not examined:14677}   Smooth Pursuit {normal/abnormal/not examined:14677}   Cross-Cover Test {normal/abnormal/not examined:14677}   Saccades {normal/abnormal/not examined:14677}   VOR Cancellation {normal/abnormal/not examined:14677}   Left Head Impulse {normal/abnormal/not examined:14677}   Right Head Impulse {normal/abnormal/not examined:14677}   Static Acuity {normal/abnormal/not examined:14677}   Dynamic Acuity {normal/abnormal/not  examined:14677}    Oculomotor Exam- Fixation Suppressed  Findings Comments  Ocular Alignment {normal/abnormal/not examined:14677}   Spontaneous Nystagmus {normal/abnormal/not examined:14677}   Gaze-Holding Nystagmus {normal/abnormal/not examined:14677}   End-Gaze Nystagmus {normal/abnormal/not examined:14677}   Head Shaking Nystagmus {normal/abnormal/not examined:14677}   Pressure-Induced Nystagmus {normal/abnormal/not examined:14677}   Hyperventilation Induced Nystagmus {normal/abnormal/not examined:14677}   Skull Vibration Induced Nystagmus {normal/abnormal/not examined:14677}    BPPV TESTS:  Symptoms Duration Intensity Nystagmus  L Dix-Hallpike None   None  R Dix-Hallpike None   None  L Head Roll None   None  R Head Roll None   None  L Sidelying Test      R Sidelying Test      (blank = not tested)  Clinical Test of Sensory Interaction for Balance (CTSIB): CONDITION TIME SWAY  Eyes open, firm surface 30 seconds   Eyes closed, firm surface 30 seconds   Eyes open, foam surface 30 seconds   Eyes closed, foam surface 30 seconds     FUNCTIONAL OUTCOME MEASURES  Results Comments  BERG    DGI    FGA    TUG    5TSTS    6 Minute Walk Test    10 Meter Gait Speed    (blank = not tested)   TODAY'S TREATMENT  Deferred   PATIENT EDUCATION:  Education details: Plan of care Person educated: Patient Education method: Explanation Education comprehension: verbalized understanding   HOME EXERCISE PROGRAM:  None currently   ASSESSMENT: CLINICAL IMPRESSION: Patient is a *** y.o. *** who was seen today for physical therapy evaluation and treatment for ***.   OBJECTIVE IMPAIRMENTS: {opptimpairments:25111}.   ACTIVITY LIMITATIONS: {activitylimitations:27494}  PARTICIPATION LIMITATIONS: {participationrestrictions:25113}  PERSONAL FACTORS: {Personal factors:25162} are also affecting patient's  functional outcome.   REHAB POTENTIAL: {rehabpotential:25112}  CLINICAL  DECISION MAKING: {clinical decision making:25114}  EVALUATION COMPLEXITY: {Evaluation complexity:25115}   GOALS:  SHORT TERM GOALS: Target date: {follow up:25551}  Pt will be independent with HEP for dizziness in order to decrease symptoms, improve balance,decrease fall risk, and improve function at home. Baseline: Goal status: INITIAL   LONG TERM GOALS: Target date: {follow up:25551}  Pt will increase FOTO to at least *** to demonstrate significant improvement in function at home related to dizziness.  Baseline:  Goal status: {GOALSTATUS:25110}  2.  Pt will decrease DHI score by at least 18 points in order to demonstrate clinically significant reduction in disability related to dizziness.  Baseline: *** Goal status: {GOALSTATUS:25110}  3.  Pt will improve ABC by at least 13% in order to demonstrate clinically significant improvement in balance confidence.      Baseline: *** Goal status: {GOALSTATUS:25110}  4. Pt will decrease 5TSTS by at least 3 seconds in order to demonstrate clinically significant improvement in LE strength      Baseline: *** Goal status: {GOALSTATUS:25110}  5. Pt will improve DGI by at least 3 points in order to demonstrate clinically significant improvement in balance and decreased risk for falls.     Baseline: *** Goal status: {GOALSTATUS:25110}  6. Pt will decrease TUG to below 14 seconds/decrease in order to demonstrate decreased fall risk.  Baseline: *** Goal status: {GOALSTATUS:25110}  7. Pt will increase by at least 3m (159ft) in order to demonstrate clinically significant improvement in cardiopulmonary endurance and community ambulation   Baseline: *** Goal status: {GOALSTATUS:25110}   PLAN: PT FREQUENCY: 1x/week  PT DURATION: 8 weeks  PLANNED INTERVENTIONS: Therapeutic exercises, Therapeutic activity, Neuromuscular re-education, Balance training, Gait training, Patient/Family education, Self Care, Joint mobilization, Joint  manipulation, Vestibular training, Canalith repositioning, Orthotic/Fit training, DME instructions, Dry Needling, Electrical stimulation, Spinal manipulation, Spinal mobilization, Cryotherapy, Moist heat, Taping, Traction, Ultrasound, Ionotophoresis 4mg /ml Dexamethasone, Manual therapy, and Re-evaluation.  PLAN FOR NEXT SESSION: ***   Sharalyn Ink Deanne Bedgood PT, DPT, GCS  Brenda Ross, PT 09/07/2023, 12:33 PM

## 2023-09-22 ENCOUNTER — Ambulatory Visit: Payer: Federal, State, Local not specified - PPO | Attending: Internal Medicine

## 2023-09-22 DIAGNOSIS — R42 Dizziness and giddiness: Secondary | ICD-10-CM | POA: Insufficient documentation

## 2023-09-22 NOTE — Therapy (Addendum)
OUTPATIENT PHYSICAL THERAPY VESTIBULAR EVALUATION  Patient Name: Brenda Ross MRN: [id number] DOB:12/22/1975, 47 y.o., female Today's Date: 09/25/2023  END OF SESSION:  PT End of Session - 09/25/23 2109     Visit Number 1    Number of Visits 9    Date for PT Re-Evaluation 11/17/23    Authorization Type Eval: 09/22/2023    PT Start Time 1025    PT Stop Time 1100    PT Time Calculation (min) 35 min    Activity Tolerance Patient tolerated treatment well    Behavior During Therapy WFL for tasks assessed/performed            Past Medical History:  Diagnosis Date   Allergic rhinitis    Anemia    Anxiety    GERD (gastroesophageal reflux disease)    Herpes genitalia    Prediabetes    Sjogren's disease (HCC)    Sleep apnea    Past Surgical History:  Procedure Laterality Date   APPENDECTOMY     Patient Active Problem List   Diagnosis Date Noted   Vitamin D deficiency 02/14/2023   Sjogren's syndrome (HCC) 02/14/2023   Chronic venous insufficiency 02/23/2021   Adult celiac disease 08/11/2020   Benign essential hypertension 08/11/2020   Adjustment disorder with anxious mood 08/08/2020   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   Memory difficulties 08/08/2020   BMI 45.0-49.9, adult (HCC) 07/25/2020   Genital herpes simplex 10/17/2018   Palpitations 09/01/2018   GERD (gastroesophageal reflux disease)    Prediabetes    Sleep apnea    Anemia    Allergic rhinitis    Family history of catecholaminergic polymorphic ventricular tachycardia (CPVT) 07/08/2016   IGT (impaired glucose tolerance) 07/08/2016   PCP: Mort Sawyers, FNP  REFERRING PROVIDER: Enid Baas, MD   REFERRING DIAG: R42 (ICD-10-CM) - Dizziness and giddiness   RATIONALE FOR EVALUATION AND TREATMENT: Rehabilitation  THERAPY DIAG: Dizziness and giddiness - Plan: PT plan of care cert/re-cert  ONSET DATE: 08/22/2021  FOLLOW-UP APPT SCHEDULED WITH REFERRING PROVIDER: Yes   SUBJECTIVE:   Chief  Complaint:  Dizziness   Pertinent History Patient arrives to physical therapy with a report of unsteadiness and disequilibrium in her left ear within the last two years. She reports that her dizziness has been worsening within the last two months following a fall in September. She reported descending stairs at night with her puppy in her arms and suddenly lost her balance. Pars fracture of L5. Pt reported significant bruise on her lower back. Seen in the emergency room. Within the last couple months she reports that the symptoms have affected her driving; a couple instances requiring her to pullover secondary to symptoms. She reports that she experiences impaired proprioception and frequently bumps her extremities into door ways. Patient reports frequent bouts of tripping or her foot catching throughout the day. Currently she denies fevers, chills, vomiting and saddle parasthesia.    Imaging (Per Chart Review):  CT of the head C-spine and L-spine reviewed. No acute fracture, retrolisthesis of L5 on S1.   Description of dizziness: Disequilibrium  Frequency: Constant Duration: Constant Symptom nature: constant and positional Progression of symptoms since onset: worse History of similar episodes: Yes  Provocative Factors: Bending Over, Looking Down Easing Factors: None reported  Auditory complaints (tinnitus, pain, drainage, hearing loss, aural fullness): Yes, "Muffled tones", "sounds like water" Vision changes (diplopia, visual field loss, recent changes, recent eye exam): Yes "Blurriness". Chest pain/palpitations: Yes History of head injury/concussion: No Stress/anxiety: Yes Migraines/headaches: Yes Nausea/vomiting:  Yes Numbness/tingling: Yes Posterior Thigh Focal weakness: No Dysarthria/dysphagia/drop attacks: No  Has patient fallen in last 6 months? Yes, Number of falls: 1 Pertinent pain: Yes, low back pain Dominant hand: right Imaging: Yes, see history; Prior level of function:  Independent Hobbies: Gardening, Staining, Puppies   Red Flags: Negative for bowel/bladder changes, personal history of cancer, chills/fever, night sweats, unexplained weight loss/gain  PRECAUTIONS: Fall  WEIGHT BEARING RESTRICTIONS No  LIVING ENVIRONMENT: Lives with: lives with their family Lives in: House/apartment Stairs: Yes; Internal: 14 steps; can reach both Has following equipment at home: None  PATIENT GOALS: " patient would like decrease symptoms"   OBJECTIVE EXAMINATION  POSTURE: No gross deficits contributing to symptoms  NEUROLOGICAL SCREEN: (2+ unless otherwise noted.) N=normal  Ab=abnormal  Level Dermatome R L Myotome R L Reflex R L  C3 Anterior Neck N N Sidebend C2-3 N N Jaw CN V    C4 Top of Shoulder N N Shoulder Shrug C4 N N Hoffman's UMN    C5 Lateral Upper Arm N N Shoulder ABD C4-5 N N Biceps C5-6    C6 Lateral Arm/ Thumb N N Arm Flex/ Wrist Ext C5-6 N N Brachiorad. C5-6    C7 Middle Finger N N Arm Ext//Wrist Flex C6-7 N N Triceps C7    C8 4th & 5th Finger N N Flex/ Ext Carpi Ulnaris C8 N N Patellar (L3-4)    T1 Medial Arm N N Interossei T1 N N Gastrocnemius    L2 Medial thigh/groin N N Illiopsoas (L2-3) N N     L3 Lower thigh/med.knee N N Quadriceps (L3-4) N N     L4 Medial leg/lat thigh N N Tibialis Ant (L4-5) N N     L5 Lat. leg & dorsal foot N N EHL (L5) N N     S1 post/lat foot/thigh/leg N N Gastrocnemius (S1-2) N N     S2 Post./med. thigh & leg N N Hamstrings (L4-S3) N N      CRANIAL NERVES Deferred  COORDINATION Deferred    RANGE OF MOTION Cervical Spine AROM WFL and painless in all planes. No functional focal deficits in AROM noted in BUE/BLE  MANUAL MUSCLE TESTING BUE strength WNL without focal deficits  TRANSFERS/GAIT Independent for transfers and ambulation without assistive device   PATIENT SURVEYS FOTO: To be completed ABC: To be completed DHI: To be completed  OCULOMOTOR / VESTIBULAR TESTING  Oculomotor Exam- Room Light   Findings Comments  Ocular Alignment normal   Ocular ROM normal   Spontaneous Nystagmus normal   Gaze-Holding Nystagmus normal   End-Gaze Nystagmus normal   Vergence (normal 2-3") not examined   Smooth Pursuit normal   Cross-Cover Test not examined   Saccades normal   VOR Cancellation normal   Left Head Impulse normal   Right Head Impulse abnormal  Corrective saccade noted  Static Acuity not examined   Dynamic Acuity not examined    Oculomotor Exam- Fixation Suppressed  Findings Comments  Ocular Alignment not examined   Spontaneous Nystagmus not examined   Gaze-Holding Nystagmus not examined   End-Gaze Nystagmus not examined   Head Shaking Nystagmus not examined   Pressure-Induced Nystagmus not examined   Hyperventilation Induced Nystagmus not examined   Skull Vibration Induced Nystagmus not examined    BPPV TESTS:  Symptoms Duration Intensity Nystagmus  L Dix-Hallpike Dizziness < 10s  Mild  None  R Dix-Hallpike None   None  L Head Roll None   None  R Head Roll None  None  L Sidelying Test      R Sidelying Test      (blank = not tested)  Clinical Test of Sensory Interaction for Balance (CTSIB): Deferred   FUNCTIONAL OUTCOME MEASURES  Results Comments  BERG    DGI    FGA    TUG    5TSTS    6 Minute Walk Test    10 Meter Gait Speed    (blank = not tested)   TODAY'S TREATMENT  Deferred   PATIENT EDUCATION:  Education details: Plan of care Person educated: Patient Education method: Explanation Education comprehension: verbalized understanding   HOME EXERCISE PROGRAM:  None currently   ASSESSMENT: CLINICAL IMPRESSION: Patient is a 47 y.o. female who was seen today for physical therapy evaluation and treatment for dizziness and giddiness. Currently patient presents with impaired balance, decreased activity tolerance, impaired proprioception, impaired LE strength and dizziness. Objective findings were negative for BPPV in bilateral dix-hallpike and  horizontal head roll test. 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 Patient presented with moderate back pain with supine position throughout BPPV testing. Due to time constraints, remainder of session PT discussed plan of care with patient. Plan to include additional testing in order to determine additional impairments associated with symptoms. Based on today's findings Pt will benefit from PT services to address deficits in strength and balance in order to return to full function at home.   OBJECTIVE IMPAIRMENTS: decreased activity tolerance, decreased balance, difficulty walking, decreased strength, dizziness, and impaired vision/preception.   ACTIVITY LIMITATIONS: carrying, bending, standing, squatting, and stairs  PARTICIPATION LIMITATIONS: driving, community activity, and yard work  PERSONAL FACTORS: Age, Fitness, Sex, Time since onset of injury/illness/exacerbation, and 1-2 comorbidities: Anxiety, Sjogren's, HTN, anemia  are also affecting patient's functional outcome.   REHAB POTENTIAL: Fair    CLINICAL DECISION MAKING: Unstable/unpredictable  EVALUATION COMPLEXITY: High   GOALS:  SHORT TERM GOALS: Target date: 10/20/2023   Pt will be independent with HEP for dizziness in order to decrease symptoms, improve balance,decrease fall risk, and improve function at home. Baseline: Goal status: INITIAL   LONG TERM GOALS: Target date: 11/17/2023  Pt will increase FOTO to at least  to demonstrate significant improvement in function at home related to dizziness.  Baseline: To be completed   Goal status: INITIAL  2.  Pt will decrease DHI score by at least 18 points in order to demonstrate clinically significant reduction in disability related to dizziness.  Baseline: To be completed  Goal status: INITIAL  3.  Pt will improve ABC by at least 13% in order to demonstrate clinically significant improvement in balance  confidence.      Baseline: To be completed  Goal status: INITIAL  4. Pt will decrease 5TSTS by at least 3 seconds in order to demonstrate clinically significant improvement in LE strength      Baseline: To be completed  Goal status: INITIAL  5. Pt will improve FGA score by at least 4 points in order to demonstrate clinically significant improvement in balance and decreased risk for falls.     Baseline: To be completed Goal status: INITIAL  PLAN: PT FREQUENCY: 1x/week  PT DURATION: 8 weeks  PLANNED INTERVENTIONS: Therapeutic exercises, Therapeutic activity, Neuromuscular re-education, Balance training, Gait training, Patient/Family education, Self Care, Joint mobilization, Joint manipulation, Vestibular training, Canalith repositioning, Orthotic/Fit training, DME instructions, Dry Needling, Electrical stimulation, Spinal manipulation, Spinal mobilization, Cryotherapy, Moist heat, Taping, Traction, Ultrasound, Ionotophoresis 4mg /ml Dexamethasone, Manual  therapy, and Re-evaluation.  PLAN FOR NEXT SESSION: mCTSIB, ABC, DHI, Fixation Testing, FGA, 5TSTS, Initiate balance exercises  Christene Lye, SPT Sharalyn Ink Huprich PT, DPT, GCS  09/25/2023 9:30 PM

## 2023-09-30 ENCOUNTER — Ambulatory Visit: Payer: Federal, State, Local not specified - PPO

## 2023-09-30 DIAGNOSIS — R42 Dizziness and giddiness: Secondary | ICD-10-CM

## 2023-09-30 NOTE — Therapy (Cosign Needed)
OUTPATIENT PHYSICAL THERAPY VESTIBULAR TREATMENT  Patient Name: Brenda Ross MRN: [id number] DOB:09/09/1976, 47 y.o., female Today's Date: 10/01/2023  END OF SESSION:  PT End of Session - 09/30/23 1022     Visit Number 2    Number of Visits 9    Date for PT Re-Evaluation 11/17/23    Authorization Type Eval: 09/22/2023    PT Start Time 1025    PT Stop Time 1100    PT Time Calculation (min) 35 min    Activity Tolerance Patient tolerated treatment well    Behavior During Therapy WFL for tasks assessed/performed            Past Medical History:  Diagnosis Date   Allergic rhinitis    Anemia    Anxiety    GERD (gastroesophageal reflux disease)    Herpes genitalia    Prediabetes    Sjogren's disease (HCC)    Sleep apnea    Past Surgical History:  Procedure Laterality Date   APPENDECTOMY     Patient Active Problem List   Diagnosis Date Noted   Vitamin D deficiency 02/14/2023   Sjogren's syndrome (HCC) 02/14/2023   Chronic venous insufficiency 02/23/2021   Adult celiac disease 08/11/2020   Benign essential hypertension 08/11/2020   Adjustment disorder with anxious mood 08/08/2020   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   Memory difficulties 08/08/2020   BMI 45.0-49.9, adult (HCC) 07/25/2020   Genital herpes simplex 10/17/2018   Palpitations 09/01/2018   GERD (gastroesophageal reflux disease)    Prediabetes    Sleep apnea    Anemia    Allergic rhinitis    Family history of catecholaminergic polymorphic ventricular tachycardia (CPVT) 07/08/2016   IGT (impaired glucose tolerance) 07/08/2016   PCP: Mort Sawyers, FNP  REFERRING PROVIDER: Enid Baas, MD   REFERRING DIAG: R42 (ICD-10-CM) - Dizziness and giddiness   RATIONALE FOR EVALUATION AND TREATMENT: Rehabilitation  THERAPY DIAG: Dizziness and giddiness  ONSET DATE: 08/22/2021  FOLLOW-UP APPT SCHEDULED WITH REFERRING PROVIDER: Yes   SUBJECTIVE:   Chief Complaint:  Dizziness   Pertinent  History Patient arrives to physical therapy with a report of unsteadiness and disequilibrium in her left ear within the last two years. She reports that her dizziness has been worsening within the last two months following a fall in September. She reported descending stairs at night with her puppy in her arms and suddenly lost her balance. Pars fracture of L5. Pt reported significant bruise on her lower back. Seen in the emergency room. Within the last couple months she reports that the symptoms have affected her driving; a couple instances requiring her to pullover secondary to symptoms. She reports that she experiences impaired proprioception and frequently bumps her extremities into door ways. Patient reports frequent bouts of tripping or her foot catching throughout the day. Currently she denies fevers, chills, vomiting and saddle parasthesia.    Imaging (Per Chart Review):  CT of the head C-spine and L-spine reviewed. No acute fracture, retrolisthesis of L5 on S1.   Description of dizziness: Disequilibrium  Frequency: Constant Duration: Constant Symptom nature: constant and positional Progression of symptoms since onset: worse History of similar episodes: Yes  Provocative Factors: Bending Over, Looking Down Easing Factors: None reported  Auditory complaints (tinnitus, pain, drainage, hearing loss, aural fullness): Yes, "Muffled tones", "sounds like water" Vision changes (diplopia, visual field loss, recent changes, recent eye exam): Yes "Blurriness". Chest pain/palpitations: Yes History of head injury/concussion: No Stress/anxiety: Yes Migraines/headaches: Yes Nausea/vomiting: Yes Numbness/tingling: Yes Posterior Thigh Focal weakness:  No Dysarthria/dysphagia/drop attacks: No  Has patient fallen in last 6 months? Yes, Number of falls: 1 Pertinent pain: Yes, low back pain Dominant hand: right Imaging: Yes, see history; Prior level of function: Independent Hobbies: Gardening,  Staining, Puppies   Red Flags: Negative for bowel/bladder changes, personal history of cancer, chills/fever, night sweats, unexplained weight loss/gain  PRECAUTIONS: Fall  WEIGHT BEARING RESTRICTIONS No  LIVING ENVIRONMENT: Lives with: lives with their family Lives in: House/apartment Stairs: Yes; Internal: 14 steps; can reach both Has following equipment at home: None  PATIENT GOALS: " patient would like decrease symptoms"   OBJECTIVE EXAMINATION  POSTURE: No gross deficits contributing to symptoms  NEUROLOGICAL SCREEN: (2+ unless otherwise noted.) N=normal  Ab=abnormal  Level Dermatome R L Myotome R L Reflex R L  C3 Anterior Neck N N Sidebend C2-3 N N Jaw CN V    C4 Top of Shoulder N N Shoulder Shrug C4 N N Hoffman's UMN    C5 Lateral Upper Arm N N Shoulder ABD C4-5 N N Biceps C5-6    C6 Lateral Arm/ Thumb N N Arm Flex/ Wrist Ext C5-6 N N Brachiorad. C5-6    C7 Middle Finger N N Arm Ext//Wrist Flex C6-7 N N Triceps C7    C8 4th & 5th Finger N N Flex/ Ext Carpi Ulnaris C8 N N Patellar (L3-4)    T1 Medial Arm N N Interossei T1 N N Gastrocnemius    L2 Medial thigh/groin N N Illiopsoas (L2-3) N N     L3 Lower thigh/med.knee N N Quadriceps (L3-4) N N     L4 Medial leg/lat thigh N N Tibialis Ant (L4-5) N N     L5 Lat. leg & dorsal foot N N EHL (L5) N N     S1 post/lat foot/thigh/leg N N Gastrocnemius (S1-2) N N     S2 Post./med. thigh & leg N N Hamstrings (L4-S3) N N      CRANIAL NERVES Deferred  COORDINATION Deferred    RANGE OF MOTION Cervical Spine AROM WFL and painless in all planes. No functional focal deficits in AROM noted in BUE/BLE  MANUAL MUSCLE TESTING BUE strength WNL without focal deficits  TRANSFERS/GAIT Independent for transfers and ambulation without assistive device   PATIENT SURVEYS FOTO: To be completed ABC: To be completed DHI: To be completed  OCULOMOTOR / VESTIBULAR TESTING  Oculomotor Exam- Room Light  Findings Comments  Ocular  Alignment normal   Ocular ROM normal   Spontaneous Nystagmus normal   Gaze-Holding Nystagmus normal   End-Gaze Nystagmus normal   Vergence (normal 2-3") not examined   Smooth Pursuit normal   Cross-Cover Test not examined   Saccades normal   VOR Cancellation normal   Left Head Impulse normal   Right Head Impulse abnormal  Corrective saccade noted  Static Acuity not examined   Dynamic Acuity not examined    Oculomotor Exam- Fixation Suppressed  Findings Comments  Ocular Alignment not examined   Spontaneous Nystagmus not examined   Gaze-Holding Nystagmus not examined   End-Gaze Nystagmus not examined   Head Shaking Nystagmus not examined   Pressure-Induced Nystagmus not examined   Hyperventilation Induced Nystagmus not examined   Skull Vibration Induced Nystagmus not examined    BPPV TESTS:  Symptoms Duration Intensity Nystagmus  L Dix-Hallpike Dizziness < 10s  Mild  None  R Dix-Hallpike None   None  L Head Roll None   None  R Head Roll None   None  L Sidelying Test  R Sidelying Test      (blank = not tested)  Clinical Test of Sensory Interaction for Balance (CTSIB): Deferred   FUNCTIONAL OUTCOME MEASURES  Results Comments  BERG    DGI    FGA    TUG    5TSTS    6 Minute Walk Test    10 Meter Gait Speed    (blank = not tested)   TODAY'S TREATMENT   Subjective: Patient arrived to physical therapy with no reportS of recent falls. She reported a couple episodes of near syncope. No further questions or concerns.   Pain: No resting pain   Neuromuscular Re-education  FGA: 20/30 : 1.05 m/s self selected ABC: 31.8%   Clinical Test of Sensory Interaction for Balance (CTSIB): CONDITION TIME SWAY  Eyes open, firm surface 30 seconds   Eyes closed, firm surface 30 seconds Minor Lateral Sway   Eyes open, foam surface 30 seconds Minor Posterior Sway   Eyes closed, foam surface 30 seconds    Minor AP Sway    Oculomotor Exam- Fixation Suppressed  Findings  Comments  Ocular Alignment Normal    Spontaneous Nystagmus Normal   Gaze-Holding Nystagmus Normal    End-Gaze Nystagmus Normal    Head Shaking Nystagmus Normal   Pressure-Induced Nystagmus Normal    Hyperventilation Induced Nystagmus Normal   Skull Vibration Induced Nystagmus Normal     Educated patient on habituation and VOR x 1; VOR x 1 horizontal in sitting 2 x 60s (mild dizziness reported);   PATIENT EDUCATION:  Education details: Plan of care Person educated: Patient Education method: Explanation Education comprehension: verbalized understanding   HOME EXERCISE PROGRAM:  Access Code: [email or web address] URL: [email or web address] Date: 09/30/2023 Prepared by: Ria Comment  Exercises - Seated Gaze Stabilization with Head Rotation  - 4 x daily - 7 x weekly - 3 reps - 60 seconds hold   ASSESSMENT: CLINICAL IMPRESSION: Today's session limited due to time constraint. Additional objective findings today significant for dizziness with VORx1 and horizontal head turns during gait. Patient reports additional dizziness, imbalance with eyes closed; moderate lateral deviation with eyes closed during gait. PT performed fixation testing; absent of nystagmus or reports of vertigo/dizziness during all tests. PT plans for next session to start habituation exercises to improve dynamic balance. Based on today's findings pt will benefit from PT services to address deficits in strength and balance in order to return to full function at home.   OBJECTIVE IMPAIRMENTS: decreased activity tolerance, decreased balance, difficulty walking, decreased strength, dizziness, and impaired vision/preception.   ACTIVITY LIMITATIONS: carrying, bending, standing, squatting, and stairs  PARTICIPATION LIMITATIONS: driving, community activity, and yard work  PERSONAL FACTORS: Age, Fitness, Sex, Time since onset of injury/illness/exacerbation, and 1-2 comorbidities: Anxiety, Sjogren's, HTN, anemia  are also  affecting patient's functional outcome.   REHAB POTENTIAL: Fair    CLINICAL DECISION MAKING: Unstable/unpredictable  EVALUATION COMPLEXITY: High   GOALS:  SHORT TERM GOALS: Target date: 10/20/2023   Pt will be independent with HEP for dizziness in order to decrease symptoms, improve balance,decrease fall risk, and improve function at home. Baseline: Goal status: INITIAL   LONG TERM GOALS: Target date: 11/17/2023  Pt will increase FOTO to at least  to demonstrate significant improvement in function at home related to dizziness.  Baseline: To be completed   Goal status: INITIAL  2.  Pt will decrease DHI score by at least 18 points in order to demonstrate clinically significant reduction in disability related to dizziness.  Baseline: To be completed  Goal status: INITIAL  3.  Pt will improve ABC by at least 13% in order to demonstrate clinically significant improvement in balance confidence.      Baseline: 09/30/23: 31.8% Goal status: INITIAL  4. Pt will decrease 5TSTS by at least 3 seconds in order to demonstrate clinically significant improvement in LE strength      Baseline: To be completed  Goal status: INITIAL  5. Pt will improve FGA score by at least 4 points in order to demonstrate clinically significant improvement in balance and decreased risk for falls.     Baseline: To be completed Goal status: INITIAL  PLAN: PT FREQUENCY: 1x/week  PT DURATION: 8 weeks  PLANNED INTERVENTIONS: Therapeutic exercises, Therapeutic activity, Neuromuscular re-education, Balance training, Gait training, Patient/Family education, Self Care, Joint mobilization, Joint manipulation, Vestibular training, Canalith repositioning, Orthotic/Fit training, DME instructions, Dry Needling, Electrical stimulation, Spinal manipulation, Spinal mobilization, Cryotherapy, Moist heat, Taping, Traction, Ultrasound, Ionotophoresis 4mg /ml Dexamethasone, Manual therapy, and Re-evaluation.  PLAN FOR NEXT  SESSION: DHI, FOTO, 5TSTS, review HEP, initiate balance exercises  Cristal Deer Estephanie Hubbs, SPT Sharalyn Ink Huprich PT, DPT, GCS  10/01/2023 3:40 PM

## 2023-10-11 ENCOUNTER — Other Ambulatory Visit: Payer: Self-pay | Admitting: Internal Medicine

## 2023-10-11 DIAGNOSIS — Z1231 Encounter for screening mammogram for malignant neoplasm of breast: Secondary | ICD-10-CM

## 2023-10-13 ENCOUNTER — Ambulatory Visit: Payer: Federal, State, Local not specified - PPO

## 2023-10-13 DIAGNOSIS — R42 Dizziness and giddiness: Secondary | ICD-10-CM | POA: Diagnosis not present

## 2023-10-13 NOTE — Therapy (Addendum)
OUTPATIENT PHYSICAL THERAPY VESTIBULAR TREATMENT  Patient Name: Brenda Ross MRN: [id number] DOB:[birth year], 47 y.o., female Today's Date: 10/13/2023  END OF SESSION:  PT End of Session - 10/13/23 0812     Visit Number 3    Number of Visits 9    Date for PT Re-Evaluation 11/17/23    Authorization Type Eval: 09/22/2023    PT Start Time 0812    PT Stop Time 0845    PT Time Calculation (min) 33 min    Activity Tolerance Patient tolerated treatment well    Behavior During Therapy [facility] for tasks assessed/performed            Past Medical History:  Diagnosis Date   Allergic rhinitis    Anemia    Anxiety    GERD (gastroesophageal reflux disease)    Herpes genitalia    Prediabetes    Sjogren's disease (HCC)    Sleep apnea    Past Surgical History:  Procedure Laterality Date   APPENDECTOMY     Patient Active Problem List   Diagnosis Date Noted   Vitamin D deficiency 02/14/2023   Sjogren's syndrome (HCC) 02/14/2023   Chronic venous insufficiency 02/23/2021   Adult celiac disease 08/11/2020   Benign essential hypertension 08/11/2020   Adjustment disorder with anxious mood 08/08/2020   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   Memory difficulties 08/08/2020   BMI 45.0-49.9, adult (HCC) 07/25/2020   Genital herpes simplex 10/17/2018   Palpitations 09/01/2018   GERD (gastroesophageal reflux disease)    Prediabetes    Sleep apnea    Anemia    Allergic rhinitis    Family history of catecholaminergic polymorphic ventricular tachycardia (CPVT) 07/08/2016   IGT (impaired glucose tolerance) 07/08/2016   PCP: Mort Sawyers, FNP  REFERRING PROVIDER: Enid Baas, MD   REFERRING DIAG: R42 (ICD-10-CM) - Dizziness and giddiness   RATIONALE FOR EVALUATION AND TREATMENT: Rehabilitation  THERAPY DIAG: Dizziness and giddiness  ONSET DATE: 08/22/2021  FOLLOW-UP APPT SCHEDULED WITH REFERRING PROVIDER: Yes   SUBJECTIVE:   Chief Complaint:  Dizziness   Pertinent  History Patient arrives to physical therapy with a report of unsteadiness and disequilibrium in her left ear within the last two years. She reports that her dizziness has been worsening within the last two months following a fall in September. She reported descending stairs at night with her puppy in her arms and suddenly lost her balance. Pars fracture of L5. Pt reported significant bruise on her lower back. Seen in the emergency room. Within the last couple months she reports that the symptoms have affected her driving; a couple instances requiring her to pullover secondary to symptoms. She reports that she experiences impaired proprioception and frequently bumps her extremities into door ways. Patient reports frequent bouts of tripping or her foot catching throughout the day. Currently she denies fevers, chills, vomiting and saddle parasthesia.    Imaging (Per Chart Review):  CT of the head C-spine and L-spine reviewed. No acute fracture, retrolisthesis of L5 on S1.   Description of dizziness: Disequilibrium  Frequency: Constant Duration: Constant Symptom nature: constant and positional Progression of symptoms since onset: worse History of similar episodes: Yes  Provocative Factors: Bending Over, Looking Down Easing Factors: None reported  Auditory complaints (tinnitus, pain, drainage, hearing loss, aural fullness): Yes, "Muffled tones", "sounds like water" Vision changes (diplopia, visual field loss, recent changes, recent eye exam): Yes "Blurriness". Chest pain/palpitations: Yes History of head injury/concussion: No Stress/anxiety: Yes Migraines/headaches: Yes Nausea/vomiting: Yes Numbness/tingling: Yes Posterior Thigh Focal weakness:  No Dysarthria/dysphagia/drop attacks: No  Has patient fallen in last 6 months? Yes, Number of falls: 1 Pertinent pain: Yes, low back pain Dominant hand: right Imaging: Yes, see history; Prior level of function: Independent Hobbies: Gardening,  Staining, Puppies   Red Flags: Negative for bowel/bladder changes, personal history of cancer, chills/fever, night sweats, unexplained weight loss/gain  PRECAUTIONS: Fall  WEIGHT BEARING RESTRICTIONS No  LIVING ENVIRONMENT: Lives with: lives with their family Lives in: House/apartment Stairs: Yes; Internal: 14 steps; can reach both Has following equipment at home: None  PATIENT GOALS: " patient would like decrease symptoms"   OBJECTIVE EXAMINATION  POSTURE: No gross deficits contributing to symptoms  NEUROLOGICAL SCREEN: (2+ unless otherwise noted.) N=normal  Ab=abnormal  Level Dermatome R L Myotome R L Reflex R L  C3 Anterior Neck N N Sidebend C2-3 N N Jaw CN V    C4 Top of Shoulder N N Shoulder Shrug C4 N N Hoffman's UMN    C5 Lateral Upper Arm N N Shoulder ABD C4-5 N N Biceps C5-6    C6 Lateral Arm/ Thumb N N Arm Flex/ Wrist Ext C5-6 N N Brachiorad. C5-6    C7 Middle Finger N N Arm Ext//Wrist Flex C6-7 N N Triceps C7    C8 4th & 5th Finger N N Flex/ Ext Carpi Ulnaris C8 N N Patellar (L3-4)    T1 Medial Arm N N Interossei T1 N N Gastrocnemius    L2 Medial thigh/groin N N Illiopsoas (L2-3) N N     L3 Lower thigh/med.knee N N Quadriceps (L3-4) N N     L4 Medial leg/lat thigh N N Tibialis Ant (L4-5) N N     L5 Lat. leg & dorsal foot N N EHL (L5) N N     S1 post/lat foot/thigh/leg N N Gastrocnemius (S1-2) N N     S2 Post./med. thigh & leg N N Hamstrings (L4-S3) N N      CRANIAL NERVES Deferred  COORDINATION Deferred    RANGE OF MOTION Cervical Spine AROM WFL and painless in all planes. No functional focal deficits in AROM noted in BUE/BLE  MANUAL MUSCLE TESTING BUE strength WNL without focal deficits  TRANSFERS/GAIT Independent for transfers and ambulation without assistive device   PATIENT SURVEYS FOTO: To be completed ABC: To be completed DHI: To be completed  OCULOMOTOR / VESTIBULAR TESTING  Oculomotor Exam- Room Light  Findings Comments  Ocular  Alignment normal   Ocular ROM normal   Spontaneous Nystagmus normal   Gaze-Holding Nystagmus normal   End-Gaze Nystagmus normal   Vergence (normal 2-3") not examined   Smooth Pursuit normal   Cross-Cover Test not examined   Saccades normal   VOR Cancellation normal   Left Head Impulse normal   Right Head Impulse abnormal  Corrective saccade noted  Static Acuity not examined   Dynamic Acuity not examined    Oculomotor Exam- Fixation Suppressed  Findings Comments  Ocular Alignment not examined   Spontaneous Nystagmus not examined   Gaze-Holding Nystagmus not examined   End-Gaze Nystagmus not examined   Head Shaking Nystagmus not examined   Pressure-Induced Nystagmus not examined   Hyperventilation Induced Nystagmus not examined   Skull Vibration Induced Nystagmus not examined    BPPV TESTS:  Symptoms Duration Intensity Nystagmus  L Dix-Hallpike Dizziness < 10s  Mild  None  R Dix-Hallpike None   None  L Head Roll None   None  R Head Roll None   None  L Sidelying Test  R Sidelying Test      (blank = not tested)  Clinical Test of Sensory Interaction for Balance (CTSIB): Deferred   FUNCTIONAL OUTCOME MEASURES  Results Comments  BERG    DGI    FGA    TUG    5TSTS    6 Minute Walk Test    10 Meter Gait Speed    (blank = not tested)   TODAY'S TREATMENT   Subjective: Patient arrived to physical therapy with no reports of recent falls. She reported a couple episodes of dizziness when randomly turning in the kitchen. No further questions or concerns.   Pain: No resting pain   Neuromuscular Re-education  Re-Educated patient on habituation, adaptation and VOR x 1; VOR x 1 horizontal in sitting 2 x 60s (5/10 dizziness) (4/10 dizziness); VOR x 1 horizontal Standing 1 x 60s (5/10 dizziness);   Airex Pad Eyes Closed, Feet Parallel, Horizontal Head Turns  30s/bout x 1 bout; Airex Pad Eyes Closed, Feet Together, Horizontal Head Turns  30s/bout x 1 bout; Airex Pad Eyes  Closed, Feet Semi Tandem, Horizontal Head Turns 30s/bout x 1 bout;  Forward Gait  with Horizontal Ball Pass to PT with head-eye follow 3 x 70 ft Saccadic Eye movement between two targets (4/10 dizziness)   PATIENT EDUCATION:  Education details: Plan of care Person educated: Patient Education method: Explanation Education comprehension: verbalized understanding   HOME EXERCISE PROGRAM:  Access Code: [email or web address] URL: [email or web address] Date: 09/30/2023 Prepared by: Ria Comment  Exercises - Seated Gaze Stabilization with Head Rotation  - 4 x daily - 7 x weekly - 3 reps - 60 seconds hold   ASSESSMENT: CLINICAL IMPRESSION: Today's session limited due to time constraint. Today's session with a main focus on continued progression of both adaptation and habituation exercises. Dizziness noted with head on body movement, saccadic eye movements and VOR exercises. Pt endorsed concern about bed mobility and fall recovery training. PT plans to include proper bed mobility technique in future appts. Pt encouraged to adhere to VOR HEP. Based on today's findings pt will benefit from PT services to address deficits in strength and balance in order to return to full function at home.  OBJECTIVE IMPAIRMENTS: decreased activity tolerance, decreased balance, difficulty walking, decreased strength, dizziness, and impaired vision/preception.   ACTIVITY LIMITATIONS: carrying, bending, standing, squatting, and stairs  PARTICIPATION LIMITATIONS: driving, community activity, and yard work  PERSONAL FACTORS: Age, Fitness, Sex, Time since onset of injury/illness/exacerbation, and 1-2 comorbidities: Anxiety, Sjogren's, HTN, anemia  are also affecting patient's functional outcome.   REHAB POTENTIAL: Fair    CLINICAL DECISION MAKING: Unstable/unpredictable  EVALUATION COMPLEXITY: High   GOALS:  SHORT TERM GOALS: Target date: 10/20/2023   Pt will be independent with HEP for dizziness in  order to decrease symptoms, improve balance,decrease fall risk, and improve function at home. Baseline: Goal status: INITIAL   LONG TERM GOALS: Target date: 11/17/2023  Pt will increase FOTO to at least 56  to demonstrate significant improvement in function at home related to dizziness.  Baseline: 10/13/2023: 47 Goal status: IN PROGRESS  2.  Pt will decrease DHI score by at least 18 points in order to demonstrate clinically significant reduction in disability related to dizziness.  Baseline: To be completed  Goal status: INITIAL  3.  Pt will improve ABC by at least 13% in order to demonstrate clinically significant improvement in balance confidence.      Baseline: 09/30/23: 31.8% Goal status: INITIAL  4. Pt will decrease 5TSTS by at  least 3 seconds in order to demonstrate clinically significant improvement in LE strength      Baseline: 10/13/2023: 8.40s Goal status: INITIAL  5. Pt will improve FGA score by at least 4 points in order to demonstrate clinically significant improvement in balance and decreased risk for falls.     Baseline: To be completed Goal status: INITIAL  PLAN: PT FREQUENCY: 1x/week  PT DURATION: 8 weeks  PLANNED INTERVENTIONS: Therapeutic exercises, Therapeutic activity, Neuromuscular re-education, Balance training, Gait training, Patient/Family education, Self Care, Joint mobilization, Joint manipulation, Vestibular training, Canalith repositioning, Orthotic/Fit training, DME instructions, Dry Needling, Electrical stimulation, Spinal manipulation, Spinal mobilization, Cryotherapy, Moist heat, Taping, Traction, Ultrasound, Ionotophoresis 4mg /ml Dexamethasone, Manual therapy, and Re-evaluation.  PLAN FOR NEXT SESSION: DHI,  5TSTS, review HEP, initiate balance exercises  Cristal Deer Blaize Nipper, SPT Sharalyn Ink Huprich PT, DPT, GCS  10/13/2023 3:38 PM

## 2023-10-18 ENCOUNTER — Ambulatory Visit: Payer: Federal, State, Local not specified - PPO

## 2023-10-25 ENCOUNTER — Ambulatory Visit: Payer: Federal, State, Local not specified - PPO | Attending: Internal Medicine

## 2023-10-25 DIAGNOSIS — R42 Dizziness and giddiness: Secondary | ICD-10-CM | POA: Insufficient documentation

## 2023-10-25 NOTE — Therapy (Cosign Needed Addendum)
OUTPATIENT PHYSICAL THERAPY VESTIBULAR TREATMENT  Patient Name: Brenda Ross MRN: [id number] DOB:09-Mar-1976, 47 y.o., female Today's Date: 10/26/2023  END OF SESSION:  PT End of Session - 10/25/23 1708     Visit Number 3    Number of Visits 9    Date for PT Re-Evaluation 11/17/23    Authorization Type Eval: 09/22/2023    PT Start Time 1708    PT Stop Time 1747    PT Time Calculation (min) 39 min    Activity Tolerance Patient tolerated treatment well    Behavior During Therapy WFL for tasks assessed/performed            Past Medical History:  Diagnosis Date   Allergic rhinitis    Anemia    Anxiety    GERD (gastroesophageal reflux disease)    Herpes genitalia    Prediabetes    Sjogren's disease (HCC)    Sleep apnea    Past Surgical History:  Procedure Laterality Date   APPENDECTOMY     Patient Active Problem List   Diagnosis Date Noted   Vitamin D deficiency 02/14/2023   Sjogren's syndrome (HCC) 02/14/2023   Chronic venous insufficiency 02/23/2021   Adult celiac disease 08/11/2020   Benign essential hypertension 08/11/2020   Adjustment disorder with anxious mood 08/08/2020   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   Memory difficulties 08/08/2020   BMI 45.0-49.9, adult (HCC) 07/25/2020   Genital herpes simplex 10/17/2018   Palpitations 09/01/2018   GERD (gastroesophageal reflux disease)    Prediabetes    Sleep apnea    Anemia    Allergic rhinitis    Family history of catecholaminergic polymorphic ventricular tachycardia (CPVT) 07/08/2016   IGT (impaired glucose tolerance) 07/08/2016   PCP: Mort Sawyers, FNP  REFERRING PROVIDER: Enid Baas, MD   REFERRING DIAG: R42 (ICD-10-CM) - Dizziness and giddiness   RATIONALE FOR EVALUATION AND TREATMENT: Rehabilitation  THERAPY DIAG: Dizziness and giddiness  ONSET DATE: 08/22/2021  FOLLOW-UP APPT SCHEDULED WITH REFERRING PROVIDER: Yes   SUBJECTIVE:   Chief Complaint:  Dizziness   Pertinent  History Patient arrives to physical therapy with a report of unsteadiness and disequilibrium in her left ear within the last two years. She reports that her dizziness has been worsening within the last two months following a fall in September. She reported descending stairs at night with her puppy in her arms and suddenly lost her balance. Pars fracture of L5. Pt reported significant bruise on her lower back. Seen in the emergency room. Within the last couple months she reports that the symptoms have affected her driving; a couple instances requiring her to pullover secondary to symptoms. She reports that she experiences impaired proprioception and frequently bumps her extremities into door ways. Patient reports frequent bouts of tripping or her foot catching throughout the day. Currently she denies fevers, chills, vomiting and saddle parasthesia.    Imaging (Per Chart Review):  CT of the head C-spine and L-spine reviewed. No acute fracture, retrolisthesis of L5 on S1.   Description of dizziness: Disequilibrium  Frequency: Constant Duration: Constant Symptom nature: constant and positional Progression of symptoms since onset: worse History of similar episodes: Yes  Provocative Factors: Bending Over, Looking Down Easing Factors: None reported  Auditory complaints (tinnitus, pain, drainage, hearing loss, aural fullness): Yes, "Muffled tones", "sounds like water" Vision changes (diplopia, visual field loss, recent changes, recent eye exam): Yes "Blurriness". Chest pain/palpitations: Yes History of head injury/concussion: No Stress/anxiety: Yes Migraines/headaches: Yes Nausea/vomiting: Yes Numbness/tingling: Yes Posterior Thigh Focal weakness:  No Dysarthria/dysphagia/drop attacks: No  Has patient fallen in last 6 months? Yes, Number of falls: 1 Pertinent pain: Yes, low back pain Dominant hand: right Imaging: Yes, see history; Prior level of function: Independent Hobbies: Gardening,  Staining, Puppies   Red Flags: Negative for bowel/bladder changes, personal history of cancer, chills/fever, night sweats, unexplained weight loss/gain  PRECAUTIONS: Fall  WEIGHT BEARING RESTRICTIONS No  LIVING ENVIRONMENT: Lives with: lives with their family Lives in: House/apartment Stairs: Yes; Internal: 14 steps; can reach both Has following equipment at home: None  PATIENT GOALS: " patient would like decrease symptoms"   OBJECTIVE EXAMINATION  POSTURE: No gross deficits contributing to symptoms  NEUROLOGICAL SCREEN: (2+ unless otherwise noted.) N=normal  Ab=abnormal  Level Dermatome R L Myotome R L Reflex R L  C3 Anterior Neck N N Sidebend C2-3 N N Jaw CN V    C4 Top of Shoulder N N Shoulder Shrug C4 N N Hoffman's UMN    C5 Lateral Upper Arm N N Shoulder ABD C4-5 N N Biceps C5-6    C6 Lateral Arm/ Thumb N N Arm Flex/ Wrist Ext C5-6 N N Brachiorad. C5-6    C7 Middle Finger N N Arm Ext//Wrist Flex C6-7 N N Triceps C7    C8 4th & 5th Finger N N Flex/ Ext Carpi Ulnaris C8 N N Patellar (L3-4)    T1 Medial Arm N N Interossei T1 N N Gastrocnemius    L2 Medial thigh/groin N N Illiopsoas (L2-3) N N     L3 Lower thigh/med.knee N N Quadriceps (L3-4) N N     L4 Medial leg/lat thigh N N Tibialis Ant (L4-5) N N     L5 Lat. leg & dorsal foot N N EHL (L5) N N     S1 post/lat foot/thigh/leg N N Gastrocnemius (S1-2) N N     S2 Post./med. thigh & leg N N Hamstrings (L4-S3) N N      CRANIAL NERVES Deferred  COORDINATION Deferred    RANGE OF MOTION Cervical Spine AROM WFL and painless in all planes. No functional focal deficits in AROM noted in BUE/BLE  MANUAL MUSCLE TESTING BUE strength WNL without focal deficits  TRANSFERS/GAIT Independent for transfers and ambulation without assistive device   PATIENT SURVEYS FOTO: To be completed ABC: To be completed DHI: To be completed  OCULOMOTOR / VESTIBULAR TESTING  Oculomotor Exam- Room Light  Findings Comments  Ocular  Alignment normal   Ocular ROM normal   Spontaneous Nystagmus normal   Gaze-Holding Nystagmus normal   End-Gaze Nystagmus normal   Vergence (normal 2-3") not examined   Smooth Pursuit normal   Cross-Cover Test not examined   Saccades normal   VOR Cancellation normal   Left Head Impulse normal   Right Head Impulse abnormal  Corrective saccade noted  Static Acuity not examined   Dynamic Acuity not examined    Oculomotor Exam- Fixation Suppressed  Findings Comments  Ocular Alignment normal   Spontaneous Nystagmus normal   Gaze-Holding Nystagmus normal   End-Gaze Nystagmus normal   Head Shaking Nystagmus normal   Pressure-Induced Nystagmus normal   Hyperventilation Induced Nystagmus normal   Skull Vibration Induced Nystagmus normal Report of dizziness when applied to base of occiput   BPPV TESTS:  Symptoms Duration Intensity Nystagmus  L Dix-Hallpike Dizziness < 10s  Mild  None  R Dix-Hallpike None   None  L Head Roll None   None  R Head Roll None   None  L Sidelying Test  R Sidelying Test      (blank = not tested)  Clinical Test of Sensory Interaction for Balance (CTSIB): Deferred   FUNCTIONAL OUTCOME MEASURES  Results Comments  BERG    DGI    FGA    TUG    5TSTS    6 Minute Walk Test    10 Meter Gait Speed    (blank = not tested)   TODAY'S TREATMENT   Subjective: Patient reported ear "popping" as she was walking into the clinic. Patient states with sit to stand transfer, pressure sensation felt in posterior head that travels to the ear with "aural fullness sensation" and hearing "water" in the ear. Symptoms resolved upon seated position inside the clinic. No further questions or concerns    Pain: No resting pain or dizziness   Neuromuscular Re-education  VOR x 1 horizontal in sitting 2 x 60s (4/10 dizziness); VOR x 1 horizontal standing 1 x 60s (2/10 dizziness);   Eyes Closed, Feet Together, Horizontal Head Turns 30s/bout x 1 bout; (10/10 Dizziness,  with minor AP sway);  Seated Saccadic Eye movement between two targets  30s/ bout x 2 bouts, second set with farther distance (5/10 dizziness, 10/10 dizziness) Seated Head to Knee x 10 bilateral (3/10 Right Dizziness, 4/10 Dizziness Left with reported fullness in the ear); Blaze Pod x 6, Home base in front other pods 90 to the left,  30s/bout x 2 bouts (no dizziness reported with left head turns) Standing Bending and Reaching Task at cones floor, Alternating UE cone tap 2 x 10 (second set with busy environment on floor) (4/10 dizziness, 4/10 dizziness)    PATIENT EDUCATION:  Education details: Plan of care Person educated: Patient Education method: Explanation Education comprehension: verbalized understanding   HOME EXERCISE PROGRAM:  Access Code: [email or web address] URL: [email or web address] Date: 09/30/2023 Prepared by: Ria Comment  Exercises - Seated Gaze Stabilization with Head Rotation  - 4 x daily - 7 x weekly - 3 reps - 60 seconds hold   ASSESSMENT: CLINICAL IMPRESSION: Today's session with a main focus on habituation exercises and VOR progression. VOR exercises with slower pace due to increased reported blurriness in eyes. Increased dizziness reported with bending and reaching tasks, eyes closed tasks, and saccadic eye tracking task. PT plan to reassess fixation testing with Skull Vibration induced nystagmus. Pt encouraged to adhere to VOR HEP. Based on today's findings pt will benefit from PT services to address deficits in strength and balance in order to return to full function at home.  OBJECTIVE IMPAIRMENTS: decreased activity tolerance, decreased balance, difficulty walking, decreased strength, dizziness, and impaired vision/preception.   ACTIVITY LIMITATIONS: carrying, bending, standing, squatting, and stairs  PARTICIPATION LIMITATIONS: driving, community activity, and yard work  PERSONAL FACTORS: Age, Fitness, Sex, Time since onset of  injury/illness/exacerbation, and 1-2 comorbidities: Anxiety, Sjogren's, HTN, anemia  are also affecting patient's functional outcome.   REHAB POTENTIAL: Fair    CLINICAL DECISION MAKING: Unstable/unpredictable  EVALUATION COMPLEXITY: High   GOALS:  SHORT TERM GOALS: Target date: 10/20/2023   Pt will be independent with HEP for dizziness in order to decrease symptoms, improve balance,decrease fall risk, and improve function at home. Baseline: Goal status: INITIAL   LONG TERM GOALS: Target date: 11/17/2023  Pt will increase FOTO to at least 56  to demonstrate significant improvement in function at home related to dizziness.  Baseline: 10/13/2023: 48 Goal status: IN PROGRESS  2.  Pt will decrease DHI score by at least 18 points in order to demonstrate clinically significant  reduction in disability related to dizziness.  Baseline: 10/25/2023: 84 Goal status: INITIAL  3.  Pt will improve ABC by at least 13% in order to demonstrate clinically significant improvement in balance confidence.      Baseline: 09/30/23: 31.8% Goal status: INITIAL  4. Pt will decrease 5TSTS by at least 3 seconds in order to demonstrate clinically significant improvement in LE strength      Baseline: 10/13/2023: 8.40s Goal status: INITIAL  5. Pt will improve FGA score by at least 4 points in order to demonstrate clinically significant improvement in balance and decreased risk for falls.     Baseline: To be completed Goal status: INITIAL  PLAN: PT FREQUENCY: 1x/week  PT DURATION: 8 weeks  PLANNED INTERVENTIONS: Therapeutic exercises, Therapeutic activity, Neuromuscular re-education, Balance training, Gait training, Patient/Family education, Self Care, Joint mobilization, Joint manipulation, Vestibular training, Canalith repositioning, Orthotic/Fit training, DME instructions, Dry Needling, Electrical stimulation, Spinal manipulation, Spinal mobilization, Cryotherapy, Moist heat, Taping, Traction,  Ultrasound, Ionotophoresis 4mg /ml Dexamethasone, Manual therapy, and Re-evaluation.  PLAN FOR NEXT SESSION: Progress habituation exercises, fixation testing (skull vibration induced)   Christene Lye, SPT Sharalyn Ink Huprich PT, DPT, GCS  10/26/2023 10:44 AM

## 2023-11-01 ENCOUNTER — Ambulatory Visit: Payer: Federal, State, Local not specified - PPO

## 2023-11-01 DIAGNOSIS — R42 Dizziness and giddiness: Secondary | ICD-10-CM

## 2023-11-08 ENCOUNTER — Ambulatory Visit: Payer: Federal, State, Local not specified - PPO

## 2023-11-08 DIAGNOSIS — R42 Dizziness and giddiness: Secondary | ICD-10-CM

## 2023-11-16 NOTE — Therapy (Signed)
OUTPATIENT PHYSICAL THERAPY VESTIBULAR TREATMENT/RECERTIFICATION  Patient Name: Brenda Ross MRN: [id number] DOB:1976/08/26, 47 y.o., female Today's Date: 11/19/2023  END OF SESSION:  PT End of Session - 11/19/23 1858     Visit Number 4    Number of Visits 9    Date for PT Re-Evaluation 01/12/24    Authorization Type Eval: 09/22/2023, recert: 11/17/23    PT Start Time 1342    PT Stop Time 1415    PT Time Calculation (min) 33 min    Activity Tolerance Patient tolerated treatment well    Behavior During Therapy WFL for tasks assessed/performed            Past Medical History:  Diagnosis Date   Allergic rhinitis    Anemia    Anxiety    GERD (gastroesophageal reflux disease)    Herpes genitalia    Prediabetes    Sjogren's disease (HCC)    Sleep apnea    Past Surgical History:  Procedure Laterality Date   APPENDECTOMY     Patient Active Problem List   Diagnosis Date Noted   Vitamin D deficiency 02/14/2023   Sjogren's syndrome (HCC) 02/14/2023   Chronic venous insufficiency 02/23/2021   Adult celiac disease 08/11/2020   Benign essential hypertension 08/11/2020   Adjustment disorder with anxious mood 08/08/2020   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   Memory difficulties 08/08/2020   BMI 45.0-49.9, adult (HCC) 07/25/2020   Genital herpes simplex 10/17/2018   Palpitations 09/01/2018   GERD (gastroesophageal reflux disease)    Prediabetes    Sleep apnea    Anemia    Allergic rhinitis    Family history of catecholaminergic polymorphic ventricular tachycardia (CPVT) 07/08/2016   IGT (impaired glucose tolerance) 07/08/2016   PCP: Mort Sawyers, FNP  REFERRING PROVIDER: Enid Baas, MD   REFERRING DIAG: R42 (ICD-10-CM) - Dizziness and giddiness   RATIONALE FOR EVALUATION AND TREATMENT: Rehabilitation  THERAPY DIAG: Dizziness and giddiness  ONSET DATE: 08/22/2021  FOLLOW-UP APPT SCHEDULED WITH REFERRING PROVIDER: Yes   SUBJECTIVE:   Chief  Complaint:  Dizziness   Pertinent History Patient arrives to physical therapy with a report of unsteadiness and disequilibrium in her left ear within the last two years. She reports that her dizziness has been worsening within the last two months following a fall in September. She reported descending stairs at night with her puppy in her arms and suddenly lost her balance. Pars fracture of L5. Pt reported significant bruise on her lower back. Seen in the emergency room. Within the last couple months she reports that the symptoms have affected her driving; a couple instances requiring her to pullover secondary to symptoms. She reports that she experiences impaired proprioception and frequently bumps her extremities into door ways. Patient reports frequent bouts of tripping or her foot catching throughout the day. Currently she denies fevers, chills, vomiting and saddle parasthesia.    Imaging (Per Chart Review):  CT of the head C-spine and L-spine reviewed. No acute fracture, retrolisthesis of L5 on S1.   Description of dizziness: Disequilibrium  Frequency: Constant Duration: Constant Symptom nature: constant and positional Progression of symptoms since onset: worse History of similar episodes: Yes  Provocative Factors: Bending Over, Looking Down Easing Factors: None reported  Auditory complaints (tinnitus, pain, drainage, hearing loss, aural fullness): Yes, "Muffled tones", "sounds like water" Vision changes (diplopia, visual field loss, recent changes, recent eye exam): Yes "Blurriness". Chest pain/palpitations: Yes History of head injury/concussion: No Stress/anxiety: Yes Migraines/headaches: Yes Nausea/vomiting: Yes Numbness/tingling: Yes Posterior Thigh  Focal weakness: No Dysarthria/dysphagia/drop attacks: No  Has patient fallen in last 6 months? Yes, Number of falls: 1 Pertinent pain: Yes, low back pain Dominant hand: right Imaging: Yes, see history; Prior level of function:  Independent Hobbies: Gardening, Staining, Puppies   Red Flags: Negative for bowel/bladder changes, personal history of cancer, chills/fever, night sweats, unexplained weight loss/gain  PRECAUTIONS: Fall  WEIGHT BEARING RESTRICTIONS No  LIVING ENVIRONMENT: Lives with: lives with their family Lives in: House/apartment Stairs: Yes; Internal: 14 steps; can reach both Has following equipment at home: None  PATIENT GOALS: " patient would like decrease symptoms"   OBJECTIVE EXAMINATION  POSTURE: No gross deficits contributing to symptoms  NEUROLOGICAL SCREEN: (2+ unless otherwise noted.) N=normal  Ab=abnormal  Level Dermatome R L Myotome R L Reflex R L  C3 Anterior Neck N N Sidebend C2-3 N N Jaw CN V    C4 Top of Shoulder N N Shoulder Shrug C4 N N Hoffman's UMN    C5 Lateral Upper Arm N N Shoulder ABD C4-5 N N Biceps C5-6    C6 Lateral Arm/ Thumb N N Arm Flex/ Wrist Ext C5-6 N N Brachiorad. C5-6    C7 Middle Finger N N Arm Ext//Wrist Flex C6-7 N N Triceps C7    C8 4th & 5th Finger N N Flex/ Ext Carpi Ulnaris C8 N N Patellar (L3-4)    T1 Medial Arm N N Interossei T1 N N Gastrocnemius    L2 Medial thigh/groin N N Illiopsoas (L2-3) N N     L3 Lower thigh/med.knee N N Quadriceps (L3-4) N N     L4 Medial leg/lat thigh N N Tibialis Ant (L4-5) N N     L5 Lat. leg & dorsal foot N N EHL (L5) N N     S1 post/lat foot/thigh/leg N N Gastrocnemius (S1-2) N N     S2 Post./med. thigh & leg N N Hamstrings (L4-S3) N N      CRANIAL NERVES Deferred  COORDINATION Deferred    RANGE OF MOTION Cervical Spine AROM WFL and painless in all planes. No functional focal deficits in AROM noted in BUE/BLE  MANUAL MUSCLE TESTING BUE strength WNL without focal deficits  TRANSFERS/GAIT Independent for transfers and ambulation without assistive device   PATIENT SURVEYS FOTO: To be completed ABC: To be completed DHI: To be completed  OCULOMOTOR / VESTIBULAR TESTING  Oculomotor Exam- Room Light   Findings Comments  Ocular Alignment normal   Ocular ROM normal   Spontaneous Nystagmus normal   Gaze-Holding Nystagmus normal   End-Gaze Nystagmus normal   Vergence (normal 2-3") not examined   Smooth Pursuit normal   Cross-Cover Test not examined   Saccades normal   VOR Cancellation normal   Left Head Impulse normal   Right Head Impulse abnormal  Corrective saccade noted  Static Acuity not examined   Dynamic Acuity not examined    Oculomotor Exam- Fixation Suppressed  Findings Comments  Ocular Alignment normal   Spontaneous Nystagmus normal   Gaze-Holding Nystagmus normal   End-Gaze Nystagmus normal   Head Shaking Nystagmus normal   Pressure-Induced Nystagmus normal   Hyperventilation Induced Nystagmus normal   Skull Vibration Induced Nystagmus normal Report of dizziness when applied to base of occiput   BPPV TESTS:  Symptoms Duration Intensity Nystagmus  L Dix-Hallpike Dizziness < 10s  Mild  None  R Dix-Hallpike None   None  L Head Roll None   None  R Head Roll None   None  L  Sidelying Test      R Sidelying Test      (blank = not tested)  Clinical Test of Sensory Interaction for Balance (CTSIB): Deferred  FUNCTIONAL OUTCOME MEASURES  Results Comments  BERG    DGI    FGA    TUG    5TSTS    6 Minute Walk Test    10 Meter Gait Speed    (blank = not tested)   TODAY'S TREATMENT   Subjective: Pt is dealing with a migraine upon arrival today. She reports possible slight improvement in symptoms since starting with therapy. No further questions or concerns    Pain: No resting pain or dizziness   Neuromuscular Re-education  Updated outcome measures with patient: FOTO: 39 ABC: 33.1% DHI: 82/100  Orthostatic vitals: Supine: BP: 153/66 mmHg, HR: 73 bpm, SpO2: 98% Seated: BP: 143/46 mmHg, HR: 78 bpm, SpO2: 100% Standing (1 minute): BP: machine error, HR: 94 bpm, SpO2: 99% Standing (3 minutes): BP: 133/77 mmHg, HR: 94 bpm, SpO2: 99%    PATIENT EDUCATION:   Education details: Plan of care Person educated: Patient Education method: Explanation Education comprehension: verbalized understanding   HOME EXERCISE PROGRAM:  Access Code: [email or web address] URL: [email or web address] Date: 09/30/2023 Prepared by: Ria Comment  Exercises - Seated Gaze Stabilization with Head Rotation  - 4 x daily - 7 x weekly - 3 reps - 60 seconds hold   ASSESSMENT: CLINICAL IMPRESSION: Updated outcome measures with patient during session today. She reports possible slight improvement in symptoms since starting with therapy. Her ABC and DHI improved slightly since the initial evaluation. Performed orthostatic vitals which are positive for drop from supine to sitting. Unfortunately due to time constraints unable to finish updating goals. Additional outcome measures will be updated at next appointment. Pt encouraged to continue her HEP and follow-up as scheduled. Based on today's findings pt will benefit from PT services to address deficits in dizziness and balance in order to return to full function at home.  OBJECTIVE IMPAIRMENTS: decreased activity tolerance, decreased balance, difficulty walking, decreased strength, dizziness, and impaired vision/preception.   ACTIVITY LIMITATIONS: carrying, bending, standing, squatting, and stairs  PARTICIPATION LIMITATIONS: driving, community activity, and yard work  PERSONAL FACTORS: Age, Fitness, Sex, Time since onset of injury/illness/exacerbation, and 1-2 comorbidities: Anxiety, Sjogren's, HTN, anemia  are also affecting patient's functional outcome.   REHAB POTENTIAL: Fair    CLINICAL DECISION MAKING: Unstable/unpredictable  EVALUATION COMPLEXITY: High   GOALS:  SHORT TERM GOALS: Target date: 10/20/2023   Pt will be independent with HEP for dizziness in order to decrease symptoms, improve balance,decrease fall risk, and improve function at home. Baseline: Goal status: INITIAL   LONG TERM GOALS:  Target date: 11/17/2023  Pt will increase FOTO to at least 56  to demonstrate significant improvement in function at home related to dizziness.  Baseline: 10/13/2023: 48, 11/17/23: 39 Goal status: IN PROGRESS  2.  Pt will decrease DHI score by at least 18 points in order to demonstrate clinically significant reduction in disability related to dizziness.  Baseline: 10/25/2023: 84/100; 11/17/23: 82/100; Goal status: PARTIALLY MET  3.  Pt will improve ABC by at least 13% in order to demonstrate clinically significant improvement in balance confidence.      Baseline: 09/30/23: 31.8%; 11/17/23: 33.1% Goal status: PARTIALLY MET  4. Pt will decrease 5TSTS by at least 3 seconds in order to demonstrate clinically significant improvement in LE strength      Baseline: 10/13/2023: 8.40s Goal status: Discontinued  5. Pt  will improve FGA score by at least 4 points in order to demonstrate clinically significant improvement in balance and decreased risk for falls.     Baseline: To be completed Goal status: INITIAL  PLAN: PT FREQUENCY: 1x/week  PT DURATION: 8 weeks  PLANNED INTERVENTIONS: Therapeutic exercises, Therapeutic activity, Neuromuscular re-education, Balance training, Gait training, Patient/Family education, Self Care, Joint mobilization, Joint manipulation, Vestibular training, Canalith repositioning, Orthotic/Fit training, DME instructions, Dry Needling, Electrical stimulation, Spinal manipulation, Spinal mobilization, Cryotherapy, Moist heat, Taping, Traction, Ultrasound, Ionotophoresis 4mg /ml Dexamethasone, Manual therapy, and Re-evaluation.  PLAN FOR NEXT SESSION: Progress habituation exercises, fixation testing (skull vibration induced)    Sharalyn Ink Ariatna Jester PT, DPT, GCS  11/19/2023 7:09 PM

## 2023-11-17 ENCOUNTER — Ambulatory Visit: Payer: Federal, State, Local not specified - PPO | Attending: Internal Medicine

## 2023-11-17 DIAGNOSIS — R42 Dizziness and giddiness: Secondary | ICD-10-CM | POA: Diagnosis present

## 2023-11-29 ENCOUNTER — Ambulatory Visit: Payer: Federal, State, Local not specified - PPO

## 2023-11-29 DIAGNOSIS — R42 Dizziness and giddiness: Secondary | ICD-10-CM

## 2023-12-01 NOTE — Therapy (Signed)
OUTPATIENT PHYSICAL THERAPY VESTIBULAR TREATMENT  Patient Name: Brenda Ross MRN: [id number] DOB:02/24/1976, 47 y.o., female Today's Date: 12/02/2023  END OF SESSION:  PT End of Session - 12/02/23 1020     Visit Number 5    Number of Visits 9    Date for PT Re-Evaluation 01/12/24    Authorization Type Eval: 09/22/2023, recert: 11/17/23    PT Start Time 1020    PT Stop Time 1101    PT Time Calculation (min) 41 min    Activity Tolerance Patient tolerated treatment well    Behavior During Therapy WFL for tasks assessed/performed            Past Medical History:  Diagnosis Date   Allergic rhinitis    Anemia    Anxiety    GERD (gastroesophageal reflux disease)    Herpes genitalia    Prediabetes    Sjogren's disease (HCC)    Sleep apnea    Past Surgical History:  Procedure Laterality Date   APPENDECTOMY     Patient Active Problem List   Diagnosis Date Noted   Vitamin D deficiency 02/14/2023   Sjogren's syndrome (HCC) 02/14/2023   Chronic venous insufficiency 02/23/2021   Adult celiac disease 08/11/2020   Benign essential hypertension 08/11/2020   Adjustment disorder with anxious mood 08/08/2020   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   Memory difficulties 08/08/2020   BMI 45.0-49.9, adult (HCC) 07/25/2020   Genital herpes simplex 10/17/2018   Palpitations 09/01/2018   GERD (gastroesophageal reflux disease)    Prediabetes    Sleep apnea    Anemia    Allergic rhinitis    Family history of catecholaminergic polymorphic ventricular tachycardia (CPVT) 07/08/2016   IGT (impaired glucose tolerance) 07/08/2016   PCP: Mort Sawyers, FNP  REFERRING PROVIDER: Enid Baas, MD   REFERRING DIAG: R42 (ICD-10-CM) - Dizziness and giddiness   RATIONALE FOR EVALUATION AND TREATMENT: Rehabilitation  THERAPY DIAG: Dizziness and giddiness  ONSET DATE: 08/22/2021  FOLLOW-UP APPT SCHEDULED WITH REFERRING PROVIDER: Yes   SUBJECTIVE:   Chief Complaint:  Dizziness    Pertinent History Patient arrives to physical therapy with a report of unsteadiness and disequilibrium in her left ear within the last two years. She reports that her dizziness has been worsening within the last two months following a fall in September. She reported descending stairs at night with her puppy in her arms and suddenly lost her balance. Pars fracture of L5. Pt reported significant bruise on her lower back. Seen in the emergency room. Within the last couple months she reports that the symptoms have affected her driving; a couple instances requiring her to pullover secondary to symptoms. She reports that she experiences impaired proprioception and frequently bumps her extremities into door ways. Patient reports frequent bouts of tripping or her foot catching throughout the day. Currently she denies fevers, chills, vomiting and saddle parasthesia.    Imaging (Per Chart Review):  CT of the head C-spine and L-spine reviewed. No acute fracture, retrolisthesis of L5 on S1.   Description of dizziness: Disequilibrium  Frequency: Constant Duration: Constant Symptom nature: constant and positional Progression of symptoms since onset: worse History of similar episodes: Yes  Provocative Factors: Bending Over, Looking Down Easing Factors: None reported  Auditory complaints (tinnitus, pain, drainage, hearing loss, aural fullness): Yes, "Muffled tones", "sounds like water" Vision changes (diplopia, visual field loss, recent changes, recent eye exam): Yes "Blurriness". Chest pain/palpitations: Yes History of head injury/concussion: No Stress/anxiety: Yes Migraines/headaches: Yes Nausea/vomiting: Yes Numbness/tingling: Yes Posterior Thigh  Focal weakness: No Dysarthria/dysphagia/drop attacks: No  Has patient fallen in last 6 months? Yes, Number of falls: 1 Pertinent pain: Yes, low back pain Dominant hand: right Imaging: Yes, see history; Prior level of function: Independent Hobbies:  Gardening, Staining, Puppies   Red Flags: Negative for bowel/bladder changes, personal history of cancer, chills/fever, night sweats, unexplained weight loss/gain  PRECAUTIONS: Fall  WEIGHT BEARING RESTRICTIONS No  LIVING ENVIRONMENT: Lives with: lives with their family Lives in: House/apartment Stairs: Yes; Internal: 14 steps; can reach both Has following equipment at home: None  PATIENT GOALS: " patient would like decrease symptoms"   OBJECTIVE EXAMINATION  POSTURE: No gross deficits contributing to symptoms  NEUROLOGICAL SCREEN: (2+ unless otherwise noted.) N=normal  Ab=abnormal  Level Dermatome R L Myotome R L Reflex R L  C3 Anterior Neck N N Sidebend C2-3 N N Jaw CN V    C4 Top of Shoulder N N Shoulder Shrug C4 N N Hoffman's UMN    C5 Lateral Upper Arm N N Shoulder ABD C4-5 N N Biceps C5-6    C6 Lateral Arm/ Thumb N N Arm Flex/ Wrist Ext C5-6 N N Brachiorad. C5-6    C7 Middle Finger N N Arm Ext//Wrist Flex C6-7 N N Triceps C7    C8 4th & 5th Finger N N Flex/ Ext Carpi Ulnaris C8 N N Patellar (L3-4)    T1 Medial Arm N N Interossei T1 N N Gastrocnemius    L2 Medial thigh/groin N N Illiopsoas (L2-3) N N     L3 Lower thigh/med.knee N N Quadriceps (L3-4) N N     L4 Medial leg/lat thigh N N Tibialis Ant (L4-5) N N     L5 Lat. leg & dorsal foot N N EHL (L5) N N     S1 post/lat foot/thigh/leg N N Gastrocnemius (S1-2) N N     S2 Post./med. thigh & leg N N Hamstrings (L4-S3) N N      CRANIAL NERVES Deferred  COORDINATION Deferred    RANGE OF MOTION Cervical Spine AROM WFL and painless in all planes. No functional focal deficits in AROM noted in BUE/BLE  MANUAL MUSCLE TESTING BUE strength WNL without focal deficits  TRANSFERS/GAIT Independent for transfers and ambulation without assistive device   PATIENT SURVEYS FOTO: To be completed ABC: To be completed DHI: To be completed  OCULOMOTOR / VESTIBULAR TESTING  Oculomotor Exam- Room Light  Findings Comments   Ocular Alignment normal   Ocular ROM normal   Spontaneous Nystagmus normal   Gaze-Holding Nystagmus normal   End-Gaze Nystagmus normal   Vergence (normal 2-3") not examined   Smooth Pursuit normal   Cross-Cover Test not examined   Saccades normal   VOR Cancellation normal   Left Head Impulse normal   Right Head Impulse abnormal  Corrective saccade noted  Static Acuity not examined   Dynamic Acuity not examined    Oculomotor Exam- Fixation Suppressed  Findings Comments  Ocular Alignment normal   Spontaneous Nystagmus normal   Gaze-Holding Nystagmus normal   End-Gaze Nystagmus normal   Head Shaking Nystagmus normal   Pressure-Induced Nystagmus normal   Hyperventilation Induced Nystagmus normal   Skull Vibration Induced Nystagmus normal Report of dizziness when applied to base of occiput   BPPV TESTS:  Symptoms Duration Intensity Nystagmus  L Dix-Hallpike Dizziness < 10s  Mild  None  R Dix-Hallpike None   None  L Head Roll None   None  R Head Roll None   None  L  Sidelying Test      R Sidelying Test      (blank = not tested)  Clinical Test of Sensory Interaction for Balance (CTSIB): Deferred  FUNCTIONAL OUTCOME MEASURES  Results Comments  BERG    DGI    FGA    TUG    5TSTS    6 Minute Walk Test    10 Meter Gait Speed    (blank = not tested)  Orthostatic vitals: Supine: BP: 153/66 mmHg, HR: 73 bpm, SpO2: 98% Seated: BP: 143/46 mmHg, HR: 78 bpm, SpO2: 100% Standing (1 minute): BP: machine error, HR: 94 bpm, SpO2: 99% Standing (3 minutes): BP: 133/77 mmHg, HR: 94 bpm, SpO2: 99%    TODAY'S TREATMENT   Subjective: Pt reports no significant changes since the last therapy session with respect to her dizziness. Still dealing with occasional migraines. No further questions or concerns    Pain: No resting pain or dizziness   Neuromuscular Re-education  Dynamic Visual Acuity Test: Static: 20/15 (line 9) Dynamic: 20/25 (line 7)  BPPV TESTS:  Symptoms Duration  Intensity Nystagmus  L Dix-Hallpike Dizziness    None  R Dix-Hallpike Dizziness   None  L Head Roll Dizziness   None  R Head Roll Dizziness   None  L Sidelying Test      R Sidelying Test      (blank = not tested)   VOR x 1 horizontal in sitting 3 x 60s (No dizziness); Forward gait in hallway with vertical ball lifts with head/eye follow 2 x 70'; Forward gait in hallway with horizontal ball passes between hands with head/eye follow 2 x 70'; Forward gait in hallway with horizontal ball passes to therapist with head/eye follow 2 x 70' each toward both sides; Tandem balance alternating forward LE eyes open/closed x 30s each; Tandem balance alternating forward LE with horizontal head turns x 30s with each foot forward; HEP updated and reviewed with patient;   Manual Therapy  STM to bilateral SCM with reproduction of aural fullness and roaring sensation bilaterally x 5 minutes;    PATIENT EDUCATION:  Education details: Plan of care, habituation and gaze stabilization exercises, HEP Person educated: Patient Education method: Explanation, handout, demonstration, verbal cues and tactile cues Education comprehension: verbalized understanding and returned demonstration   HOME EXERCISE PROGRAM:  Access Code: [email or web address] URL: [email or web address] Date: 12/02/2023 Prepared by: Ria Comment  Exercises - Seated Gaze Stabilization with Head Rotation  - 4 x daily - 7 x weekly - 3 reps - 60 seconds hold - Tandem Stance with Eyes Closed  - 1-2 x daily - 7 x weekly - 3 sets - 30s hold - Tandem Stance with Eyes Closed (Mirrored)  - 1-2 x daily - 7 x weekly - 3 sets - 30s hold - Tandem Stance with Head Rotation  - 1-2 x daily - 7 x weekly - 3 sets - 30s hold - Tandem Stance with Head Rotation (Mirrored)  - 1-2 x daily - 7 x weekly - 3 sets - 30s hold   ASSESSMENT: CLINICAL IMPRESSION: Repeated BPPV testing today which remains negative in all positions for both vertigo and  nystagmus.  Dynamic visual acuity test is also negative with only 2 line loss.  Progressed gaze stabilization and habituation exercises during session.  Updated HEP.  Patient encouraged to follow-up as scheduled. Based on today's findings pt will benefit from PT services to address deficits in dizziness and balance in order to return to full function at home.  OBJECTIVE IMPAIRMENTS:  decreased activity tolerance, decreased balance, difficulty walking, decreased strength, dizziness, and impaired vision/preception.   ACTIVITY LIMITATIONS: carrying, bending, standing, squatting, and stairs  PARTICIPATION LIMITATIONS: driving, community activity, and yard work  PERSONAL FACTORS: Age, Fitness, Sex, Time since onset of injury/illness/exacerbation, and 1-2 comorbidities: Anxiety, Sjogren's, HTN, anemia  are also affecting patient's functional outcome.   REHAB POTENTIAL: Fair    CLINICAL DECISION MAKING: Unstable/unpredictable  EVALUATION COMPLEXITY: High   GOALS:  SHORT TERM GOALS: Target date: 10/20/2023   Pt will be independent with HEP for dizziness in order to decrease symptoms, improve balance,decrease fall risk, and improve function at home. Baseline: Goal status: INITIAL   LONG TERM GOALS: Target date: 11/17/2023  Pt will increase FOTO to at least 56  to demonstrate significant improvement in function at home related to dizziness.  Baseline: 10/13/2023: 48, 11/17/23: 39 Goal status: IN PROGRESS  2.  Pt will decrease DHI score by at least 18 points in order to demonstrate clinically significant reduction in disability related to dizziness.  Baseline: 10/25/2023: 84/100; 11/17/23: 82/100; Goal status: PARTIALLY MET  3.  Pt will improve ABC by at least 13% in order to demonstrate clinically significant improvement in balance confidence.      Baseline: 09/30/23: 31.8%; 11/17/23: 33.1% Goal status: PARTIALLY MET  4. Pt will decrease 5TSTS by at least 3 seconds in order to demonstrate  clinically significant improvement in LE strength      Baseline: 10/13/2023: 8.40s Goal status: Discontinued  5. Pt will improve FGA score by at least 4 points in order to demonstrate clinically significant improvement in balance and decreased risk for falls.     Baseline: To be completed Goal status: INITIAL  PLAN: PT FREQUENCY: 1x/week  PT DURATION: 8 weeks  PLANNED INTERVENTIONS: Therapeutic exercises, Therapeutic activity, Neuromuscular re-education, Balance training, Gait training, Patient/Family education, Self Care, Joint mobilization, Joint manipulation, Vestibular training, Canalith repositioning, Orthotic/Fit training, DME instructions, Dry Needling, Electrical stimulation, Spinal manipulation, Spinal mobilization, Cryotherapy, Moist heat, Taping, Traction, Ultrasound, Ionotophoresis 4mg /ml Dexamethasone, Manual therapy, and Re-evaluation.  PLAN FOR NEXT SESSION: Progress gaze stabilization and habituation exercises   Lynnea Maizes PT, DPT, GCS  12/02/2023 11:37 AM

## 2023-12-02 ENCOUNTER — Ambulatory Visit: Payer: Federal, State, Local not specified - PPO

## 2023-12-02 DIAGNOSIS — R42 Dizziness and giddiness: Secondary | ICD-10-CM | POA: Diagnosis not present

## 2023-12-09 ENCOUNTER — Ambulatory Visit: Payer: Federal, State, Local not specified - PPO

## 2023-12-09 DIAGNOSIS — R42 Dizziness and giddiness: Secondary | ICD-10-CM

## 2023-12-09 NOTE — Therapy (Signed)
 OUTPATIENT PHYSICAL THERAPY VESTIBULAR TREATMENT  Patient Name: Brenda Ross MRN: [id number] DOB:02/25/1976, 47 y.o., female Today's Date: 12/13/2023  END OF SESSION:  PT End of Session - 12/13/23 1715     Visit Number 6    Number of Visits 9    Date for PT Re-Evaluation 01/12/24    Authorization Type Eval: 09/22/2023, recert: 11/17/23    PT Start Time 1715    PT Stop Time 1745    PT Time Calculation (min) 30 min    Activity Tolerance Patient tolerated treatment well    Behavior During Therapy WFL for tasks assessed/performed            Past Medical History:  Diagnosis Date   Allergic rhinitis    Anemia    Anxiety    GERD (gastroesophageal reflux disease)    Herpes genitalia    Prediabetes    Sjogren's disease (HCC)    Sleep apnea    Past Surgical History:  Procedure Laterality Date   APPENDECTOMY     Patient Active Problem List   Diagnosis Date Noted   Vitamin D  deficiency 02/14/2023   Sjogren's syndrome (HCC) 02/14/2023   Chronic venous insufficiency 02/23/2021   Adult celiac disease 08/11/2020   Benign essential hypertension 08/11/2020   Adjustment disorder with anxious mood 08/08/2020   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   Memory difficulties 08/08/2020   BMI 45.0-49.9, adult (HCC) 07/25/2020   Genital herpes simplex 10/17/2018   Palpitations 09/01/2018   GERD (gastroesophageal reflux disease)    Prediabetes    Sleep apnea    Anemia    Allergic rhinitis    Family history of catecholaminergic polymorphic ventricular tachycardia (CPVT) 07/08/2016   IGT (impaired glucose tolerance) 07/08/2016   PCP: Corwin Antu, FNP  REFERRING PROVIDER: Sherial Bail, MD   REFERRING DIAG: R42 (ICD-10-CM) - Dizziness and giddiness   RATIONALE FOR EVALUATION AND TREATMENT: Rehabilitation  THERAPY DIAG: Dizziness and giddiness  ONSET DATE: 08/22/2021  FOLLOW-UP APPT SCHEDULED WITH REFERRING PROVIDER: Yes   SUBJECTIVE:   Chief Complaint:  Dizziness    Pertinent History Patient arrives to physical therapy with a report of unsteadiness and disequilibrium in her left ear within the last two years. She reports that her dizziness has been worsening within the last two months following a fall in September. She reported descending stairs at night with her puppy in her arms and suddenly lost her balance. Pars fracture of L5. Pt reported significant bruise on her lower back. Seen in the emergency room. Within the last couple months she reports that the symptoms have affected her driving; a couple instances requiring her to pullover secondary to symptoms. She reports that she experiences impaired proprioception and frequently bumps her extremities into door ways. Patient reports frequent bouts of tripping or her foot catching throughout the day. Currently she denies fevers, chills, vomiting and saddle parasthesia.    Imaging (Per Chart Review):  CT of the head C-spine and L-spine reviewed. No acute fracture, retrolisthesis of L5 on S1.   Description of dizziness: Disequilibrium  Frequency: Constant Duration: Constant Symptom nature: constant and positional Progression of symptoms since onset: worse History of similar episodes: Yes  Provocative Factors: Bending Over, Looking Down Easing Factors: None reported  Auditory complaints (tinnitus, pain, drainage, hearing loss, aural fullness): Yes, Muffled tones, sounds like water Vision changes (diplopia, visual field loss, recent changes, recent eye exam): Yes Blurriness. Chest pain/palpitations: Yes History of head injury/concussion: No Stress/anxiety: Yes Migraines/headaches: Yes Nausea/vomiting: Yes Numbness/tingling: Yes Posterior Thigh  Focal weakness: No Dysarthria/dysphagia/drop attacks: No  Has patient fallen in last 6 months? Yes, Number of falls: 1 Pertinent pain: Yes, low back pain Dominant hand: right Imaging: Yes, see history; Prior level of function: Independent Hobbies:  Gardening, Staining, Puppies   Red Flags: Negative for bowel/bladder changes, personal history of cancer, chills/fever, night sweats, unexplained weight loss/gain  PRECAUTIONS: Fall  WEIGHT BEARING RESTRICTIONS No  LIVING ENVIRONMENT: Lives with: lives with their family Lives in: House/apartment Stairs: Yes; Internal: 14 steps; can reach both Has following equipment at home: None  PATIENT GOALS:  patient would like decrease symptoms   OBJECTIVE EXAMINATION  POSTURE: No gross deficits contributing to symptoms  NEUROLOGICAL SCREEN: (2+ unless otherwise noted.) N=normal  Ab=abnormal  Level Dermatome R L Myotome R L Reflex R L  C3 Anterior Neck N N Sidebend C2-3 N N Jaw CN V    C4 Top of Shoulder N N Shoulder Shrug C4 N N Hoffman's UMN    C5 Lateral Upper Arm N N Shoulder ABD C4-5 N N Biceps C5-6    C6 Lateral Arm/ Thumb N N Arm Flex/ Wrist Ext C5-6 N N Brachiorad. C5-6    C7 Middle Finger N N Arm Ext//Wrist Flex C6-7 N N Triceps C7    C8 4th & 5th Finger N N Flex/ Ext Carpi Ulnaris C8 N N Patellar (L3-4)    T1 Medial Arm N N Interossei T1 N N Gastrocnemius    L2 Medial thigh/groin N N Illiopsoas (L2-3) N N     L3 Lower thigh/med.knee N N Quadriceps (L3-4) N N     L4 Medial leg/lat thigh N N Tibialis Ant (L4-5) N N     L5 Lat. leg & dorsal foot N N EHL (L5) N N     S1 post/lat foot/thigh/leg N N Gastrocnemius (S1-2) N N     S2 Post./med. thigh & leg N N Hamstrings (L4-S3) N N      CRANIAL NERVES Deferred  COORDINATION Deferred    RANGE OF MOTION Cervical Spine AROM WFL and painless in all planes. No functional focal deficits in AROM noted in BUE/BLE  MANUAL MUSCLE TESTING BUE strength WNL without focal deficits  TRANSFERS/GAIT Independent for transfers and ambulation without assistive device   PATIENT SURVEYS FOTO: To be completed ABC: To be completed DHI: To be completed  OCULOMOTOR / VESTIBULAR TESTING  Oculomotor Exam- Room Light  Findings Comments   Ocular Alignment normal   Ocular ROM normal   Spontaneous Nystagmus normal   Gaze-Holding Nystagmus normal   End-Gaze Nystagmus normal   Vergence (normal 2-3) not examined   Smooth Pursuit normal   Cross-Cover Test not examined   Saccades normal   VOR Cancellation normal   Left Head Impulse normal   Right Head Impulse abnormal  Corrective saccade noted  Static Acuity 12/02/23: 20/15 (line 9) WNL  Dynamic Acuity 12/02/23: 20/25 (line 7) WNL   Oculomotor Exam- Fixation Suppressed  Findings Comments  Ocular Alignment normal   Spontaneous Nystagmus normal   Gaze-Holding Nystagmus normal   End-Gaze Nystagmus normal   Head Shaking Nystagmus normal   Pressure-Induced Nystagmus normal   Hyperventilation Induced Nystagmus normal   Skull Vibration Induced Nystagmus normal Report of dizziness when applied to base of occiput   BPPV TESTS:  Symptoms Duration Intensity Nystagmus  L Dix-Hallpike Dizziness < 10s  Mild  None  R Dix-Hallpike None   None  L Head Roll None   None  R Head Roll None  None  L Sidelying Test      R Sidelying Test      (blank = not tested)    FUNCTIONAL OUTCOME MEASURES  Results Comments  BERG    DGI    FGA    TUG    5TSTS    6 Minute Walk Test    10 Meter Gait Speed    (blank = not tested)  Orthostatic vitals: Supine: BP: 153/66 mmHg, HR: 73 bpm, SpO2: 98% Seated: BP: 143/46 mmHg, HR: 78 bpm, SpO2: 100% Standing (1 minute): BP: machine error, HR: 94 bpm, SpO2: 99% Standing (3 minutes): BP: 133/77 mmHg, HR: 94 bpm, SpO2: 99%    TODAY'S TREATMENT    Subjective: Pt reports some slight improvement in her dizziness symptoms recently. Otherwise no further questions or concerns.    Pain: No resting pain or dizziness   Neuromuscular Re-education  VOR x 1 horizontal in standing 2 x 60s (no dizziness); VOR x 1 vertical in standing x 60s (no dizziness);  Modified Clinical Test of Sensory Interaction for Balance (CTSIB): 30s in all 4  conditions; BERG: 56/56; FGA: 29/30;  Forward/retro gait in hallway with vertical ball lifts with head/eye follow x 70' each; Forward/retro gait in hallway with horizontal ball passes between hands with head/eye follow x 70' each; Forward/retro gait in hallway with horizontal ball passes to therapist with head/eye follow x 70' each toward both sides;   PATIENT EDUCATION:  Education details: Outcome measures, plan of care, habituation and gaze stabilization exercises Person educated: Patient Education method: Explanation, handout, demonstration, verbal cues and tactile cues Education comprehension: verbalized understanding and returned demonstration   HOME EXERCISE PROGRAM:  Access Code: [email or web address] URL: [email or web address] Date: 12/02/2023 Prepared by: Selinda Eck  Exercises - Seated Gaze Stabilization with Head Rotation  - 4 x daily - 7 x weekly - 3 reps - 60 seconds hold - Tandem Stance with Eyes Closed  - 1-2 x daily - 7 x weekly - 3 sets - 30s hold - Tandem Stance with Eyes Closed (Mirrored)  - 1-2 x daily - 7 x weekly - 3 sets - 30s hold - Tandem Stance with Head Rotation  - 1-2 x daily - 7 x weekly - 3 sets - 30s hold - Tandem Stance with Head Rotation (Mirrored)  - 1-2 x daily - 7 x weekly - 3 sets - 30s hold   ASSESSMENT: CLINICAL IMPRESSION: Performed additional balance testing with pt during session today. She demonstrates excellent balance scoring 56/56 on the BERG and 29/30 on the FGA. Progressed gaze stabilization and habituation exercises during session without increase in dizziness. Patient encouraged to follow-up as scheduled. Based on today's findings she will benefit from PT services to address deficits in dizziness and balance in order to return to full function at home.  OBJECTIVE IMPAIRMENTS: decreased activity tolerance, decreased balance, difficulty walking, decreased strength, dizziness, and impaired vision/preception.   ACTIVITY LIMITATIONS:  carrying, bending, standing, squatting, and stairs  PARTICIPATION LIMITATIONS: driving, community activity, and yard work  PERSONAL FACTORS: Age, Fitness, Sex, Time since onset of injury/illness/exacerbation, and 1-2 comorbidities: Anxiety, Sjogren's, HTN, anemia  are also affecting patient's functional outcome.   REHAB POTENTIAL: Fair    CLINICAL DECISION MAKING: Unstable/unpredictable  EVALUATION COMPLEXITY: High   GOALS:  SHORT TERM GOALS: Target date: 10/20/2023   Pt will be independent with HEP for dizziness in order to decrease symptoms, improve balance,decrease fall risk, and improve function at home. Baseline: Goal status: INITIAL   LONG TERM GOALS:  Target date: 01/11/2023  Pt will increase FOTO to at least 56  to demonstrate significant improvement in function at home related to dizziness.  Baseline: 10/13/2023: 48, 11/17/23: 39 Goal status: IN PROGRESS  2.  Pt will decrease DHI score by at least 18 points in order to demonstrate clinically significant reduction in disability related to dizziness.  Baseline: 10/25/2023: 84/100; 11/17/23: 82/100; Goal status: PARTIALLY MET  3.  Pt will improve ABC by at least 13% in order to demonstrate clinically significant improvement in balance confidence.      Baseline: 09/30/23: 31.8%; 11/17/23: 33.1% Goal status: PARTIALLY MET  4. Pt will decrease 5TSTS by at least 3 seconds in order to demonstrate clinically significant improvement in LE strength      Baseline: 10/13/2023: 8.40s Goal status: Discontinued  5. Pt will improve FGA score by at least 4 points in order to demonstrate clinically significant improvement in balance and decreased risk for falls.     Baseline: To be completed Goal status: INITIAL  PLAN: PT FREQUENCY: 1x/week  PT DURATION: 8 weeks  PLANNED INTERVENTIONS: Therapeutic exercises, Therapeutic activity, Neuromuscular re-education, Balance training, Gait training, Patient/Family education, Self Care, Joint  mobilization, Joint manipulation, Vestibular training, Canalith repositioning, Orthotic/Fit training, DME instructions, Dry Needling, Electrical stimulation, Spinal manipulation, Spinal mobilization, Cryotherapy, Moist heat, Taping, Traction, Ultrasound, Ionotophoresis 4mg /ml Dexamethasone, Manual therapy, and Re-evaluation.  PLAN FOR NEXT SESSION: Progress gaze stabilization and habituation exercises   Selinda JONETTA Eck PT, DPT, GCS  12/13/2023 6:26 PM

## 2023-12-13 ENCOUNTER — Ambulatory Visit: Payer: Federal, State, Local not specified - PPO

## 2023-12-13 DIAGNOSIS — R42 Dizziness and giddiness: Secondary | ICD-10-CM

## 2024-03-22 ENCOUNTER — Encounter: Payer: Self-pay | Admitting: Radiology

## 2024-03-22 ENCOUNTER — Ambulatory Visit
Admission: RE | Admit: 2024-03-22 | Discharge: 2024-03-22 | Disposition: A | Discharge status: Home / Self Care | Source: Ambulatory Visit | Attending: Internal Medicine | Admitting: Internal Medicine

## 2024-03-22 DIAGNOSIS — Z1231 Encounter for screening mammogram for malignant neoplasm of breast: Secondary | ICD-10-CM | POA: Insufficient documentation

## 2024-05-03 ENCOUNTER — Ambulatory Visit: Admitting: Urology

## 2024-05-15 ENCOUNTER — Ambulatory Visit: Admitting: Urology

## 2024-05-29 ENCOUNTER — Encounter: Payer: Self-pay | Admitting: Urology

## 2024-05-29 ENCOUNTER — Ambulatory Visit: Admitting: Urology

## 2024-06-21 ENCOUNTER — Other Ambulatory Visit: Payer: Self-pay | Admitting: Internal Medicine

## 2024-06-21 DIAGNOSIS — R109 Unspecified abdominal pain: Secondary | ICD-10-CM

## 2024-06-21 DIAGNOSIS — R1030 Lower abdominal pain, unspecified: Secondary | ICD-10-CM

## 2024-07-02 ENCOUNTER — Ambulatory Visit
Admission: RE | Admit: 2024-07-02 | Discharge: 2024-07-02 | Disposition: A | Discharge status: Home / Self Care | Source: Ambulatory Visit | Attending: Internal Medicine | Admitting: Internal Medicine

## 2024-07-02 DIAGNOSIS — R109 Unspecified abdominal pain: Secondary | ICD-10-CM | POA: Diagnosis present

## 2024-07-02 DIAGNOSIS — R1030 Lower abdominal pain, unspecified: Secondary | ICD-10-CM | POA: Insufficient documentation

## 2024-07-09 ENCOUNTER — Other Ambulatory Visit: Payer: Self-pay | Admitting: Internal Medicine

## 2024-07-09 DIAGNOSIS — R1084 Generalized abdominal pain: Secondary | ICD-10-CM

## 2024-07-10 ENCOUNTER — Ambulatory Visit

## 2024-07-12 ENCOUNTER — Ambulatory Visit
Admission: RE | Admit: 2024-07-12 | Discharge: 2024-07-12 | Disposition: A | Discharge status: Home / Self Care | Source: Ambulatory Visit | Attending: Internal Medicine | Admitting: Internal Medicine

## 2024-07-12 DIAGNOSIS — R1084 Generalized abdominal pain: Secondary | ICD-10-CM | POA: Insufficient documentation

## 2024-07-19 ENCOUNTER — Other Ambulatory Visit: Payer: Self-pay | Admitting: Internal Medicine

## 2024-07-19 DIAGNOSIS — R1011 Right upper quadrant pain: Secondary | ICD-10-CM

## 2024-07-30 ENCOUNTER — Other Ambulatory Visit: Payer: Self-pay | Admitting: Podiatry

## 2024-08-08 ENCOUNTER — Encounter
Admission: RE | Admit: 2024-08-08 | Discharge: 2024-08-08 | Disposition: A | Discharge status: Home / Self Care | Source: Ambulatory Visit | Attending: Internal Medicine | Admitting: Internal Medicine

## 2024-08-08 DIAGNOSIS — R1011 Right upper quadrant pain: Secondary | ICD-10-CM | POA: Insufficient documentation

## 2024-08-08 MED ORDER — TECHNETIUM TC 99M EXAMETAZIME IV KIT
5.1300 | PACK | Freq: Once | INTRAVENOUS | Status: AC | PRN
  Administered 2024-08-08: 5.13 via INTRAVENOUS

## 2024-08-20 ENCOUNTER — Ambulatory Visit: Admitting: Urology

## 2024-08-20 VITALS — BP 126/71 | HR 84

## 2024-08-20 DIAGNOSIS — N3946 Mixed incontinence: Secondary | ICD-10-CM

## 2024-08-20 DIAGNOSIS — R350 Frequency of micturition: Secondary | ICD-10-CM

## 2024-08-20 DIAGNOSIS — R3915 Urgency of urination: Secondary | ICD-10-CM

## 2024-08-20 DIAGNOSIS — N390 Urinary tract infection, site not specified: Secondary | ICD-10-CM | POA: Diagnosis not present

## 2024-08-20 LAB — URINALYSIS, COMPLETE
Bilirubin, UA: NEGATIVE
Glucose, UA: NEGATIVE
Ketones, UA: NEGATIVE
Leukocytes,UA: NEGATIVE
Nitrite, UA: NEGATIVE
Protein,UA: NEGATIVE
RBC, UA: NEGATIVE
Specific Gravity, UA: 1.025 (ref 1.005–1.030)
Urobilinogen, Ur: 0.2 mg/dL (ref 0.2–1.0)
pH, UA: 6 (ref 5.0–7.5)

## 2024-08-20 LAB — MICROSCOPIC EXAMINATION: Epithelial Cells (non renal): >10 /HPF — AB (ref 0–10)

## 2024-08-20 MED ORDER — GEMTESA 75 MG PO TABS: 75.0000 mg | ORAL_TABLET | Freq: Every day | ORAL | 11 refills | Status: DC

## 2024-08-20 MED ORDER — GEMTESA 75 MG PO TABS: 75.0000 mg | ORAL_TABLET | Freq: Every day | ORAL | Status: DC

## 2024-08-20 NOTE — Progress Notes (Signed)
 08/20/2024 9:54 AM   Brenda Ross 1976-03-08 [id number]  Referring provider: Sherial Bail, MD [address] [place],  [place] [address]  Chief Complaint  Patient presents with   Establish Care   Urinary Frequency   Urinary Incontinence    HPI: I was consulted to assess patient's urinary incontinence.  In the last year she has urge incontinence especially foot on the floor syndrome for Singh in the morning and when she goes from sitting to standing position.  She does not wear a pad but has to change her close.  Sometimes leaks with coughing sneezing bending and lifting.  Voids every 2-3 hours and has no nocturia  Flow is good.  Use a cane for autoimmune Sjogren syndrome and fibromyalgia.  Has some knee issues.  No hysterectomy  Renal ultrasound and CT stone protocol in 2025 normal  No history of kidney stones bladder surgery or recurrent bladder infections.   No neurologic issues   PMH: Past Medical History:  Diagnosis Date   Allergic rhinitis    Anemia    Anxiety    GERD (gastroesophageal reflux disease)    Herpes genitalia    Prediabetes    Sjogren's disease (HCC)    Sleep apnea     Surgical History: Past Surgical History:  Procedure Laterality Date   APPENDECTOMY      Home Medications:  Allergies as of 08/20/2024       Reactions   Gadolinium Derivatives Anaphylaxis   Iodinated Contrast Media Hives, Shortness Of Breath, Swelling, Anaphylaxis   Iodine Anaphylaxis, Hives, Shortness Of Breath, Swelling   Other Hives   Pea causes diarrhea  Apples cause hives   Citalopram  Other (See Comments)   Nausea, myalgias   Corn-containing Products    Other reaction(s): Other (See Comments) Is sensitive to this   Egg-derived Products Diarrhea   Gluten Meal Diarrhea   Celiac's disease   Molds & Smuts Hives   Pea Diarrhea   Apple Hives        Medication List        Accurate as of August 20, 2024  9:54 AM. If you have any questions, ask  your nurse or doctor.          buPROPion 100 MG tablet Commonly known as: WELLBUTRIN Take 100 mg by mouth 1 day or 1 dose.   clotrimazole -betamethasone  cream Commonly known as: Lotrisone  Apply 1 Application topically 2 (two) times daily.   diltiazem 180 MG 24 hr capsule Commonly known as: CARDIZEM CD Take 180 mg by mouth daily.   EPINEPHrine  0.3 mg/0.3 mL Soaj injection Commonly known as: EPI-PEN Inject 0.3 mLs (0.3 mg total) into the muscle as needed for anaphylaxis.   famotidine  40 MG tablet Commonly known as: PEPCID  TAKE ONE TABLET BY MOUTH TWICE A DAY   losartan 100 MG tablet Commonly known as: COZAAR Take 1 tablet by mouth daily.   Melatonin 3 MG Caps Take by mouth.   methylPREDNISolone  4 MG Tbpk tablet Commonly known as: MEDROL  DOSEPAK Take per package instructions   montelukast  10 MG tablet Commonly known as: SINGULAIR  Take 1 tablet (10 mg total) by mouth daily.   pantoprazole  40 MG tablet Commonly known as: PROTONIX  Take 1 tablet (40 mg total) by mouth 2 (two) times daily.   sertraline  100 MG tablet Commonly known as: ZOLOFT  Take 100 mg by mouth daily.   terbinafine  250 MG tablet Commonly known as: LamISIL  Take 1 tablet (250 mg total) by mouth daily.  valACYclovir  500 MG tablet Commonly known as: VALTREX  Take by mouth.   Wegovy 0.25 MG/0.5ML Soaj SQ injection Generic drug: semaglutide-weight management Inject into the skin.   Xyzal Allergy 24HR 5 MG tablet Generic drug: levocetirizine        Allergies:  Allergies  Allergen Reactions   Gadolinium Derivatives Anaphylaxis   Iodinated Contrast Media Hives, Shortness Of Breath, Swelling and Anaphylaxis   Iodine Anaphylaxis, Hives, Shortness Of Breath and Swelling   Other Hives    Pea causes diarrhea  Apples cause hives   Citalopram  Other (See Comments)    Nausea, myalgias   Corn-Containing Products     Other reaction(s): Other (See Comments) Is sensitive to this   Egg-Derived  Products Diarrhea   Gluten Meal Diarrhea    Celiac's disease   Molds & Smuts Hives   Pea Diarrhea   Apple Hives    Family History: Family History  Problem Relation Age of Onset   Diabetes Mother    Hypertension Mother    Heart disease Mother    Allergic rhinitis Mother    Hypertension Father    Heart disease Father    Prostate cancer Father    Diabetes Father    Diabetes Brother    ADD / ADHD Brother    Asthma Brother    Allergic rhinitis Brother    ADD / ADHD Brother    Allergic rhinitis Brother    Allergic rhinitis Brother    Asthma Maternal Grandmother    Breast cancer Neg Hx    Colon cancer Neg Hx    Ovarian cancer Neg Hx     Social History:  reports that she quit smoking about 28 years ago. Her smoking use included cigarettes. She started smoking about 31 years ago. She has a 3 pack-year smoking history. She has never used smokeless tobacco. She reports current alcohol use of about 7.0 - 14.0 standard drinks of alcohol per week. She reports that she does not use drugs.  ROS:                                        Physical Exam: BP 126/71   Pulse 84    Laboratory Data: Lab Results  Component Value Date   WBC 7.8 10/03/2020   HGB 10.8 (L) 10/03/2020   HCT 35.3 10/03/2020   MCV 87 10/03/2020   PLT 292 10/03/2020    Lab Results  Component Value Date   CREATININE 0.80 08/06/2019    No results found for: PSA  No results found for: TESTOSTERONE  Lab Results  Component Value Date   HGBA1C 5.7 (H) 04/10/2020    Urinalysis    Component Value Date/Time   COLORURINE STRAW (A) 10/18/2018 2325   APPEARANCEUR CLEAR (A) 10/18/2018 2325   LABSPEC 1.002 (L) 10/18/2018 2325   PHURINE 6.0 10/18/2018 2325   GLUCOSEU NEGATIVE 10/18/2018 2325   HGBUR MODERATE (A) 10/18/2018 2325   BILIRUBINUR Negative 09/02/2020 1528   KETONESUR NEGATIVE 10/18/2018 2325   PROTEINUR Negative 09/02/2020 1528   PROTEINUR NEGATIVE 10/18/2018 2325    UROBILINOGEN 0.2 09/02/2020 1528   NITRITE Negative 09/02/2020 1528   NITRITE NEGATIVE 10/18/2018 2325   LEUKOCYTESUR Negative 09/02/2020 1528    Pertinent Imaging: Urine reviewed and sent for culture.  Chart reviewed  Assessment & Plan: Patient has urge incontinence and mild stress incontinence.  I will have her come back for  pelvic examination and cystoscopy in 6 weeks on Gemtesa  samples and prescription.  Call if culture positive.  Her symptoms are milder and I will offer physical therapy in the future.  She may or may not need urodynamics in the future.  Patient wondered that she might have interstitial cystitis based on other comorbidities but she has no symptoms of this and this was discussed.  I mention urodynamics without detail  1. Urinary frequency (Primary)  - Urinalysis, Complete  2. Urinary urgency  - Urinalysis, Complete  3. Urinary tract infection without hematuria, site unspecified  - Urinalysis, Complete   No follow-ups on file.  Brenda DELENA Elizabeth, MD  [facility] [address] [place], [place] [address] [phone]

## 2024-08-20 NOTE — Patient Instructions (Signed)

## 2024-08-23 LAB — CULTURE, URINE COMPREHENSIVE

## 2024-09-05 ENCOUNTER — Encounter (INDEPENDENT_AMBULATORY_CARE_PROVIDER_SITE_OTHER): Payer: Self-pay | Admitting: Nurse Practitioner

## 2024-09-05 ENCOUNTER — Ambulatory Visit (INDEPENDENT_AMBULATORY_CARE_PROVIDER_SITE_OTHER): Admitting: Nurse Practitioner

## 2024-09-05 VITALS — BP 164/77 | HR 70 | Resp 18 | Ht 64.0 in | Wt 297.6 lb

## 2024-09-05 DIAGNOSIS — I10 Essential (primary) hypertension: Secondary | ICD-10-CM

## 2024-09-05 DIAGNOSIS — M7989 Other specified soft tissue disorders: Secondary | ICD-10-CM | POA: Diagnosis not present

## 2024-09-05 DIAGNOSIS — I872 Venous insufficiency (chronic) (peripheral): Secondary | ICD-10-CM | POA: Diagnosis not present

## 2024-09-10 ENCOUNTER — Ambulatory Visit: Admitting: Urology

## 2024-09-11 ENCOUNTER — Other Ambulatory Visit (INDEPENDENT_AMBULATORY_CARE_PROVIDER_SITE_OTHER): Payer: Self-pay | Admitting: Nurse Practitioner

## 2024-09-11 DIAGNOSIS — M7989 Other specified soft tissue disorders: Secondary | ICD-10-CM

## 2024-09-12 ENCOUNTER — Ambulatory Visit (INDEPENDENT_AMBULATORY_CARE_PROVIDER_SITE_OTHER): Admitting: Nurse Practitioner

## 2024-09-12 ENCOUNTER — Encounter (INDEPENDENT_AMBULATORY_CARE_PROVIDER_SITE_OTHER)

## 2024-09-13 ENCOUNTER — Other Ambulatory Visit
Admission: RE | Admit: 2024-09-13 | Discharge: 2024-09-13 | Disposition: A | Discharge status: Home / Self Care | Source: Ambulatory Visit | Attending: Sports Medicine | Admitting: Sports Medicine

## 2024-09-13 DIAGNOSIS — M25462 Effusion, left knee: Secondary | ICD-10-CM | POA: Insufficient documentation

## 2024-09-13 LAB — SYNOVIAL CELL COUNT + DIFF, W/ CRYSTALS
Crystals, Fluid: NONE SEEN
Eosinophils-Synovial: 0 %
Lymphocytes-Synovial Fld: 44 %
Monocyte-Macrophage-Synovial Fluid: 50 %
Neutrophil, Synovial: 6 %
WBC, Synovial: 111 /mm3 (ref 0–200)

## 2024-09-26 ENCOUNTER — Encounter (INDEPENDENT_AMBULATORY_CARE_PROVIDER_SITE_OTHER): Payer: Self-pay | Admitting: Nurse Practitioner

## 2024-09-26 NOTE — Progress Notes (Signed)
 Subjective:    Patient ID: Brenda Ross, female    DOB: 06-11-1976, 48 y.o.   MRN: [id number] Chief Complaint  Patient presents with   New Patient (Initial Visit)    Ref [facility] consult leg swelling    The patient presents today for lower extremity swelling.  She was referred by her primary care provider.  We have previously seen the patient for previous issues in 2018.  At this time currently there is no open wounds or ulcerations.    Review of Systems  Cardiovascular:  Positive for leg swelling.  All other systems reviewed and are negative.      Objective:   Physical Exam Vitals reviewed.  HENT:     Head: Normocephalic.  Cardiovascular:     Rate and Rhythm: Normal rate.     Pulses: Normal pulses.  Pulmonary:     Effort: Pulmonary effort is normal.  Skin:    General: Skin is warm and dry.  Neurological:     Mental Status: She is alert and oriented to person, place, and time.  Psychiatric:        Mood and Affect: Mood normal.        Behavior: Behavior normal.        Thought Content: Thought content normal.        Judgment: Judgment normal.     BP (!) 164/77   Pulse 70   Resp 18   Ht 5' 4 (1.626 m)   Wt 297 lb 9.6 oz (135 kg)   BMI 51.08 kg/m   Past Medical History:  Diagnosis Date   Allergic rhinitis    Anemia    Anxiety    GERD (gastroesophageal reflux disease)    Herpes genitalia    Prediabetes    Sjogren's disease    Sleep apnea     Social History   Socioeconomic History   Marital status: Married    Spouse name: Not on file   Number of children: 1   Years of education: Not on file   Highest education level: Not on file  Occupational History    Employer: [employer]  Tobacco Use   Smoking status: Former    Current packs/day: 0.00    Average packs/day: 1 pack/day for 3.0 years (3.0 ttl pk-yrs)    Types: Cigarettes    Start date: 01/13/1993    Quit date: 01/14/1996    Years since quitting: 28.7   Smokeless tobacco: Never  Vaping Use    Vaping status: Never Used  Substance and Sexual Activity   Alcohol use: Yes    Alcohol/week: 7.0 - 14.0 standard drinks of alcohol    Types: 7 - 14 Glasses of wine per week   Drug use: Never   Sexual activity: Yes    Partners: Male    Birth control/protection: I.U.D.  Other Topics Concern   Not on file  Social History Narrative   Not on file   Social Drivers of Health   Financial Resource Strain: Low Risk  (09/13/2024)   Received from [facility]   Overall Financial Resource Strain (CARDIA)    Difficulty of Paying Living Expenses: Not very hard  Food Insecurity: Food Insecurity Present (09/13/2024)   Received from [facility]   Hunger Vital Sign    Within the past 12 months, you worried that your food would run out before you got the money to buy more.: Never true    Within the past 12 months, the  food you bought just didn't last and you didn't have money to get more.: Sometimes true  Transportation Needs: No Transportation Needs (09/13/2024)   Received from [facility] - Transportation    In the past 12 months, has lack of transportation kept you from medical appointments or from getting medications?: No    Lack of Transportation (Non-Medical): No  Physical Activity: Unknown (07/31/2024)   Received from [facility]   Exercise Vital Sign    On average, how many days per week do you engage in moderate to strenuous exercise (like a brisk walk)?: Patient declined    Minutes of Exercise per Session: Not on file  Stress: No Stress Concern Present (07/31/2024)   Received from [facility] - Occupational Stress Questionnaire    Do you feel stress - tense, restless, nervous, or anxious, or unable to sleep at night because your mind is troubled all the time - these days?: Not at all  Social Connections: Unknown (04/27/2022)   Received from [facility]   Social Network    Social  Network: Not on file  Intimate Partner Violence: Not At Risk (09/04/2024)   Received from [facility]   Humiliation, Afraid, Rape, and Kick questionnaire    Within the last year, have you been afraid of your partner or ex-partner?: No    Within the last year, have you been humiliated or emotionally abused in other ways by your partner or ex-partner?: No    Within the last year, have you been kicked, hit, slapped, or otherwise physically hurt by your partner or ex-partner?: No    Within the last year, have you been raped or forced to have any kind of sexual activity by your partner or ex-partner?: No  Recent Concern: Intimate Partner Violence - At Risk (07/31/2024)   Received from [facility]   HITS    Over the last 12 months how often did your partner physically hurt you?: Never    Over the last 12 months how often did your partner insult you or talk down to you?: Frequently    Over the last 12 months how often did your partner threaten you with physical harm?: Never    Over the last 12 months how often did your partner scream or curse at you?: Frequently    Past Surgical History:  Procedure Laterality Date   APPENDECTOMY      Family History  Problem Relation Age of Onset   Diabetes Mother    Hypertension Mother    Heart disease Mother    Allergic rhinitis Mother    Hypertension Father    Heart disease Father    Prostate cancer Father    Diabetes Father    Diabetes Brother    ADD / ADHD Brother    Asthma Brother    Allergic rhinitis Brother    ADD / ADHD Brother    Allergic rhinitis Brother    Allergic rhinitis Brother    Asthma Maternal Grandmother    Breast cancer Neg Hx    Colon cancer Neg Hx    Ovarian cancer Neg Hx     Allergies  Allergen Reactions   Gadolinium Derivatives Anaphylaxis   Iodinated Contrast Media Hives, Shortness Of Breath, Swelling and Anaphylaxis   Iodine Anaphylaxis, Hives, Shortness Of Breath and Swelling   Other Hives    Pea causes  diarrhea  Apples cause hives   Citalopram  Other (See Comments)  Nausea, myalgias   Corn-Containing Products     Other reaction(s): Other (See Comments) Is sensitive to this   Egg Protein-Containing Drug Products Diarrhea   Gluten Meal Diarrhea    Celiac's disease   Molds & Smuts Hives   Pea Diarrhea   Apple Hives       Latest Ref Rng & Units 10/03/2020    1:12 PM 04/10/2020   12:03 PM 12/21/2019    1:56 PM  CBC  WBC 3.4 - 10.8 x10E3/uL 7.8  7.7  9.6   Hemoglobin 11.1 - 15.9 g/dL 89.1  88.4  88.2   Hematocrit 34.0 - 46.6 % 35.3  35.1  36.9   Platelets 150 - 450 x10E3/uL 292  257  262       CMP     Component Value Date/Time   NA 141 08/06/2019 0918   K 4.2 08/06/2019 0918   CL 104 08/06/2019 0918   CO2 21 08/06/2019 0918   GLUCOSE 105 (H) 08/06/2019 0918   GLUCOSE 143 (H) 10/18/2018 2303   BUN 11 08/06/2019 0918   CREATININE 0.80 08/06/2019 0918   CALCIUM 9.2 08/06/2019 0918   PROT 7.0 12/21/2019 1356   ALBUMIN 4.4 12/21/2019 1356   AST 17 12/21/2019 1356   ALT 24 12/21/2019 1356   ALKPHOS 81 12/21/2019 1356   BILITOT 0.5 12/21/2019 1356   GFRNONAA 90 08/06/2019 0918     No results found.     Assessment & Plan:   1. Leg swelling (Primary) The patient will move forward with conservative therapies including use of medical grade compression, elevation and activity.  As noted below we will have the patient return for venous reflux studies in order to better evaluate her lower extremity edema  2. Chronic venous insufficiency The patient has had a previous history of venous insufficiency.  Based on this we will have her return for repeat duplex studies to determine if there are possible causes for her edema.  3. Benign essential hypertension Continue antihypertensive medications as already ordered, these medications have been reviewed and there are no changes at this time.   Current Outpatient Medications on File Prior to Visit  Medication Sig Dispense Refill    buPROPion (WELLBUTRIN) 100 MG tablet Take 100 mg by mouth 1 day or 1 dose.     clotrimazole -betamethasone  (LOTRISONE ) cream Apply 1 Application topically 2 (two) times daily. 45 g 2   cromolyn (GASTROCROM) 100 MG/5ML solution Take 200 mg by mouth.     diltiazem (CARDIZEM CD) 180 MG 24 hr capsule Take 180 mg by mouth daily.     DULoxetine HCl 40 MG CPEP      EPINEPHrine  0.3 mg/0.3 mL IJ SOAJ injection Inject 0.3 mLs (0.3 mg total) into the muscle as needed for anaphylaxis. 2 each 1   famotidine  (PEPCID ) 40 MG tablet TAKE ONE TABLET BY MOUTH TWICE A DAY 60 tablet 1   Fezolinetant 45 MG TABS Take 45 mg by mouth.     fluticasone (FLONASE) 50 MCG/ACT nasal spray Place 1 spray into the nose.     hydroxychloroquine (PLAQUENIL) 200 MG tablet Take 200 mg by mouth.     hydrOXYzine  (ATARAX ) 10 MG tablet      ketoconazole  (NIZORAL ) 2 % cream SMARTSIG:2 Topical Twice Daily     levocetirizine (XYZAL ALLERGY 24HR) 5 MG tablet      losartan (COZAAR) 100 MG tablet Take 1 tablet by mouth daily.     Melatonin 3 MG CAPS Take by mouth.  metformin (FORTAMET) 500 MG (OSM) 24 hr tablet Take 500 mg by mouth 2 (two) times daily with a meal.     methylPREDNISolone  (MEDROL  DOSEPAK) 4 MG TBPK tablet Take per package instructions 21 tablet 0   montelukast  (SINGULAIR ) 10 MG tablet Take 1 tablet (10 mg total) by mouth daily. 90 tablet 1   Naftifine HCl 2 % CREA SMARTSIG:2 Topical Twice Daily     ondansetron  (ZOFRAN ) 4 MG tablet Take 4 mg by mouth.     pantoprazole  (PROTONIX ) 40 MG tablet Take 1 tablet (40 mg total) by mouth 2 (two) times daily. 60 tablet 6   sertraline  (ZOLOFT ) 100 MG tablet Take 100 mg by mouth daily.     terbinafine  (LAMISIL ) 250 MG tablet Take 1 tablet (250 mg total) by mouth daily. 90 tablet 0   valACYclovir  (VALTREX ) 500 MG tablet Take by mouth.     Vibegron  (GEMTESA ) 75 MG TABS Take 1 tablet (75 mg total) by mouth daily. 28 tablet    Vibegron  (GEMTESA ) 75 MG TABS Take 1 tablet (75 mg total) by  mouth daily. 30 tablet 11   WEGOVY 0.25 MG/0.5ML SOAJ Inject into the skin.     No current facility-administered medications on file prior to visit.    There are no Patient Instructions on file for this visit. No follow-ups on file.   Mccall Lomax E Keena Heesch, NP

## 2024-11-12 ENCOUNTER — Ambulatory Visit: Admitting: Urology

## 2024-11-12 VITALS — BP 128/79 | HR 71

## 2024-11-12 DIAGNOSIS — N3946 Mixed incontinence: Secondary | ICD-10-CM

## 2024-11-12 MED ORDER — GEMTESA 75 MG PO TABS: 75.0000 mg | ORAL_TABLET | Freq: Every day | ORAL | Status: AC

## 2024-11-12 NOTE — Progress Notes (Signed)
 11/12/2024 10:41 AM   Brenda Ross [birth year] [id number]  Referring provider: Sherial Bail, MD [address] [place],  [place] [address]  Chief Complaint  Patient presents with   Cysto    HPI: I was consulted to assess patient's urinary incontinence.  In the last year she has urge incontinence especially foot on the floor syndrome first thing in the morning and when she goes from sitting to standing position.  She does not wear a pad but has to change her close.  Sometimes leaks with coughing sneezing bending and lifting.  Voids every 2-3 hours and has no nocturia   Flow is good.   Use a cane for autoimmune Sjogren syndrome and fibromyalgia.  Has some knee issues.  No hysterectomy   Renal ultrasound and CT stone protocol in 2025 normal    Patient has urge incontinence and mild stress incontinence.  I will have her come back for pelvic examination and cystoscopy in 6 weeks on Gemtesa  samples and prescription.  Call if culture positive.  Her symptoms are milder and I will offer physical therapy in the future.  She may or may not need urodynamics in the future.  Patient wondered that she might have interstitial cystitis based on other comorbidities but she has no symptoms of this and this was discussed.  I mention urodynamics without detail   Today Frequency stable.  Last culture negative On pelvic examination patient had grade 2 hypermobility the bladder neck with rotational descent but no stress incontinence.  No significant prolapse. I spent many moments trying to insert the cystoscope.  She had mild meatal stenosis not able to insert.  I did not try to dilate her.  No blood in urine on urinalysis  Patient actually did well on Gemtesa  but is not very compliant.  Clinically not infected today.     PMH: Past Medical History:  Diagnosis Date   Allergic rhinitis    Anemia    Anxiety    GERD (gastroesophageal reflux disease)    Herpes genitalia    Prediabetes     Sjogren's disease    Sleep apnea     Surgical History: Past Surgical History:  Procedure Laterality Date   APPENDECTOMY      Home Medications:  Allergies as of 11/12/2024       Reactions   Gadolinium Derivatives Anaphylaxis   Iodinated Contrast Media Hives, Shortness Of Breath, Swelling, Anaphylaxis   Iodine Anaphylaxis, Hives, Shortness Of Breath, Swelling   Other Hives   Pea causes diarrhea  Apples cause hives   Apple Fruit Extract Hives   Citalopram  Other (See Comments)   Nausea, myalgias   Corn-containing Products    Other reaction(s): Other (See Comments) Is sensitive to this   Egg Protein-containing Drug Products Diarrhea   Gluten Meal Diarrhea   Celiac's disease   Molds & Smuts Hives   Pea Diarrhea   Apple Hives        Medication List        Accurate as of November 12, 2024 10:41 AM. If you have any questions, ask your nurse or doctor.          buPROPion 100 MG tablet Commonly known as: WELLBUTRIN Take 100 mg by mouth 1 day or 1 dose.   clotrimazole -betamethasone  cream Commonly known as: Lotrisone  Apply 1 Application topically 2 (two) times daily.   cromolyn 100 MG/5ML solution Commonly known as: GASTROCROM Take 200 mg by mouth.   diazepam 2 MG tablet Commonly known  as: VALIUM   diltiazem 180 MG 24 hr capsule Commonly known as: CARDIZEM CD Take 180 mg by mouth daily.   DULoxetine HCl 40 MG Cpep   EPINEPHrine  0.3 mg/0.3 mL Soaj injection Commonly known as: EPI-PEN Inject 0.3 mLs (0.3 mg total) into the muscle as needed for anaphylaxis.   famotidine  40 MG tablet Commonly known as: PEPCID  TAKE ONE TABLET BY MOUTH TWICE A DAY   Fezolinetant 45 MG Tabs Take 45 mg by mouth.   fluticasone 50 MCG/ACT nasal spray Commonly known as: FLONASE Place 1 spray into the nose.   gabapentin 100 MG capsule Commonly known as: NEURONTIN   Gemtesa  75 MG Tabs Generic drug: Vibegron  Take 1 tablet (75 mg total) by mouth daily.   Gemtesa  75 MG  Tabs Generic drug: Vibegron  Take 1 tablet (75 mg total) by mouth daily.   hydroxychloroquine 200 MG tablet Commonly known as: PLAQUENIL Take 200 mg by mouth.   hydrOXYzine  10 MG tablet Commonly known as: ATARAX    ibuprofen 800 MG tablet Commonly known as: ADVIL TAKE 1 TABLET BY MOUTH 3 TIMES A DAY BY MOUTH WITH MEALS FOR 7 DAYS.   ketoconazole  2 % cream Commonly known as: NIZORAL  SMARTSIG:2 Topical Twice Daily   losartan 100 MG tablet Commonly known as: COZAAR Take 1 tablet by mouth daily.   Melatonin 3 MG Caps Take by mouth.   metformin 500 MG (OSM) 24 hr tablet Commonly known as: FORTAMET Take 500 mg by mouth 2 (two) times daily with a meal.   methocarbamol 500 MG tablet Commonly known as: ROBAXIN Take 1 tablet every 8 hours by oral route as needed, for muscle spasms.   methylPREDNISolone  4 MG Tbpk tablet Commonly known as: MEDROL  DOSEPAK Take per package instructions   montelukast  10 MG tablet Commonly known as: SINGULAIR  Take 1 tablet (10 mg total) by mouth daily.   mupirocin ointment 2 % Commonly known as: BACTROBAN SMARTSIG:1 Application Topical 2-3 Times Daily   Naftifine HCl 2 % Crea SMARTSIG:2 Topical Twice Daily   ondansetron  4 MG tablet Commonly known as: ZOFRAN  Take 4 mg by mouth.   pantoprazole  40 MG tablet Commonly known as: PROTONIX  Take 1 tablet (40 mg total) by mouth 2 (two) times daily.   sertraline  100 MG tablet Commonly known as: ZOLOFT  Take 100 mg by mouth daily.   terbinafine  250 MG tablet Commonly known as: LamISIL  Take 1 tablet (250 mg total) by mouth daily.   tiZANidine 4 MG tablet Commonly known as: ZANAFLEX Take 1 tablet every 6 hours by oral route for 30 days.   valACYclovir  500 MG tablet Commonly known as: VALTREX  Take by mouth.   Vitamin D  (Ergocalciferol ) 1.25 MG (50000 UNIT) Caps capsule Commonly known as: DRISDOL  Take 50,000 Units by mouth once a week.   Wegovy 0.25 MG/0.5ML Soaj SQ injection Generic drug:  semaglutide-weight management Inject into the skin.   Xyzal Allergy 24HR 5 MG tablet Generic drug: levocetirizine   Zepbound 12.5 MG/0.5ML Pen Generic drug: tirzepatide   Zepbound 10 MG/0.5ML Pen Generic drug: tirzepatide   Zepbound 2.5 MG/0.5ML Pen Generic drug: tirzepatide Inject 2.5 mg into the skin.        Allergies:  Allergies  Allergen Reactions   Gadolinium Derivatives Anaphylaxis   Iodinated Contrast Media Hives, Shortness Of Breath, Swelling and Anaphylaxis   Iodine Anaphylaxis, Hives, Shortness Of Breath and Swelling   Other Hives    Pea causes diarrhea  Apples cause hives   Apple Fruit Extract Hives  Citalopram  Other (See Comments)    Nausea, myalgias   Corn-Containing Products     Other reaction(s): Other (See Comments) Is sensitive to this   Egg Protein-Containing Drug Products Diarrhea   Gluten Meal Diarrhea    Celiac's disease   Molds & Smuts Hives   Pea Diarrhea   Apple Hives    Family History: Family History  Problem Relation Age of Onset   Diabetes Mother    Hypertension Mother    Heart disease Mother    Allergic rhinitis Mother    Hypertension Father    Heart disease Father    Prostate cancer Father    Diabetes Father    Diabetes Brother    ADD / ADHD Brother    Asthma Brother    Allergic rhinitis Brother    ADD / ADHD Brother    Allergic rhinitis Brother    Allergic rhinitis Brother    Asthma Maternal Grandmother    Breast cancer Neg Hx    Colon cancer Neg Hx    Ovarian cancer Neg Hx     Social History:  reports that she quit smoking about 28 years ago. Her smoking use included cigarettes. She started smoking about 31 years ago. She has a 3 pack-year smoking history. She has never used smokeless tobacco. She reports current alcohol use of about 7.0 - 14.0 standard drinks of alcohol per week. She reports that she does not use drugs.  ROS:                                        Physical Exam: There  were no vitals taken for this visit.  Constitutional:  Alert and oriented, No acute distress. HEENT: [place] AT, moist mucus membranes.  Trachea midline, no masses.   Laboratory Data: Lab Results  Component Value Date   WBC 7.8 10/03/2020   HGB 10.8 (L) 10/03/2020   HCT 35.3 10/03/2020   MCV 87 10/03/2020   PLT 292 10/03/2020    Lab Results  Component Value Date   CREATININE 0.80 08/06/2019    No results found for: PSA  No results found for: TESTOSTERONE  Lab Results  Component Value Date   HGBA1C 5.7 (H) 04/10/2020    Urinalysis    Component Value Date/Time   COLORURINE STRAW (A) 10/18/2018 2325   APPEARANCEUR Clear 08/20/2024 0957   LABSPEC 1.002 (L) 10/18/2018 2325   PHURINE 6.0 10/18/2018 2325   GLUCOSEU Negative 08/20/2024 0957   HGBUR MODERATE (A) 10/18/2018 2325   BILIRUBINUR Negative 08/20/2024 0957   KETONESUR NEGATIVE 10/18/2018 2325   PROTEINUR Negative 08/20/2024 0957   PROTEINUR NEGATIVE 10/18/2018 2325   UROBILINOGEN 0.2 09/02/2020 1528   NITRITE Negative 08/20/2024 0957   NITRITE NEGATIVE 10/18/2018 2325   LEUKOCYTESUR Negative 08/20/2024 0957    Pertinent Imaging:   Assessment & Plan: I urged the patient continue with the Gemtesa .  Reassess on Gemtesa  in 3 months.  Hold off on further testing at this stage.  Physical therapy offered  1. Mixed incontinence (Primary)  - Urinalysis, Complete   No follow-ups on file.  Brenda DELENA Elizabeth, MD  [facility] [address] [place], [place] [address] [phone]

## 2024-11-12 NOTE — Addendum Note (Signed)
 Addended byBETHA CORIE PLATER on: 11/12/2024 11:22 AM   Modules accepted: Orders

## 2024-11-13 LAB — URINALYSIS, COMPLETE
Bilirubin, UA: NEGATIVE
Glucose, UA: NEGATIVE
Ketones, UA: NEGATIVE
Leukocytes,UA: NEGATIVE
Nitrite, UA: NEGATIVE
Protein,UA: NEGATIVE
RBC, UA: NEGATIVE
Specific Gravity, UA: 1.02 (ref 1.005–1.030)
Urobilinogen, Ur: 1 mg/dL (ref 0.2–1.0)
pH, UA: 6 (ref 5.0–7.5)

## 2024-11-13 LAB — MICROSCOPIC EXAMINATION: Bacteria, UA: NONE SEEN

## 2024-11-26 ENCOUNTER — Encounter: Payer: Self-pay | Admitting: Urology

## 2024-11-29 NOTE — Telephone Encounter (Signed)
 P't insurance denied Gemtesa , alternative options are myrbetriq or toviaz, please advise.

## 2024-11-30 MED ORDER — MIRABEGRON ER 50 MG PO TB24: 50.0000 mg | ORAL_TABLET | Freq: Every day | ORAL | 11 refills | Status: AC

## 2025-01-09 ENCOUNTER — Encounter (INDEPENDENT_AMBULATORY_CARE_PROVIDER_SITE_OTHER)

## 2025-01-09 ENCOUNTER — Ambulatory Visit (INDEPENDENT_AMBULATORY_CARE_PROVIDER_SITE_OTHER): Admitting: Nurse Practitioner

## 2025-02-11 ENCOUNTER — Ambulatory Visit: Admitting: Urology
